# Patient Record
Sex: Female | Born: 1960 | Race: White | Hispanic: No | Marital: Single | Smoking: Former smoker
Health system: Southern US, Community
[De-identification: ages and names within clinical notes are randomized; demographics above are authoritative.]

## PROBLEM LIST (undated history)

## (undated) DIAGNOSIS — F419 Anxiety disorder, unspecified: Secondary | ICD-10-CM

## (undated) DIAGNOSIS — M1990 Unspecified osteoarthritis, unspecified site: Secondary | ICD-10-CM

## (undated) DIAGNOSIS — F32A Depression, unspecified: Secondary | ICD-10-CM

## (undated) DIAGNOSIS — F329 Major depressive disorder, single episode, unspecified: Secondary | ICD-10-CM

## (undated) DIAGNOSIS — I10 Essential (primary) hypertension: Secondary | ICD-10-CM

## (undated) DIAGNOSIS — I639 Cerebral infarction, unspecified: Secondary | ICD-10-CM

## (undated) DIAGNOSIS — I671 Cerebral aneurysm, nonruptured: Secondary | ICD-10-CM

## (undated) DIAGNOSIS — F1020 Alcohol dependence, uncomplicated: Secondary | ICD-10-CM

## (undated) DIAGNOSIS — E785 Hyperlipidemia, unspecified: Secondary | ICD-10-CM

## (undated) HISTORY — DX: Cerebral aneurysm, nonruptured: I67.1

## (undated) HISTORY — DX: Cerebral infarction, unspecified: I63.9

## (undated) HISTORY — DX: Alcohol dependence, uncomplicated: F10.20

## (undated) HISTORY — PX: GYNECOLOGIC CRYOSURGERY: SHX857

## (undated) HISTORY — DX: Unspecified osteoarthritis, unspecified site: M19.90

## (undated) HISTORY — PX: ELBOW FRACTURE SURGERY: SHX616

## (undated) HISTORY — DX: Hyperlipidemia, unspecified: E78.5

## (undated) HISTORY — DX: Essential (primary) hypertension: I10

---

## 1999-02-28 ENCOUNTER — Other Ambulatory Visit: Admission: RE | Admit: 1999-02-28 | Discharge: 1999-02-28 | Payer: Self-pay | Admitting: Obstetrics and Gynecology

## 2000-05-30 ENCOUNTER — Other Ambulatory Visit: Admission: RE | Admit: 2000-05-30 | Discharge: 2000-05-30 | Payer: Self-pay | Admitting: Obstetrics and Gynecology

## 2016-05-20 ENCOUNTER — Emergency Department (HOSPITAL_COMMUNITY): Payer: No Typology Code available for payment source

## 2016-05-20 ENCOUNTER — Encounter (HOSPITAL_COMMUNITY): Payer: Self-pay | Admitting: Emergency Medicine

## 2016-05-20 ENCOUNTER — Emergency Department (HOSPITAL_COMMUNITY)
Admission: EM | Admit: 2016-05-20 | Discharge: 2016-05-20 | Disposition: A | Discharge status: Home / Self Care | Payer: No Typology Code available for payment source | Attending: Emergency Medicine | Admitting: Emergency Medicine

## 2016-05-20 DIAGNOSIS — S8001XA Contusion of right knee, initial encounter: Secondary | ICD-10-CM | POA: Insufficient documentation

## 2016-05-20 DIAGNOSIS — S40211A Abrasion of right shoulder, initial encounter: Secondary | ICD-10-CM | POA: Insufficient documentation

## 2016-05-20 DIAGNOSIS — Y999 Unspecified external cause status: Secondary | ICD-10-CM | POA: Insufficient documentation

## 2016-05-20 DIAGNOSIS — S8991XA Unspecified injury of right lower leg, initial encounter: Secondary | ICD-10-CM | POA: Diagnosis present

## 2016-05-20 DIAGNOSIS — Y9339 Activity, other involving climbing, rappelling and jumping off: Secondary | ICD-10-CM | POA: Insufficient documentation

## 2016-05-20 DIAGNOSIS — S8012XA Contusion of left lower leg, initial encounter: Secondary | ICD-10-CM

## 2016-05-20 DIAGNOSIS — F17200 Nicotine dependence, unspecified, uncomplicated: Secondary | ICD-10-CM | POA: Diagnosis not present

## 2016-05-20 DIAGNOSIS — S7002XA Contusion of left hip, initial encounter: Secondary | ICD-10-CM | POA: Insufficient documentation

## 2016-05-20 DIAGNOSIS — M79604 Pain in right leg: Secondary | ICD-10-CM

## 2016-05-20 DIAGNOSIS — Y92830 Public park as the place of occurrence of the external cause: Secondary | ICD-10-CM | POA: Insufficient documentation

## 2016-05-20 HISTORY — DX: Depression, unspecified: F32.A

## 2016-05-20 HISTORY — DX: Major depressive disorder, single episode, unspecified: F32.9

## 2016-05-20 MED ORDER — IBUPROFEN 200 MG PO TABS
600.0000 mg | ORAL_TABLET | Freq: Once | ORAL | Status: AC
  Administered 2016-05-20: 600 mg via ORAL
  Filled 2016-05-20: qty 1
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
## 2016-05-20 NOTE — Discharge Instructions (Signed)
Follow-up with orthopedics and return to the ER if he develop trouble pain, numbness or worsening symptoms. Elevate as much as possible, ice Tylenol and Motrin for pain.  If you were given medicines take as directed.  If you are on coumadin or contraceptives realize their levels and effectiveness is altered by many different medicines.  If you have any reaction (rash, tongues swelling, other) to the medicines stop taking and see a physician.    If your blood pressure was elevated in the ER make sure you follow up for management with a primary doctor or return for chest pain, shortness of breath or stroke symptoms.  Please follow up as directed and return to the ER or see a physician for new or worsening symptoms.  Thank you. Vitals:   05/20/16 1821 05/20/16 1916 05/20/16 1935  BP: 98/68 107/75 103/62  Pulse: 72 66 75  Resp: 18 14 16   Temp: 98.7 F (37.1 C)    TempSrc: Oral    SpO2: 100% 99% 98%

## 2016-05-20 NOTE — ED Notes (Signed)
Ace bandage applied to R knee.

## 2016-06-25 ENCOUNTER — Ambulatory Visit (HOSPITAL_BASED_OUTPATIENT_CLINIC_OR_DEPARTMENT_OTHER): Payer: Self-pay

## 2016-09-23 ENCOUNTER — Emergency Department (HOSPITAL_COMMUNITY): Payer: Medicaid Other

## 2016-09-23 ENCOUNTER — Inpatient Hospital Stay (HOSPITAL_COMMUNITY)
Admission: EM | Admit: 2016-09-23 | Discharge: 2016-09-26 | DRG: 065 | Disposition: A | Discharge status: Rehabilitation Facility | Payer: Medicaid Other | Attending: Neurology | Admitting: Neurology

## 2016-09-23 ENCOUNTER — Observation Stay (HOSPITAL_COMMUNITY): Payer: Medicaid Other

## 2016-09-23 ENCOUNTER — Encounter (HOSPITAL_COMMUNITY): Payer: Self-pay

## 2016-09-23 DIAGNOSIS — I161 Hypertensive emergency: Secondary | ICD-10-CM | POA: Diagnosis present

## 2016-09-23 DIAGNOSIS — R400 Somnolence: Secondary | ICD-10-CM

## 2016-09-23 DIAGNOSIS — R471 Dysarthria and anarthria: Secondary | ICD-10-CM | POA: Diagnosis present

## 2016-09-23 DIAGNOSIS — F339 Major depressive disorder, recurrent, unspecified: Secondary | ICD-10-CM | POA: Diagnosis present

## 2016-09-23 DIAGNOSIS — S71101D Unspecified open wound, right thigh, subsequent encounter: Secondary | ICD-10-CM

## 2016-09-23 DIAGNOSIS — I619 Nontraumatic intracerebral hemorrhage, unspecified: Secondary | ICD-10-CM | POA: Diagnosis present

## 2016-09-23 DIAGNOSIS — E7800 Pure hypercholesterolemia, unspecified: Secondary | ICD-10-CM | POA: Diagnosis present

## 2016-09-23 DIAGNOSIS — I615 Nontraumatic intracerebral hemorrhage, intraventricular: Principal | ICD-10-CM

## 2016-09-23 DIAGNOSIS — R4701 Aphasia: Secondary | ICD-10-CM | POA: Diagnosis present

## 2016-09-23 DIAGNOSIS — F17210 Nicotine dependence, cigarettes, uncomplicated: Secondary | ICD-10-CM | POA: Diagnosis present

## 2016-09-23 DIAGNOSIS — F1410 Cocaine abuse, uncomplicated: Secondary | ICD-10-CM | POA: Diagnosis present

## 2016-09-23 DIAGNOSIS — I10 Essential (primary) hypertension: Secondary | ICD-10-CM

## 2016-09-23 DIAGNOSIS — Z720 Tobacco use: Secondary | ICD-10-CM | POA: Diagnosis present

## 2016-09-23 DIAGNOSIS — I720 Aneurysm of carotid artery: Secondary | ICD-10-CM | POA: Diagnosis present

## 2016-09-23 DIAGNOSIS — I610 Nontraumatic intracerebral hemorrhage in hemisphere, subcortical: Secondary | ICD-10-CM

## 2016-09-23 DIAGNOSIS — S71109A Unspecified open wound, unspecified thigh, initial encounter: Secondary | ICD-10-CM | POA: Diagnosis present

## 2016-09-23 DIAGNOSIS — G8191 Hemiplegia, unspecified affecting right dominant side: Secondary | ICD-10-CM | POA: Diagnosis present

## 2016-09-23 DIAGNOSIS — F1010 Alcohol abuse, uncomplicated: Secondary | ICD-10-CM | POA: Diagnosis present

## 2016-09-23 DIAGNOSIS — E538 Deficiency of other specified B group vitamins: Secondary | ICD-10-CM | POA: Diagnosis present

## 2016-09-23 DIAGNOSIS — R29810 Facial weakness: Secondary | ICD-10-CM | POA: Diagnosis present

## 2016-09-23 HISTORY — DX: Anxiety disorder, unspecified: F41.9

## 2016-09-23 LAB — COMPREHENSIVE METABOLIC PANEL
ALBUMIN: 3.8 g/dL (ref 3.5–5.0)
ALK PHOS: 83 U/L (ref 38–126)
ALT: 23 U/L (ref 14–54)
AST: 40 U/L (ref 15–41)
Anion gap: 10 (ref 5–15)
BUN: 8 mg/dL (ref 6–20)
CHLORIDE: 100 mmol/L — AB (ref 101–111)
CO2: 26 mmol/L (ref 22–32)
CREATININE: 0.85 mg/dL (ref 0.44–1.00)
Calcium: 9.8 mg/dL (ref 8.9–10.3)
GFR calc non Af Amer: >60 mL/min (ref >60)
GLUCOSE: 89 mg/dL (ref 65–99)
Potassium: 3.5 mmol/L (ref 3.5–5.1)
SODIUM: 136 mmol/L (ref 135–145)
Total Bilirubin: 0.9 mg/dL (ref 0.3–1.2)
Total Protein: 8.1 g/dL (ref 6.5–8.1)

## 2016-09-23 LAB — PROTIME-INR
INR: 0.95
INR: 0.94
PROTHROMBIN TIME: 12.7 s (ref 11.4–15.2)
Prothrombin Time: 12.6 seconds (ref 11.4–15.2)

## 2016-09-23 LAB — URINALYSIS, ROUTINE W REFLEX MICROSCOPIC
BILIRUBIN URINE: NEGATIVE
GLUCOSE, UA: NEGATIVE mg/dL
Ketones, ur: 20 mg/dL — AB
LEUKOCYTES UA: NEGATIVE
NITRITE: NEGATIVE
PH: 6 (ref 5.0–8.0)
Protein, ur: NEGATIVE mg/dL
SPECIFIC GRAVITY, URINE: 1.013 (ref 1.005–1.030)

## 2016-09-23 LAB — I-STAT CHEM 8, ED
BUN: 9 mg/dL (ref 6–20)
CALCIUM ION: 1.19 mmol/L (ref 1.15–1.40)
CHLORIDE: 103 mmol/L (ref 101–111)
Creatinine, Ser: 0.8 mg/dL (ref 0.44–1.00)
Glucose, Bld: 96 mg/dL (ref 65–99)
HCT: 46 % (ref 36.0–46.0)
Hemoglobin: 15.6 g/dL — ABNORMAL HIGH (ref 12.0–15.0)
POTASSIUM: 3.6 mmol/L (ref 3.5–5.1)
SODIUM: 139 mmol/L (ref 135–145)
TCO2: 25 mmol/L (ref 0–100)

## 2016-09-23 LAB — CBC
HCT: 43.5 % (ref 36.0–46.0)
Hemoglobin: 14.8 g/dL (ref 12.0–15.0)
MCH: 32.9 pg (ref 26.0–34.0)
MCHC: 34 g/dL (ref 30.0–36.0)
MCV: 96.7 fL (ref 78.0–100.0)
PLATELETS: 294 10*3/uL (ref 150–400)
RBC: 4.5 MIL/uL (ref 3.87–5.11)
RDW: 14.2 % (ref 11.5–15.5)
WBC: 8.5 10*3/uL (ref 4.0–10.5)

## 2016-09-23 LAB — I-STAT TROPONIN, ED: Troponin i, poc: 0.01 ng/mL (ref 0.00–0.08)

## 2016-09-23 LAB — DIFFERENTIAL
BASOS ABS: 0 10*3/uL (ref 0.0–0.1)
BASOS PCT: 0 %
Eosinophils Absolute: 0.1 10*3/uL (ref 0.0–0.7)
Eosinophils Relative: 1 %
LYMPHS PCT: 25 %
Lymphs Abs: 2.1 10*3/uL (ref 0.7–4.0)
MONO ABS: 0.7 10*3/uL (ref 0.1–1.0)
Monocytes Relative: 8 %
NEUTROS ABS: 5.6 10*3/uL (ref 1.7–7.7)
NEUTROS PCT: 66 %

## 2016-09-23 LAB — RAPID URINE DRUG SCREEN, HOSP PERFORMED
AMPHETAMINES: NOT DETECTED
BARBITURATES: NOT DETECTED
BENZODIAZEPINES: NOT DETECTED
Cocaine: POSITIVE — AB
OPIATES: NOT DETECTED
Tetrahydrocannabinol: NOT DETECTED

## 2016-09-23 LAB — ETHANOL

## 2016-09-23 LAB — APTT
APTT: 32 s (ref 24–36)
APTT: 33 s (ref 24–36)

## 2016-09-23 MED ORDER — ACETAMINOPHEN 160 MG/5ML PO SOLN: 650.0000 mg | ORAL | Status: DC | PRN

## 2016-09-23 MED ORDER — SENNOSIDES-DOCUSATE SODIUM 8.6-50 MG PO TABS
1.0000 | ORAL_TABLET | Freq: Two times a day (BID) | ORAL | Status: DC
  Administered 2016-09-24 – 2016-09-26 (×5): 1 via ORAL
  Filled 2016-09-23 (×5): qty 1

## 2016-09-23 MED ORDER — ACETAMINOPHEN 650 MG RE SUPP: 650.0000 mg | RECTAL | Status: DC | PRN

## 2016-09-23 MED ORDER — PANTOPRAZOLE SODIUM 40 MG IV SOLR
40.0000 mg | Freq: Every day | INTRAVENOUS | Status: DC
  Administered 2016-09-23: 40 mg via INTRAVENOUS
  Filled 2016-09-23: qty 40

## 2016-09-23 MED ORDER — ACETAMINOPHEN 325 MG PO TABS
650.0000 mg | ORAL_TABLET | ORAL | Status: DC | PRN
  Administered 2016-09-25: 650 mg via ORAL
  Filled 2016-09-23: qty 2

## 2016-09-23 MED ORDER — CLEVIDIPINE BUTYRATE 0.5 MG/ML IV EMUL
0.0000 mg/h | INTRAVENOUS | Status: DC
  Administered 2016-09-23 – 2016-09-24 (×3): 2 mg/h via INTRAVENOUS
  Filled 2016-09-23 (×2): qty 50

## 2016-09-23 MED ORDER — STROKE: EARLY STAGES OF RECOVERY BOOK
Freq: Once | Status: AC
  Administered 2016-09-24
  Filled 2016-09-23 (×2): qty 1

## 2016-09-23 NOTE — Consult Note (Signed)
NEURO HOSPITALIST CONSULT NOTE   Requestig physician: Dr. Fredderick PhenixBelfi  Reason for Consult: Left thalamic ICH with intraventricular extension  History obtained from:  Patient and Chart     HPI:                                                                                                                                          Kelsey Smith is an 55 y.o. female who presents with acute left thalamic ICH.   ED notes reviewed, which agree with the history I obtained from the patient: "Her roommate states that she was last seen normal last night around 11:30 PM. One of the other roommates heard her fall during the night and helped her back to bed. This morning her roommate found her on the floor. She wasn't acting normally and was having difficulty with her speech. He brought her in for evaluation. She is having some difficulty with her speech but does seem oriented. She denies any injuries from the fall. She denies any neck or back pain. She is not on anticoagulants."  Other symptoms include headache and aphasia. LSN was 23:30 last night. She states her headache and weakness started before she fell. She was drinking heavily prior to the onset of headache and weakness; estimated total per day is the equivalent of 8 beers. She also smokes.   Past Medical History:  Diagnosis Date  . Depression     History reviewed. No pertinent surgical history.  No family history on file.  Social History:  reports that she has been smoking.  She has never used smokeless tobacco. She reports that she drinks alcohol. She reports that she does not use drugs.  No Known Allergies  MEDICATIONS:                                                                                                                     busPIRone (BUSPAR) 10 MG tablet Take 10 mg by mouth every morning. Historical Provider, MD Needs Review  ibuprofen (ADVIL,MOTRIN) 200 MG tablet Take 800 mg by mouth every 6 (six) hours as  needed for headache or mild pain. Historical Provider, MD Needs Review  PARoxetine (PAXIL) 20 MG tablet Take 20 mg by mouth every morning.  Historical Provider, MD Needs Review  traZODone (DESYREL) 50 MG tablet Take 50 mg by mouth at bedtime as needed for sleep. Historical Provider, MD Needs Review    ROS:                                                                                                                                       History obtained from patient. No fever, chills, cough, chest pain or back pain.   Blood pressure (!) 182/104, pulse 84, temperature 97.7 F (36.5 C), temperature source Oral, resp. rate 18, height 5\' 1"  (1.549 m), weight 47.6 kg (105 lb), SpO2 100 %.  General Examination:                                                                                                      HEENT-  Normocephalic.   Lungs- Respirations unlabored. No gross wheezing.  Extremities- No edema. 2 cm wide oval eschar with surrounding erythema on medial aspect of right knee  Neurological Examination Mental Status: Drowsy. Speech with receptive and expressive dysphasia. Able to repeat normally. Has significant difficulty with naming, comprehension and fluency.  Cranial Nerves: II: Visual fields grossly intact on left and right to single stimuli; extinction on the right with double simultaneous stimulation.   III,IV, VI: Mild right sided ptosis, extra-ocular motions full horizontally and vertically with saccadic quality noted V,VII: Mild right facial droop. Patient equivocal regarding facial temp sensation. VIII: hearing intact to conversation IX,X: no hypophonia XI: lag on right XII: Does not cooperate Motor: Right : Upper extremity   4-/5 and slow to respond  Left:     Upper extremity   5/5  Lower extremity   4+/5     Lower extremity   5/5 Sensory: Decreased sensation on the right.  Deep Tendon Reflexes: 2-3+ and symmetric throughout Plantars: Equivocal bilaterally  Cerebellar:  Dystaxic with right FNF Gait:  Deferred   Lab Results: Basic Metabolic Panel:  Recent Labs Lab 09/23/16 1405 09/23/16 1423  NA 136 139  K 3.5 3.6  CL 100* 103  CO2 26  --   GLUCOSE 89 96  BUN 8 9  CREATININE 0.85 0.80  CALCIUM 9.8  --     Liver Function Tests:  Recent Labs Lab 09/23/16 1405  AST 40  ALT 23  ALKPHOS 83  BILITOT 0.9  PROT 8.1  ALBUMIN 3.8   No results for input(s): LIPASE, AMYLASE in the last 168 hours. No results for input(s): AMMONIA in the last 168 hours.  CBC:  Recent Labs Lab 09/23/16 1405 09/23/16 1423  WBC 8.5  --   NEUTROABS 5.6  --   HGB 14.8 15.6*  HCT 43.5 46.0  MCV 96.7  --   PLT 294  --     Cardiac Enzymes: No results for input(s): CKTOTAL, CKMB, CKMBINDEX, TROPONINI in the last 168 hours.  Lipid Panel: No results for input(s): CHOL, TRIG, HDL, CHOLHDL, VLDL, LDLCALC in the last 168 hours.  CBG: No results for input(s): GLUCAP in the last 168 hours.  Microbiology: No results found for this or any previous visit.  Coagulation Studies:  Recent Labs  09/23/16 1405  LABPROT 12.7  INR 0.95    Imaging: Ct Head Wo Contrast  Result Date: 09/23/2016 CLINICAL DATA:  Larey SeatFell with head trauma, slurred speech and confusion. EXAM: CT HEAD WITHOUT CONTRAST TECHNIQUE: Contiguous axial images were obtained from the base of the skull through the vertex without intravenous contrast. COMPARISON:  None. FINDINGS: Brain: Acute intraparenchymal hemorrhage with the epicenter in the left thalamus measuring 3.5 x 2.9 x 2.9 cm (volume = 15 cm^3). Intraventricular penetration into the left lateral ventricle with subtotal filling of that ventricle and some blood extending into the third and fourth ventricles. Mass effect with left-to-right shift of 3 mm. No evidence of obstructive hydrocephalus. No extra-axial collection. No other abnormal brain parenchymal finding. Vascular:  No abnormal finding at the base of the brain. Skull: Negative  Sinuses/Orbits: Chronic inflammatory changes of the left maxillary sinus. Other: None IMPRESSION: Acute intraparenchymal hemorrhage in the left thalamus with a calculated volume of 15 cc. Intraventricular penetration. Mass effect with 3 mm of left-to-right shift. Critical Value/emergent results were called by telephone at the time of interpretation on 09/23/2016 at 3:00 pm to Dr. Shawna OrleansMELANIE BELFI , who verbally acknowledged these results. Electronically Signed   By: Paulina FusiMark  Shogry M.D.   On: 09/23/2016 15:01   Impression:  Left thalamic ICH with intraventricular extension. 1. Most likely hypertensive. Also possible would be a hemorrhagic underlying lesion.  2. Severe HTN currently. No prior diagnosis of such.  3. Depression.  4. Heavy chronic EtOH use estimated at eight 12 oz beer equivalents per day. 5. ICH score = 1.  Recommendations: 1. Admit to ICU under Neurology service.  2. No antiplatelet medications or anticoagulants. DVT prophylaxis with SCDs. 3. Frequent neuro checks. If she deteriorates, may need Neurosurgery evaluation and/or hot salts. 4. CIWA protocol. Will need scheduled benzo for 2 days, then lower dose x 1 day then PRN.  5. Repeat CT in 24 hours.  6. BP management. SBP goal < 160.  7. MRI brain with and without contrast to evaluate for possible underlying lesion.  8. MRA of head.   Electronically signed: Dr. Caryl PinaEric Cora Brierley 09/23/2016, 4:10 PM

## 2016-09-23 NOTE — ED Notes (Signed)
Contacted Neuro MD about admission orders. Will be placed at this time.

## 2016-09-23 NOTE — ED Provider Notes (Signed)
[facility]-EMERGENCY DEPT Provider Note   CSN: [id number] Arrival date & time: 09/23/16  1330     History   Chief Complaint Chief Complaint  Patient presents with  . Altered Mental Status    HPI Kelsey Smith is a 55 y.o. female.  Patient is a 55 year old female with a history of depression who presents with altered mental status. Her roommate states that she was last seen normal last night around 11:30 PM. One of the other roommates heard her fall during the night and helped her back to bed. This morning her roommate found her on the floor. She wasn't acting normally and was having difficulty with her speech. He brought her in for evaluation. She is having some difficulty with her speech but does seem oriented. She denies any injuries from the fall. She denies any neck or back pain. She is not on anticoagulants.      Past Medical History:  Diagnosis Date  . Depression     There are no active problems to display for this patient.   History reviewed. No pertinent surgical history.  OB History    No data available       Home Medications    Prior to Admission medications   Medication Sig Start Date End Date Taking? Authorizing Provider  busPIRone (BUSPAR) 10 MG tablet Take 10 mg by mouth every morning.   Yes Historical Provider, MD  ibuprofen (ADVIL,MOTRIN) 200 MG tablet Take 800 mg by mouth every 6 (six) hours as needed for headache or mild pain.   Yes Historical Provider, MD  PARoxetine (PAXIL) 20 MG tablet Take 20 mg by mouth every morning.    Yes Historical Provider, MD  traZODone (DESYREL) 50 MG tablet Take 50 mg by mouth at bedtime as needed for sleep.   Yes Historical Provider, MD    Family History No family history on file.  Social History Social History  Substance Use Topics  . Smoking status: Current Every Day Smoker  . Smokeless tobacco: Never Used  . Alcohol use Yes     Allergies   Patient has no known allergies.   Review of Systems Review of  Systems  Constitutional: Negative for chills, diaphoresis, fatigue and fever.  HENT: Negative for congestion, rhinorrhea and sneezing.   Eyes: Negative.   Respiratory: Negative for cough, chest tightness and shortness of breath.   Cardiovascular: Negative for chest pain and leg swelling.  Gastrointestinal: Negative for abdominal pain, blood in stool, diarrhea, nausea and vomiting.  Genitourinary: Negative for difficulty urinating, flank pain, frequency and hematuria.  Musculoskeletal: Negative for arthralgias and back pain.  Skin: Negative for rash.  Neurological: Positive for speech difficulty, weakness and headaches. Negative for dizziness and numbness.     Physical Exam Updated Vital Signs BP (!) 182/104 (BP Location: Right Arm)   Pulse 84   Temp 97.7 F (36.5 C) (Oral)   Resp 18   Ht 5\' 1"  (1.549 m)   Wt 105 lb (47.6 kg)   SpO2 100%   BMI 19.84 kg/m   Physical Exam  Constitutional: She is oriented to person, place, and time. She appears well-developed and well-nourished.  HENT:  Head: Normocephalic and atraumatic.  Eyes: Pupils are equal, round, and reactive to light.  Neck: Normal range of motion. Neck supple.  Cardiovascular: Normal rate, regular rhythm and normal heart sounds.   Pulmonary/Chest: Effort normal and breath sounds normal. No respiratory distress. She has no wheezes. She has no rales. She exhibits no tenderness.  Abdominal: Soft. Bowel sounds are normal. There is no tenderness. There is no rebound and no guarding.  Musculoskeletal: Normal range of motion. She exhibits no edema.  Lymphadenopathy:    She has no cervical adenopathy.  Neurological: She is alert and oriented to person, place, and time.  Patient is oriented to person place and month. She appears to have an expressive aphasia. She has difficulty with certain phrases and words. She doesn't have any definite slurred speech. No facial drooping. She does have some weakness and drift of her right arm  and right leg as compared to the left. She has difficulty with finger to nose on the right. It's normal on the left.  Skin: Skin is warm and dry. No rash noted.  Psychiatric: She has a normal mood and affect.     ED Treatments / Results  Labs (all labs ordered are listed, but only abnormal results are displayed) Labs Reviewed  COMPREHENSIVE METABOLIC PANEL - Abnormal; Notable for the following:       Result Value   Chloride 100 (*)    All other components within normal limits  I-STAT CHEM 8, ED - Abnormal; Notable for the following:    Hemoglobin 15.6 (*)    All other components within normal limits  ETHANOL  PROTIME-INR  APTT  CBC  DIFFERENTIAL  RAPID URINE DRUG SCREEN, HOSP PERFORMED  URINALYSIS, ROUTINE W REFLEX MICROSCOPIC  I-STAT TROPOININ, ED    EKG  EKG Interpretation  Date/Time:  Sunday September 23 2016 13:40:19 EST Ventricular Rate:  72 PR Interval:    QRS Duration: 98 QT Interval:  459 QTC Calculation: 503 R Axis:   63 Text Interpretation:  Sinus rhythm LAE, consider biatrial enlargement LVH with secondary repolarization abnormality Borderline prolonged QT interval No old tracing to compare Confirmed by Dessie Delcarlo  MD, Posey Petrik ([id number]) on 09/23/2016 3:14:00 PM       Radiology Ct Head Wo Contrast  Result Date: 09/23/2016 CLINICAL DATA:  Larey Seat with head trauma, slurred speech and confusion. EXAM: CT HEAD WITHOUT CONTRAST TECHNIQUE: Contiguous axial images were obtained from the base of the skull through the vertex without intravenous contrast. COMPARISON:  None. FINDINGS: Brain: Acute intraparenchymal hemorrhage with the epicenter in the left thalamus measuring 3.5 x 2.9 x 2.9 cm (volume = 15 cm^3). Intraventricular penetration into the left lateral ventricle with subtotal filling of that ventricle and some blood extending into the third and fourth ventricles. Mass effect with left-to-right shift of 3 mm. No evidence of obstructive hydrocephalus. No extra-axial  collection. No other abnormal brain parenchymal finding. Vascular:  No abnormal finding at the base of the brain. Skull: Negative Sinuses/Orbits: Chronic inflammatory changes of the left maxillary sinus. Other: None IMPRESSION: Acute intraparenchymal hemorrhage in the left thalamus with a calculated volume of 15 cc. Intraventricular penetration. Mass effect with 3 mm of left-to-right shift. Critical Value/emergent results were called by telephone at the time of interpretation on 09/23/2016 at 3:00 pm to Dr. Shawna Orleans Vedder Brittian , who verbally acknowledged these results. Electronically Signed   By: Paulina Fusi M.D.   On: 09/23/2016 15:01    Procedures Procedures (including critical care time)  Medications Ordered in ED Medications - No data to display   Initial Impression / Assessment and Plan / ED Course  I have reviewed the triage vital signs and the nursing notes.  Pertinent labs & imaging results that were available during my care of the patient were reviewed by me and considered in my medical decision making (see  chart for details).  Clinical Course as of Sep 23 1542  Wynelle LinkSun Sep 23, 2016  1514 PT with parenchymal hemorrhage in the left thalamus. I spoke with the neurosurgeon, Dr. Conchita ParisNundkumar, who has reviewed the scans and feels that the patient should be managed by neurology.  [MB]    Clinical Course User Index [MB] Rolan BuccoMelanie Brad Mcgaughy, MD    I spoke with Dr. Otelia LimesLindzen with neurology who will see the pt.  CRITICAL CARE Performed by: Magaly Pollina Total critical care time: 60 minutes Critical care time was exclusive of separately billable procedures and treating other patients. Critical care was necessary to treat or prevent imminent or life-threatening deterioration. Critical care was time spent personally by me on the following activities: development of treatment plan with patient and/or surrogate as well as nursing, discussions with consultants, evaluation of patient's response to treatment,  examination of patient, obtaining history from patient or surrogate, ordering and performing treatments and interventions, ordering and review of laboratory studies, ordering and review of radiographic studies, pulse oximetry and re-evaluation of patient's condition.   Final Clinical Impressions(s) / ED Diagnoses   Final diagnoses:  Intraparenchymal hemorrhage of brain [facility](HCC)    New Prescriptions New Prescriptions   No medications on file     Rolan BuccoMelanie Cleo Santucci, MD 09/23/16 1544

## 2016-09-23 NOTE — ED Notes (Signed)
Spoke with Admitting MD. MRI to be done tomorrow

## 2016-09-23 NOTE — ED Notes (Signed)
Neurology at bedside.  Main pharmacy reports they have sent cleviprex. RN waiting on arrival.

## 2016-09-23 NOTE — ED Triage Notes (Signed)
Pt brought in by EMS with c/o asphasia and headache. Pt fell in bathroom sometime during the night. Per EMS pt was found by roommates this morning. LSN was 21:30 last night. Pt Pt is a&ox3. Pt denies any weakness or sensory deficits.

## 2016-09-23 NOTE — ED Notes (Signed)
ICU RN states they wil Complete Stroke swallow screen tomorrow

## 2016-09-24 ENCOUNTER — Observation Stay (HOSPITAL_COMMUNITY): Payer: Medicaid Other

## 2016-09-24 DIAGNOSIS — S71101D Unspecified open wound, right thigh, subsequent encounter: Secondary | ICD-10-CM | POA: Diagnosis not present

## 2016-09-24 DIAGNOSIS — R51 Headache: Secondary | ICD-10-CM | POA: Diagnosis present

## 2016-09-24 DIAGNOSIS — I720 Aneurysm of carotid artery: Secondary | ICD-10-CM | POA: Diagnosis present

## 2016-09-24 DIAGNOSIS — F17210 Nicotine dependence, cigarettes, uncomplicated: Secondary | ICD-10-CM | POA: Diagnosis present

## 2016-09-24 DIAGNOSIS — E7800 Pure hypercholesterolemia, unspecified: Secondary | ICD-10-CM | POA: Diagnosis present

## 2016-09-24 DIAGNOSIS — E538 Deficiency of other specified B group vitamins: Secondary | ICD-10-CM

## 2016-09-24 DIAGNOSIS — F1410 Cocaine abuse, uncomplicated: Secondary | ICD-10-CM | POA: Diagnosis present

## 2016-09-24 DIAGNOSIS — F17200 Nicotine dependence, unspecified, uncomplicated: Secondary | ICD-10-CM

## 2016-09-24 DIAGNOSIS — I615 Nontraumatic intracerebral hemorrhage, intraventricular: Principal | ICD-10-CM

## 2016-09-24 DIAGNOSIS — R29810 Facial weakness: Secondary | ICD-10-CM | POA: Diagnosis present

## 2016-09-24 DIAGNOSIS — R4701 Aphasia: Secondary | ICD-10-CM | POA: Diagnosis present

## 2016-09-24 DIAGNOSIS — F339 Major depressive disorder, recurrent, unspecified: Secondary | ICD-10-CM | POA: Diagnosis present

## 2016-09-24 DIAGNOSIS — F1010 Alcohol abuse, uncomplicated: Secondary | ICD-10-CM | POA: Diagnosis present

## 2016-09-24 DIAGNOSIS — G8191 Hemiplegia, unspecified affecting right dominant side: Secondary | ICD-10-CM | POA: Diagnosis present

## 2016-09-24 DIAGNOSIS — I161 Hypertensive emergency: Secondary | ICD-10-CM | POA: Diagnosis present

## 2016-09-24 DIAGNOSIS — R471 Dysarthria and anarthria: Secondary | ICD-10-CM | POA: Diagnosis present

## 2016-09-24 LAB — BASIC METABOLIC PANEL
Anion gap: 12 (ref 5–15)
BUN: 15 mg/dL (ref 6–20)
CHLORIDE: 104 mmol/L (ref 101–111)
CO2: 22 mmol/L (ref 22–32)
CREATININE: 0.99 mg/dL (ref 0.44–1.00)
Calcium: 9.7 mg/dL (ref 8.9–10.3)
GFR calc Af Amer: >60 mL/min (ref >60)
GFR calc non Af Amer: >60 mL/min (ref >60)
GLUCOSE: 90 mg/dL (ref 65–99)
Potassium: 3.5 mmol/L (ref 3.5–5.1)
Sodium: 138 mmol/L (ref 135–145)

## 2016-09-24 LAB — MRSA PCR SCREENING: MRSA by PCR: NEGATIVE

## 2016-09-24 LAB — LIPID PANEL
Cholesterol: 216 mg/dL — ABNORMAL HIGH (ref 0–200)
HDL: 79 mg/dL (ref >40)
LDL CALC: 109 mg/dL — AB (ref 0–99)
TRIGLYCERIDES: 139 mg/dL (ref <150)
Total CHOL/HDL Ratio: 2.7 RATIO
VLDL: 28 mg/dL (ref 0–40)

## 2016-09-24 LAB — FOLATE: Folate: 25 ng/mL (ref >5.9)

## 2016-09-24 LAB — TSH: TSH: 0.665 u[IU]/mL (ref 0.350–4.500)

## 2016-09-24 LAB — CBC
HEMATOCRIT: 42.8 % (ref 36.0–46.0)
HEMOGLOBIN: 14.5 g/dL (ref 12.0–15.0)
MCH: 33.1 pg (ref 26.0–34.0)
MCHC: 33.9 g/dL (ref 30.0–36.0)
MCV: 97.7 fL (ref 78.0–100.0)
Platelets: 294 10*3/uL (ref 150–400)
RBC: 4.38 MIL/uL (ref 3.87–5.11)
RDW: 14.4 % (ref 11.5–15.5)
WBC: 7.6 10*3/uL (ref 4.0–10.5)

## 2016-09-24 LAB — RPR: RPR: NONREACTIVE

## 2016-09-24 LAB — VITAMIN B12: Vitamin B-12: 140 pg/mL — ABNORMAL LOW (ref 180–914)

## 2016-09-24 LAB — HIV ANTIBODY (ROUTINE TESTING W REFLEX): HIV Screen 4th Generation wRfx: NONREACTIVE

## 2016-09-24 MED ORDER — LABETALOL HCL 5 MG/ML IV SOLN: 10.0000 mg | INTRAVENOUS | Status: DC | PRN

## 2016-09-24 MED ORDER — VITAMIN B-1 100 MG PO TABS
100.0000 mg | ORAL_TABLET | Freq: Every day | ORAL | Status: DC
  Administered 2016-09-24 – 2016-09-26 (×3): 100 mg via ORAL
  Filled 2016-09-24 (×3): qty 1

## 2016-09-24 MED ORDER — LISINOPRIL 20 MG PO TABS
20.0000 mg | ORAL_TABLET | Freq: Every day | ORAL | Status: DC
  Administered 2016-09-24 – 2016-09-26 (×3): 20 mg via ORAL
  Filled 2016-09-24 (×3): qty 1

## 2016-09-24 MED ORDER — PAROXETINE HCL 20 MG PO TABS
20.0000 mg | ORAL_TABLET | Freq: Every morning | ORAL | Status: DC
  Administered 2016-09-24 – 2016-09-26 (×3): 20 mg via ORAL
  Filled 2016-09-24 (×3): qty 1

## 2016-09-24 MED ORDER — BUSPIRONE HCL 10 MG PO TABS
10.0000 mg | ORAL_TABLET | Freq: Every morning | ORAL | Status: DC
  Administered 2016-09-24 – 2016-09-26 (×3): 10 mg via ORAL
  Filled 2016-09-24 (×3): qty 1

## 2016-09-24 MED ORDER — VITAMIN B-12 1000 MCG PO TABS
1000.0000 ug | ORAL_TABLET | Freq: Every day | ORAL | Status: DC
  Administered 2016-09-25 – 2016-09-26 (×2): 1000 ug via ORAL
  Filled 2016-09-24 (×2): qty 1

## 2016-09-24 MED ORDER — THIAMINE HCL 100 MG/ML IJ SOLN: 100.0000 mg | Freq: Every day | INTRAMUSCULAR | Status: DC

## 2016-09-24 MED ORDER — FOLIC ACID 1 MG PO TABS
1.0000 mg | ORAL_TABLET | Freq: Every day | ORAL | Status: DC
  Administered 2016-09-24 – 2016-09-26 (×3): 1 mg via ORAL
  Filled 2016-09-24 (×3): qty 1

## 2016-09-24 MED ORDER — CYANOCOBALAMIN 1000 MCG/ML IJ SOLN
1000.0000 ug | Freq: Once | INTRAMUSCULAR | Status: AC
  Administered 2016-09-24: 1000 ug via INTRAMUSCULAR
  Filled 2016-09-24: qty 1

## 2016-09-24 MED ORDER — LORAZEPAM 2 MG/ML IJ SOLN: 1.0000 mg | Freq: Four times a day (QID) | INTRAMUSCULAR | Status: DC | PRN

## 2016-09-24 MED ORDER — ADULT MULTIVITAMIN W/MINERALS CH
1.0000 | ORAL_TABLET | Freq: Every day | ORAL | Status: DC
  Administered 2016-09-25 – 2016-09-26 (×2): 1 via ORAL
  Filled 2016-09-24 (×2): qty 1

## 2016-09-24 MED ORDER — SODIUM CHLORIDE 0.9 % IV SOLN
INTRAVENOUS | Status: DC
  Administered 2016-09-24 – 2016-09-25 (×2): via INTRAVENOUS

## 2016-09-24 MED ORDER — LORAZEPAM 1 MG PO TABS: 1.0000 mg | ORAL_TABLET | Freq: Four times a day (QID) | ORAL | Status: DC | PRN

## 2016-09-24 MED ORDER — PANTOPRAZOLE SODIUM 40 MG PO TBEC
40.0000 mg | DELAYED_RELEASE_TABLET | Freq: Every day | ORAL | Status: DC
  Administered 2016-09-24 – 2016-09-26 (×3): 40 mg via ORAL
  Filled 2016-09-24 (×3): qty 1

## 2016-09-24 MED ORDER — GADOBENATE DIMEGLUMINE 529 MG/ML IV SOLN
10.0000 mL | Freq: Once | INTRAVENOUS | Status: AC
  Administered 2016-09-24: 10 mL via INTRAVENOUS

## 2016-09-24 NOTE — Progress Notes (Signed)
OT Cancellation Note  Patient Details Name: Kelsey Smith MRN: [id number] DOB: [birth year]   Cancelled Treatment:    Reason Eval/Treat Not Completed: Patient at procedure or test/ unavailable;Patient not medically ready. Pt currently off the unit and with active bedrest orders. Please d/c bedrest orders/update activity orders if pt medically appropriate for therapy. Will follow up for OT eval as time allows.  Gaye AlkenBailey A Yarelin Reichardt M.S., OTR/L Pager: [phone]  09/24/2016, 8:43 AM
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
## 2016-09-24 NOTE — Progress Notes (Signed)
STROKE TEAM PROGRESS NOTE   HISTORY OF PRESENT ILLNESS (per record) Kelsey Smith is an 56 y.o. female who presents with acute left thalamic ICH.   ED notes reviewed, which agree with the history I obtained from the patient: "Her roommate states that she was last seen normal last night around 11:30 PM. One of the other roommates heard her fall during the night and helped her back to bed. This morning her roommate found her on the floor. She wasn't acting normally and was having difficulty with her speech. He brought her in for evaluation. She is having some difficulty with her speech but does seem oriented. She denies any injuries from the fall. She denies any neck or back pain. She is not on anticoagulants."  Other symptoms include headache and aphasia. LSN was 23:30 last night 09/22/2016. She states her headache and weakness started before she fell. She was drinking heavily prior to the onset of headache and weakness; estimated total per day is the equivalent of 8 beers. She also smokes.   Patient was not administered IV t-PA secondary to ICH. She was admitted to the neuro ICU for further evaluation and treatment.   SUBJECTIVE (INTERVAL HISTORY) No family at bedside. Pt sleepy but easily arousable and following commands. Still has some expressive aphasia and right hemiparesis.    OBJECTIVE Temp:  [97.7 F (36.5 C)-98.7 F (37.1 C)] 98.7 F (37.1 C) (01/01 0400) Pulse Rate:  [70-90] 74 (01/01 0845) Cardiac Rhythm: Normal sinus rhythm (01/01 0800) Resp:  [11-20] 14 (01/01 0845) BP: (119-182)/(75-120) 154/95 (01/01 0845) SpO2:  [95 %-100 %] 97 % (01/01 0845) Weight:  [47.1 kg (103 lb 13.4 oz)-47.6 kg (105 lb)] 47.1 kg (103 lb 13.4 oz) (01/01 0000)  CBC:  Recent Labs Lab 09/23/16 1405 09/23/16 1423 09/24/16 0716  WBC 8.5  --  7.6  NEUTROABS 5.6  --   --   HGB 14.8 15.6* 14.5  HCT 43.5 46.0 42.8  MCV 96.7  --  97.7  PLT 294  --  294    Basic Metabolic Panel:  Recent  Labs Lab 09/23/16 1405 09/23/16 1423 09/24/16 0716  NA 136 139 138  K 3.5 3.6 3.5  CL 100* 103 104  CO2 26  --  22  GLUCOSE 89 96 90  BUN 8 9 15   CREATININE 0.85 0.80 0.99  CALCIUM 9.8  --  9.7    Lipid Panel:    Component Value Date/Time   CHOL 216 (H) 09/24/2016 0716   TRIG 139 09/24/2016 0716   HDL 79 09/24/2016 0716   CHOLHDL 2.7 09/24/2016 0716   VLDL 28 09/24/2016 0716   LDLCALC 109 (H) 09/24/2016 0716   HgbA1c: No results found for: HGBA1C Urine Drug Screen:    Component Value Date/Time   LABOPIA NONE DETECTED 09/23/2016 1513   COCAINSCRNUR POSITIVE (A) 09/23/2016 1513   LABBENZ NONE DETECTED 09/23/2016 1513   AMPHETMU NONE DETECTED 09/23/2016 1513   THCU NONE DETECTED 09/23/2016 1513   LABBARB NONE DETECTED 09/23/2016 1513      IMAGING I have personally reviewed the radiological images below and agree with the radiology interpretations.  Ct Head Wo Contrast 09/23/2016 Acute intraparenchymal hemorrhage in the left thalamus with a calculated volume of 15 cc. Intraventricular penetration. Mass effect with 3 mm of left-to-right shift.   Mri and Mra Head Wo Contrast 09/24/2016 IMPRESSION: 1. MRA is negative in the region of the left thalamic hemorrhage, but positive for a 5 mm Left ICA Aneurysm (  superior hypophyseal type, directed medially). Recommend Neuro endovascular consultation. 2. Stable left thalamic hemorrhage with estimated blood volume of 15 mm. Surrounding edema and mild regional mass effect. 3. Intraventricular extension of hemorrhage without ventriculomegaly. 4. Underlying mildly to moderately age advanced nonspecific white matter signal changes, favor small vessel disease related. 5. No intracranial stenosis or circle of Willis branch occlusion.  TTE pending   PHYSICAL EXAM  Temp:  [97.5 F (36.4 C)-98.7 F (37.1 C)] 97.5 F (36.4 C) (01/01 0800) Pulse Rate:  [70-90] 73 (01/01 1100) Resp:  [11-20] 14 (01/01 1100) BP: (119-182)/(75-120) 139/101  (01/01 1100) SpO2:  [95 %-100 %] 98 % (01/01 1100) Weight:  [103 lb 13.4 oz (47.1 kg)-105 lb (47.6 kg)] 103 lb 13.4 oz (47.1 kg) (01/01 0000)  General - Well nourished, well developed, mildly sleepy.  Ophthalmologic - Fundi not visualized due to noncooperation.  Cardiovascular - Regular rate and rhythm.  Mental Status -  Level of arousal and orientation to place, and person were intact, but not orientated to time. Language including repetition, comprehension was assessed and found intact, however, naming 1/2 and mild expressive aphasia.  Cranial Nerves II - XII - II - Visual field intact OU. III, IV, VI - Extraocular movements intact. V - Facial sensation intact bilaterally. VII - right mild nasolabial fold flattening. VIII - Hearing & vestibular intact bilaterally. X - Palate elevates symmetrically, mild dysarthria. XI - Chin turning & shoulder shrug intact bilaterally. XII - Tongue protrusion intact.  Motor Strength - The patient's strength was normal LUE and LLE, but 4/5 RUE and RLE and pronator drift was present.  Bulk was normal and fasciculations were absent.   Motor Tone - Muscle tone was assessed at the neck and appendages and was normal.  Reflexes - The patient's reflexes were 1+ in all extremities and she had no pathological reflexes.  Sensory - Light touch, temperature/pinprick were assessed and were symmetrical.    Coordination - The patient had normal movements in the left hand with no ataxia or dysmetria.  Tremor was absent.  Gait and Station - deferred.   ASSESSMENT/PLAN [name] is a 56 y.o. female with history of depression presenting with speech difficulty, HA and weakness. She did not receive IV t-PA due to ICH.   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 No AVM at left thalamus  2D Echo  pending   LDL 109  HgbA1c pending  VitB12 140  SCDs for VTE prophylaxis  Diet NPO time specified  No antithrombotic prior to admission.   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  outpt follow up   B12 deficiency  B12 140  B12 supplement with IM and PO  Alcohol abuse  Heavy alcohol use at home  CIWA protocol  B1, FA and multivitamine  Hypertensive Emergency  BP as high as 169/118 on arrival  off Cleviprex  On lisinopril  BP goal < 160  Long-term BP goal normotensive  Tobacco abuse  Current smoker  Smoking cessation counseling provided  Pt is willing to quit  Other Stroke Risk Factors    Other Active Problems  Depression on paxil,  buspar and desyrel  Hospital day # 0  This patient is critically ill due to left ICH with IVH, hypertension, and alcohol abuse and at significant risk of neurological worsening, death form recurrent ICH, hematoma extension, hypertensive emergency, DT. This patient's care requires constant monitoring of vital signs, hemodynamics, respiratory and cardiac monitoring, review of multiple databases, neurological assessment, discussion with family, other specialists and medical decision making of high complexity. I spent 35 minutes of neurocritical care time in the care of this patient.  Marvel Plan, MD PhD Stroke Neurology 09/24/2016 7:00 PM   To contact Stroke Continuity provider, please refer to [email or web address]. After hours, contact General Neurology

## 2016-09-24 NOTE — Progress Notes (Signed)
Rehab Admissions Coordinator Note:  Patient was screened by Clois DupesBoyette, Andrea Colglazier Godwin for appropriateness for an Inpatient Acute Rehab Consult.  At this time, we are recommending Inpatient Rehab consult.  Clois DupesBoyette, Bijon Mineer Godwin 09/24/2016, 8:13 PM  I can be reached at [phone].
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
## 2016-09-25 ENCOUNTER — Other Ambulatory Visit (HOSPITAL_COMMUNITY): Payer: Self-pay

## 2016-09-25 DIAGNOSIS — S06349A Traumatic hemorrhage of right cerebrum with loss of consciousness of unspecified duration, initial encounter: Secondary | ICD-10-CM

## 2016-09-25 DIAGNOSIS — I619 Nontraumatic intracerebral hemorrhage, unspecified: Secondary | ICD-10-CM

## 2016-09-25 DIAGNOSIS — E7800 Pure hypercholesterolemia, unspecified: Secondary | ICD-10-CM

## 2016-09-25 DIAGNOSIS — F1010 Alcohol abuse, uncomplicated: Secondary | ICD-10-CM

## 2016-09-25 DIAGNOSIS — F339 Major depressive disorder, recurrent, unspecified: Secondary | ICD-10-CM | POA: Diagnosis present

## 2016-09-25 DIAGNOSIS — F1410 Cocaine abuse, uncomplicated: Secondary | ICD-10-CM | POA: Diagnosis present

## 2016-09-25 DIAGNOSIS — Z720 Tobacco use: Secondary | ICD-10-CM | POA: Diagnosis present

## 2016-09-25 LAB — CBC
HCT: 39.3 % (ref 36.0–46.0)
HEMOGLOBIN: 13.2 g/dL (ref 12.0–15.0)
MCH: 32.9 pg (ref 26.0–34.0)
MCHC: 33.6 g/dL (ref 30.0–36.0)
MCV: 98 fL (ref 78.0–100.0)
PLATELETS: 268 10*3/uL (ref 150–400)
RBC: 4.01 MIL/uL (ref 3.87–5.11)
RDW: 14.3 % (ref 11.5–15.5)
WBC: 8 10*3/uL (ref 4.0–10.5)

## 2016-09-25 LAB — BASIC METABOLIC PANEL
Anion gap: 8 (ref 5–15)
BUN: 17 mg/dL (ref 6–20)
CHLORIDE: 106 mmol/L (ref 101–111)
CO2: 23 mmol/L (ref 22–32)
Calcium: 9.2 mg/dL (ref 8.9–10.3)
Creatinine, Ser: 0.89 mg/dL (ref 0.44–1.00)
Glucose, Bld: 103 mg/dL — ABNORMAL HIGH (ref 65–99)
POTASSIUM: 3.8 mmol/L (ref 3.5–5.1)
SODIUM: 137 mmol/L (ref 135–145)

## 2016-09-25 LAB — HEMOGLOBIN A1C
HEMOGLOBIN A1C: 5.2 % (ref 4.8–5.6)
MEAN PLASMA GLUCOSE: 103 mg/dL

## 2016-09-25 MED ORDER — HYDRALAZINE HCL 20 MG/ML IJ SOLN: 10.0000 mg | INTRAMUSCULAR | Status: DC | PRN

## 2016-09-25 MED ORDER — ATORVASTATIN CALCIUM 10 MG PO TABS
10.0000 mg | ORAL_TABLET | Freq: Every day | ORAL | Status: DC
  Administered 2016-09-25 – 2016-09-26 (×2): 10 mg via ORAL
  Filled 2016-09-25 (×2): qty 1

## 2016-09-25 MED ORDER — TRAZODONE HCL 50 MG PO TABS
50.0000 mg | ORAL_TABLET | Freq: Every evening | ORAL | Status: DC | PRN
  Administered 2016-09-25: 50 mg via ORAL
  Filled 2016-09-25: qty 1

## 2016-09-25 MED ORDER — LABETALOL HCL 5 MG/ML IV SOLN: 10.0000 mg | INTRAVENOUS | Status: DC | PRN
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
## 2016-09-25 NOTE — Progress Notes (Signed)
Physical Therapy Treatment Patient Details Name: Kelsey L Sult MRN: [id number] DOB: 06/27/1961 Today's Date: 09/25/2016    History of Present Illness Kelsey Smith is an 56 y.o. female who presents with acute left thalamic ICH.     PT Comments    Patient progressing slowly towards PT goals. Tolerated gait training with Mod A of 2 for balance, weight shift to the left and to advance RLE. Pt demonstrates some expressive aphasia. Able to initiate using RUE for functional tasks but fatigues. Motivated to return to PLOF. Great rehab candidate. Will follow acutely.   Follow Up Recommendations  CIR;Supervision/Assistance - 24 hour     Equipment Recommendations  Other (comment) (TBA)    Recommendations for Other Services       Precautions / Restrictions Precautions Precautions: Fall Restrictions Weight Bearing Restrictions: No    Mobility  Bed Mobility Overal bed mobility: Needs Assistance Bed Mobility: Supine to Sit     Supine to sit: Mod assist;HOB elevated     General bed mobility comments: When given verbal cues to bring LEs to EOB, pt pushing against bed and pushing self up in bed; required initiation of LEs off bed and assist.  Transfers Overall transfer level: Needs assistance Equipment used: 1 person hand held assist Transfers: Sit to/from Stand Sit to Stand: Min assist         General transfer comment: Manual cues for hand placement and assist to boost to standing. Right knee instability noted. Stood from [employer] x1, from chair x1.   Ambulation/Gait Ambulation/Gait assistance: Mod assist;+2 physical assistance;+2 safety/equipment Ambulation Distance (Feet): 12 Feet Assistive device: 2 person hand held assist Gait Pattern/deviations: Step-to pattern;Decreased step length - right;Decreased stance time - left;Decreased stride length;Decreased dorsiflexion - right;Decreased weight shift to left;Trunk flexed;Narrow base of support   Gait velocity interpretation:  Below normal speed for age/gender General Gait Details: Assist to initiate and advance RLE; assist for weight shifting towards left side to progress RLE. With increased distance, pt fatigued and had increased knee instability.    Stairs            Wheelchair Mobility    Modified Rankin (Stroke Patients Only) Modified Rankin (Stroke Patients Only) Pre-Morbid Rankin Score: No significant disability Modified Rankin: Moderately severe disability     Balance Overall balance assessment: Needs assistance Sitting-balance support: Feet supported;Single extremity supported Sitting balance-Leahy Scale: Fair Sitting balance - Comments: Needed at least 1 extremity supported. Able to use RUE to comb hair sitting in chair but fatigued quickly.   Standing balance support: During functional activity;Single extremity supported Standing balance-Leahy Scale: Poor Standing balance comment: Needed external support for standing balance with right knee instability.                     Cognition Arousal/Alertness: Awake/alert Behavior During Therapy: Flat affect Overall Cognitive Status: Impaired/Different from baseline Area of Impairment: Following commands;Problem solving       Following Commands: Follows multi-step commands inconsistently     Problem Solving: Difficulty sequencing;Decreased initiation;Slow processing General Comments: Difficult to assess due to aphasia. When asked why she was in the hospital, pt reports, "i got all messed up." Difficulty following commands to get to EOB despite tactile cues and repetition.    Exercises      General Comments General comments (skin integrity, edema, etc.): Roommate and friend asked to step out of room for session per pt request.       Pertinent Vitals/Pain Pain Assessment: No/denies pain  Home Living                      Prior Function            PT Goals (current goals can now be found in the care plan  section) Progress towards PT goals: Progressing toward goals    Frequency    Min 4X/week      PT Plan Current plan remains appropriate    Co-evaluation             End of Session Equipment Utilized During Treatment: Gait belt Activity Tolerance: Patient limited by fatigue Patient left: in chair;with call bell/phone within reach;with chair alarm set     Time: 1030-1048 PT Time Calculation (min) (ACUTE ONLY): 18 min  Charges:  $Gait Training: 8-22 mins                    G Codes:      Kelsey Smith 09/25/2016, 10:55 AM  Mylo Red, PT, DPT [phone]

## 2016-09-25 NOTE — Progress Notes (Signed)
Inpatient Rehabilitation  IP consult has been completed.  I will follow up with pt. tomorrow to  discuss recommendations from consult.  Please call if questions.  Weldon PickingSusan Rawn Quiroa PT Inpatient Rehab Admissions Coordinator Cell [phone] Office [phone]

## 2016-09-25 NOTE — Progress Notes (Addendum)
STROKE TEAM PROGRESS NOTE   SUBJECTIVE (INTERVAL HISTORY) No family at bedside. Pt just finished breakfast. She has no complains. No HA. Speech improving. pending CIR. Off cardene and on po BP meds. BP stable.     OBJECTIVE Temp:  [97.5 F (36.4 C)-98.9 F (37.2 C)] 98.7 F (37.1 C) (01/02 0800) Pulse Rate:  [63-84] 64 (01/02 0800) Cardiac Rhythm: Normal sinus rhythm (01/02 0800) Resp:  [11-19] 13 (01/02 0800) BP: (99-166)/(69-104) 132/88 (01/02 0800) SpO2:  [97 %-100 %] 97 % (01/02 0800)  CBC:  Recent Labs Lab 09/23/16 1405  09/24/16 0716 09/25/16 0455  WBC 8.5  --  7.6 8.0  NEUTROABS 5.6  --   --   --   HGB 14.8  < > 14.5 13.2  HCT 43.5  < > 42.8 39.3  MCV 96.7  --  97.7 98.0  PLT 294  --  294 268  < > = values in this interval not displayed.  Basic Metabolic Panel:   Recent Labs Lab 09/24/16 0716 09/25/16 0455  NA 138 137  K 3.5 3.8  CL 104 106  CO2 22 23  GLUCOSE 90 103*  BUN 15 17  CREATININE 0.99 0.89  CALCIUM 9.7 9.2    Lipid Panel:     Component Value Date/Time   CHOL 216 (H) 09/24/2016 0716   TRIG 139 09/24/2016 0716   HDL 79 09/24/2016 0716   CHOLHDL 2.7 09/24/2016 0716   VLDL 28 09/24/2016 0716   LDLCALC 109 (H) 09/24/2016 0716   HgbA1c:  Lab Results  Component Value Date   HGBA1C 5.2 09/24/2016   Urine Drug Screen:     Component Value Date/Time   LABOPIA NONE DETECTED 09/23/2016 1513   COCAINSCRNUR POSITIVE (A) 09/23/2016 1513   LABBENZ NONE DETECTED 09/23/2016 1513   AMPHETMU NONE DETECTED 09/23/2016 1513   THCU NONE DETECTED 09/23/2016 1513   LABBARB NONE DETECTED 09/23/2016 1513      IMAGING I have personally reviewed the radiological images below and agree with the radiology interpretations.  Ct Head Wo Contrast 09/23/2016 Acute intraparenchymal hemorrhage in the left thalamus with a calculated volume of 15 cc. Intraventricular penetration. Mass effect with 3 mm of left-to-right shift.   Mri and Mra Head Wo  Contrast 09/24/2016 IMPRESSION: 1. 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 Recommend Neuro endovascular consultation. 2. Stable left thalamic hemorrhage with estimated blood volume of 15 mm. Surrounding edema and mild regional mass effect. 3. Intraventricular extension of hemorrhage without ventriculomegaly. 4. Underlying mildly to moderately age advanced nonspecific white matter signal changes, favor small vessel disease related. 5. No intracranial stenosis or circle of Willis branch occlusion.  TTE pending   PHYSICAL EXAM  Temp:  [97.5 F (36.4 C)-98.9 F (37.2 C)] 98.7 F (37.1 C) (01/02 0800) Pulse Rate:  [63-84] 64 (01/02 0800) Resp:  [11-19] 13 (01/02 0800) BP: (99-166)/(69-104) 132/88 (01/02 0800) SpO2:  [97 %-100 %] 97 % (01/02 0800)  General - Well nourished, well developed, no acute distress  Ophthalmologic - Fundi not visualized due to noncooperation.  Cardiovascular - Regular rate and rhythm.  Mental Status -  Level of arousal and orientation to place, and person were intact, but not orientated to time. Language including naming, repetition, comprehension was assessed and found intact, however, subtle expressive aphasia for long sentences.  Cranial Nerves II - XII - II - Visual field intact OU.  III, IV, VI - Extraocular movements intact. V - Facial sensation intact bilaterally. VII - right mild nasolabial fold flattening. VIII - Hearing & vestibular intact bilaterally. X - Palate elevates symmetrically, mild dysarthria. XI - Chin turning & shoulder shrug intact bilaterally. XII - Tongue protrusion intact.  Motor Strength - The patient's strength was normal LUE and LLE, but 4/5 RUE and 4+/5 RLE and pronator drift was present.  Bulk was normal and fasciculations were absent.   Motor Tone - Muscle tone was assessed at the neck and appendages and was  normal.  Reflexes - The patient's reflexes were 1+ in all extremities and she had no pathological reflexes.  Sensory - Light touch, temperature/pinprick were assessed and were symmetrical.    Coordination - The patient had normal movements in the left hand with no ataxia or dysmetria.  Tremor was absent.  Gait and Station - deferred.   ASSESSMENT/PLAN [name] is a 56 y.o. female with history of depression presenting with speech difficulty, HA and weakness. She did not receive IV t-PA due to ICH.   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 No AVM at left thalamus  2D Echo  pending   LDL 109  HgbA1c 5.2  VitB12 140  UDS positive for cocaine  SCDs for VTE prophylaxis Diet Heart Room service appropriate? Yes; Fluid consistency: Thin  No antithrombotic prior to admission.   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  outpt follow up   Cocaine abuse  UDS positive for cocaine  Cessation counseling provided  Pt is willing to quit  Avoid beta blocker  Change labetalol prn to hydralazine prn  B12 deficiency  B12 140  B12 supplement with IM and PO  Alcohol abuse  Heavy alcohol use at home  CIWA protocol  B1, FA and multivitamine  Hypertensive Emergency  BP as high as 169/118 on arrival  off Cleviprex  On lisinopril  BP stable now, goal < 160 Long-term BP goal normotensive  Tobacco abuse  Current smoker  Smoking cessation counseling provided  Pt is willing to quit  Hyperlipidemia  Not on statin at home  Will add lipitor 10mg    Continue statin on discharge  Other Stroke Risk  Factors    Other Active Problems  Depression on paxil, buspar and paxil  Hospital day # 1  This patient is critically ill due to left ICH with IVH, hypertension, and alcohol abuse and at significant risk of neurological worsening, death form recurrent ICH, hematoma extension, hypertensive emergency, DT. This patient's care requires constant monitoring of vital signs, hemodynamics, respiratory and cardiac monitoring, review of multiple databases, neurological assessment, discussion with family, other specialists and medical decision making of high complexity. I spent 35 minutes of neurocritical care time in the care of this patient.  Marvel Plan, MD PhD Stroke Neurology 09/25/2016 9:35 AM   To contact Stroke Continuity provider, please refer to [email or web address]. After hours, contact General Neurology

## 2016-09-25 NOTE — Progress Notes (Signed)
Patient arrived to unit by wheelchair.  Alert, verbally pleasant.  Able to transfer one person assist to bedside chair, then to bed at request.  Patient denies pain or discomfort.  Skin warm and dry to touch.  No tremors noted. Patient is calm with noted expressive aphasia at times.  Telemetry applied and verified.

## 2016-09-26 ENCOUNTER — Inpatient Hospital Stay (HOSPITAL_COMMUNITY)
Admission: RE | Admit: 2016-09-26 | Discharge: 2016-10-17 | DRG: 057 | Disposition: A | Discharge status: Home Health | Payer: Medicaid Other | Source: Intra-hospital | Attending: Physical Medicine & Rehabilitation | Admitting: Physical Medicine & Rehabilitation

## 2016-09-26 ENCOUNTER — Encounter (HOSPITAL_COMMUNITY): Payer: Self-pay | Admitting: Physical Medicine and Rehabilitation

## 2016-09-26 ENCOUNTER — Inpatient Hospital Stay (HOSPITAL_COMMUNITY): Payer: Medicaid Other

## 2016-09-26 DIAGNOSIS — I69251 Hemiplegia and hemiparesis following other nontraumatic intracranial hemorrhage affecting right dominant side: Principal | ICD-10-CM

## 2016-09-26 DIAGNOSIS — K5901 Slow transit constipation: Secondary | ICD-10-CM

## 2016-09-26 DIAGNOSIS — R400 Somnolence: Secondary | ICD-10-CM | POA: Diagnosis present

## 2016-09-26 DIAGNOSIS — I615 Nontraumatic intracerebral hemorrhage, intraventricular: Secondary | ICD-10-CM

## 2016-09-26 DIAGNOSIS — E538 Deficiency of other specified B group vitamins: Secondary | ICD-10-CM | POA: Diagnosis present

## 2016-09-26 DIAGNOSIS — F329 Major depressive disorder, single episode, unspecified: Secondary | ICD-10-CM | POA: Diagnosis present

## 2016-09-26 DIAGNOSIS — R531 Weakness: Secondary | ICD-10-CM | POA: Diagnosis present

## 2016-09-26 DIAGNOSIS — I6789 Other cerebrovascular disease: Secondary | ICD-10-CM

## 2016-09-26 DIAGNOSIS — S81801D Unspecified open wound, right lower leg, subsequent encounter: Secondary | ICD-10-CM

## 2016-09-26 DIAGNOSIS — R262 Difficulty in walking, not elsewhere classified: Secondary | ICD-10-CM | POA: Diagnosis present

## 2016-09-26 DIAGNOSIS — R5383 Other fatigue: Secondary | ICD-10-CM

## 2016-09-26 DIAGNOSIS — F17210 Nicotine dependence, cigarettes, uncomplicated: Secondary | ICD-10-CM | POA: Diagnosis present

## 2016-09-26 DIAGNOSIS — W19XXXD Unspecified fall, subsequent encounter: Secondary | ICD-10-CM | POA: Diagnosis present

## 2016-09-26 DIAGNOSIS — I161 Hypertensive emergency: Secondary | ICD-10-CM

## 2016-09-26 DIAGNOSIS — F419 Anxiety disorder, unspecified: Secondary | ICD-10-CM | POA: Diagnosis present

## 2016-09-26 DIAGNOSIS — I619 Nontraumatic intracerebral hemorrhage, unspecified: Secondary | ICD-10-CM | POA: Diagnosis present

## 2016-09-26 DIAGNOSIS — I720 Aneurysm of carotid artery: Secondary | ICD-10-CM

## 2016-09-26 DIAGNOSIS — I10 Essential (primary) hypertension: Secondary | ICD-10-CM | POA: Diagnosis present

## 2016-09-26 DIAGNOSIS — R4701 Aphasia: Secondary | ICD-10-CM | POA: Diagnosis present

## 2016-09-26 DIAGNOSIS — S71101D Unspecified open wound, right thigh, subsequent encounter: Secondary | ICD-10-CM

## 2016-09-26 DIAGNOSIS — F4323 Adjustment disorder with mixed anxiety and depressed mood: Secondary | ICD-10-CM | POA: Diagnosis present

## 2016-09-26 DIAGNOSIS — G8191 Hemiplegia, unspecified affecting right dominant side: Secondary | ICD-10-CM

## 2016-09-26 DIAGNOSIS — R4189 Other symptoms and signs involving cognitive functions and awareness: Secondary | ICD-10-CM | POA: Diagnosis present

## 2016-09-26 DIAGNOSIS — S71109A Unspecified open wound, unspecified thigh, initial encounter: Secondary | ICD-10-CM | POA: Diagnosis present

## 2016-09-26 DIAGNOSIS — Z79899 Other long term (current) drug therapy: Secondary | ICD-10-CM

## 2016-09-26 DIAGNOSIS — R110 Nausea: Secondary | ICD-10-CM

## 2016-09-26 LAB — BASIC METABOLIC PANEL
ANION GAP: 8 (ref 5–15)
BUN: 10 mg/dL (ref 6–20)
CALCIUM: 9.3 mg/dL (ref 8.9–10.3)
CO2: 25 mmol/L (ref 22–32)
CREATININE: 0.72 mg/dL (ref 0.44–1.00)
Chloride: 107 mmol/L (ref 101–111)
Glucose, Bld: 113 mg/dL — ABNORMAL HIGH (ref 65–99)
Potassium: 3.7 mmol/L (ref 3.5–5.1)
SODIUM: 140 mmol/L (ref 135–145)

## 2016-09-26 LAB — CBC
HCT: 37.8 % (ref 36.0–46.0)
Hemoglobin: 12.6 g/dL (ref 12.0–15.0)
MCH: 32.7 pg (ref 26.0–34.0)
MCHC: 33.3 g/dL (ref 30.0–36.0)
MCV: 98.2 fL (ref 78.0–100.0)
PLATELETS: 292 10*3/uL (ref 150–400)
RBC: 3.85 MIL/uL — ABNORMAL LOW (ref 3.87–5.11)
RDW: 14.2 % (ref 11.5–15.5)
WBC: 9.1 10*3/uL (ref 4.0–10.5)

## 2016-09-26 LAB — ECHOCARDIOGRAM COMPLETE
Height: 61 in
Weight: 1661.39 oz

## 2016-09-26 MED ORDER — ATORVASTATIN CALCIUM 10 MG PO TABS
10.0000 mg | ORAL_TABLET | Freq: Every day | ORAL | Status: DC
  Administered 2016-09-27 – 2016-09-28 (×2): 10 mg via ORAL
  Filled 2016-09-26 (×2): qty 1

## 2016-09-26 MED ORDER — PROCHLORPERAZINE EDISYLATE 5 MG/ML IJ SOLN: 5.0000 mg | Freq: Four times a day (QID) | INTRAMUSCULAR | Status: DC | PRN

## 2016-09-26 MED ORDER — ENOXAPARIN SODIUM 40 MG/0.4ML ~~LOC~~ SOLN
40.0000 mg | SUBCUTANEOUS | Status: DC
  Administered 2016-09-26 – 2016-10-16 (×21): 40 mg via SUBCUTANEOUS
  Filled 2016-09-26 (×21): qty 0.4

## 2016-09-26 MED ORDER — VITAMIN B-12 1000 MCG PO TABS
1000.0000 ug | ORAL_TABLET | Freq: Every day | ORAL | Status: DC
  Administered 2016-09-27 – 2016-10-17 (×21): 1000 ug via ORAL
  Filled 2016-09-26 (×21): qty 1

## 2016-09-26 MED ORDER — POLYETHYLENE GLYCOL 3350 17 G PO PACK
17.0000 g | PACK | Freq: Every day | ORAL | Status: DC | PRN
  Administered 2016-09-28: 17 g via ORAL
  Filled 2016-09-26 (×2): qty 1

## 2016-09-26 MED ORDER — PAROXETINE HCL 10 MG PO TABS
20.0000 mg | ORAL_TABLET | Freq: Every morning | ORAL | Status: DC
  Administered 2016-09-27 – 2016-10-17 (×21): 20 mg via ORAL
  Filled 2016-09-26 (×21): qty 2

## 2016-09-26 MED ORDER — PANTOPRAZOLE SODIUM 40 MG PO TBEC
40.0000 mg | DELAYED_RELEASE_TABLET | Freq: Every day | ORAL | Status: DC
  Administered 2016-09-27 – 2016-10-17 (×21): 40 mg via ORAL
  Filled 2016-09-26 (×21): qty 1

## 2016-09-26 MED ORDER — TRAZODONE HCL 50 MG PO TABS: 50.0000 mg | ORAL_TABLET | Freq: Every evening | ORAL | Status: DC | PRN

## 2016-09-26 MED ORDER — TRAZODONE HCL 50 MG PO TABS
25.0000 mg | ORAL_TABLET | Freq: Every evening | ORAL | Status: DC | PRN
  Administered 2016-09-26 – 2016-10-15 (×20): 50 mg via ORAL
  Filled 2016-09-26 (×21): qty 1

## 2016-09-26 MED ORDER — PROCHLORPERAZINE 25 MG RE SUPP: 12.5000 mg | Freq: Four times a day (QID) | RECTAL | Status: DC | PRN

## 2016-09-26 MED ORDER — FOLIC ACID 1 MG PO TABS
1.0000 mg | ORAL_TABLET | Freq: Every day | ORAL | Status: DC
  Administered 2016-09-27 – 2016-10-17 (×20): 1 mg via ORAL
  Filled 2016-09-26 (×19): qty 1

## 2016-09-26 MED ORDER — PROCHLORPERAZINE MALEATE 5 MG PO TABS
5.0000 mg | ORAL_TABLET | Freq: Four times a day (QID) | ORAL | Status: DC | PRN
  Administered 2016-09-26: 5 mg via ORAL
  Administered 2016-09-27 – 2016-09-29 (×2): 10 mg via ORAL
  Administered 2016-09-29: 5 mg via ORAL
  Administered 2016-09-30: 10 mg via ORAL
  Administered 2016-10-01: 5 mg via ORAL
  Filled 2016-09-26 (×4): qty 2
  Filled 2016-09-26 (×2): qty 1

## 2016-09-26 MED ORDER — GUAIFENESIN-DM 100-10 MG/5ML PO SYRP: 5.0000 mL | ORAL_SOLUTION | Freq: Four times a day (QID) | ORAL | Status: DC | PRN

## 2016-09-26 MED ORDER — ACETAMINOPHEN 325 MG PO TABS
325.0000 mg | ORAL_TABLET | ORAL | Status: DC | PRN
  Administered 2016-09-26 – 2016-10-08 (×19): 650 mg via ORAL
  Filled 2016-09-26 (×21): qty 2

## 2016-09-26 MED ORDER — BUSPIRONE HCL 10 MG PO TABS
10.0000 mg | ORAL_TABLET | Freq: Every morning | ORAL | Status: DC
  Administered 2016-09-27 – 2016-10-17 (×21): 10 mg via ORAL
  Filled 2016-09-26 (×22): qty 1

## 2016-09-26 MED ORDER — ENSURE ENLIVE PO LIQD
237.0000 mL | Freq: Two times a day (BID) | ORAL | Status: DC
  Administered 2016-09-27 – 2016-09-28 (×2): 237 mL via ORAL

## 2016-09-26 MED ORDER — SENNOSIDES-DOCUSATE SODIUM 8.6-50 MG PO TABS
1.0000 | ORAL_TABLET | Freq: Two times a day (BID) | ORAL | Status: DC
  Administered 2016-09-26 – 2016-10-04 (×16): 1 via ORAL
  Filled 2016-09-26 (×16): qty 1

## 2016-09-26 MED ORDER — ADULT MULTIVITAMIN W/MINERALS CH
1.0000 | ORAL_TABLET | Freq: Every day | ORAL | Status: DC
  Administered 2016-09-27 – 2016-10-17 (×20): 1 via ORAL
  Filled 2016-09-26 (×20): qty 1

## 2016-09-26 MED ORDER — FLEET ENEMA 7-19 GM/118ML RE ENEM: 1.0000 | ENEMA | Freq: Once | RECTAL | Status: DC | PRN

## 2016-09-26 MED ORDER — LISINOPRIL 20 MG PO TABS
20.0000 mg | ORAL_TABLET | Freq: Every day | ORAL | Status: DC
  Administered 2016-09-27 – 2016-10-17 (×19): 20 mg via ORAL
  Filled 2016-09-26 (×21): qty 1

## 2016-09-26 MED ORDER — BISACODYL 10 MG RE SUPP
10.0000 mg | Freq: Every day | RECTAL | Status: DC | PRN
  Administered 2016-09-30: 10 mg via RECTAL
  Filled 2016-09-26: qty 1

## 2016-09-26 MED ORDER — ALUM & MAG HYDROXIDE-SIMETH 200-200-20 MG/5ML PO SUSP: 30.0000 mL | ORAL | Status: DC | PRN

## 2016-09-26 MED ORDER — DIPHENHYDRAMINE HCL 12.5 MG/5ML PO ELIX: 12.5000 mg | ORAL_SOLUTION | Freq: Four times a day (QID) | ORAL | Status: DC | PRN

## 2016-09-26 MED ORDER — NITROGLYCERIN 2 % TD OINT
0.5000 [in_us] | TOPICAL_OINTMENT | TRANSDERMAL | Status: DC | PRN
  Administered 2016-09-27: 0.5 [in_us] via TOPICAL
  Filled 2016-09-26: qty 30

## 2016-09-26 MED ORDER — VITAMIN B-1 100 MG PO TABS
100.0000 mg | ORAL_TABLET | Freq: Every day | ORAL | Status: DC
  Administered 2016-09-27 – 2016-10-17 (×21): 100 mg via ORAL
  Filled 2016-09-26 (×21): qty 1
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
## 2016-09-26 NOTE — Progress Notes (Signed)
OT Cancellation Note  Patient Details Name: Kelsey Smith MRN: [id number] DOB: 1960-11-22   Cancelled Treatment:    Reason Eval/Treat Not Completed: Medical issues which prohibited therapy (Pt with nausea. Plan is to d/c to CIR later today.)  Evern BioMayberry, Samayra Hebel Lynn 09/26/2016, 2:09 PM
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
## 2016-09-26 NOTE — Progress Notes (Signed)
Inpatient Rehabilitation  I met with the patient and spoke with 2 of her roommates, Mark and Scott overt the phone to discuss the recommendation for IP Rehab.  I provided information about CIR and answered their questions.  Pt and her roommates are in agreement with CIR and have committed to assisting pt. following an IP Rehab stay.  I have spoken with Dr. Xu and he clears pt. for admission later today pending results of her 2 D echo.  I will make arrangements for pt. to admit later today.  I have updated pt's RN Laquitta and Kelli Willard, RNCM of the plan.  Please call if questions.   

## 2016-09-26 NOTE — Evaluation (Signed)
Speech Language Pathology Evaluation Patient Details Name: [name] L Chirco MRN: [id number] DOB: Nov 05, 1960 Today's Date: 09/26/2016 Time: [id number] SLP Time Calculation (min) (ACUTE ONLY): 10 min  Problem List:  Patient Active Problem List   Diagnosis Date Noted  . ICH (intracerebral hemorrhage) (HCC)   . Recurrent major depressive disorder (HCC)   . ETOH abuse   . Tobacco abuse   . Cocaine abuse   . Pure hypercholesterolemia   . Intraparenchymal hemorrhage of brain (HCC) 09/23/2016   Past Medical History:  Past Medical History:  Diagnosis Date  . Depression    Past Surgical History: History reviewed. No pertinent surgical history. HPI:  [name] L Leinweber is an 56 y.o. female who presents with acute left thalamic ICH.    Assessment / Plan / Recommendation Clinical Impression  Cognitive-linguistic evaluation initiated.  Patient's ability to sustain attention and complete portions of the [facility] were significantly reduced due to reports of pain, headache 8/10.  Patient not fully oriented to date and day of the week but overall oriented to location and recent events.  True cognitive deficits versus impairments due to distraction from pain unable to be differentiated at this visit.  Suspect that basic skills are intact.  However, given that patient was completely independent driving, working as a Social workerhair stylist, and managing her own finances and medication prior to admission ongoing assessment of high level abilities is warranted.  Recommend further assessment upon IP Rehab admission.      SLP Assessment  All further Speech Lanaguage Pathology  needs can be addressed in the next venue of care    Follow Up Recommendations  Inpatient Rehab    Frequency and Duration To be determined at next level of care         SLP Evaluation Cognition  Overall Cognitive Status: Impaired/Different from baseline Arousal/Alertness: Lethargic Orientation Level: Oriented to  person;Oriented to place;Disoriented to time;Oriented to situation Attention: Sustained Sustained Attention: Impaired Sustained Attention Impairment: Verbal basic;Functional basic Memory: Impaired Memory Impairment: Storage deficit Problem Solving: Impaired Problem Solving Impairment: Verbal complex;Verbal basic Executive Function:  (TBA) Safety/Judgment: Appears intact       Comprehension  Auditory Comprehension Overall Auditory Comprehension:  (difficult to assess, but suspect basic is [facility])    Expression Expression Primary Mode of Expression: Verbal Verbal Expression Overall Verbal Expression: Appears within functional limits for tasks assessed   Oral / Motor  Oral Motor/Sensory Function Overall Oral Motor/Sensory Function:  (difficult to assess appeared functional ) Motor Speech Overall Motor Speech: Appears within functional limits for tasks assessed   GO                   Charlane FerrettiMelissa Bryar Rennie, M.A., CCC-SLP [phone]  Shawny Borkowski 09/26/2016, 11:37 AM

## 2016-09-26 NOTE — Progress Notes (Signed)
  Echocardiogram 2D Echocardiogram has been performed.  Leta JunglingCooper, Jarely Juncaj M 09/26/2016, 12:10 PM

## 2016-09-26 NOTE — Progress Notes (Signed)
Pt to transfer to 4W, report given to nurse.
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
## 2016-09-26 NOTE — Progress Notes (Signed)
Patient received at approximately 1828 arousable  very sleepy.Patient oriented to unit and call bell system. Patient verbalized understanding of rehab process. Left inner "antecubital" area of arm noted red, hard, slight bleeding noted. Skin assessed with Dorita Sciara. Tarpley, RN and dressing applied. Continue with plan of care.  Kelsey Smith, [name]

## 2016-09-26 NOTE — Care Management Note (Signed)
Case Management Note  Patient Details  Name: Kelsey Smith MRN: [id number] Date of Birth: [birth year]  Subjective/Objective:   Pt admitted with intraparenchymal hemorrhage. She is from home with friends. Pt without insurance and no PCP.                Action/Plan: PT/OT recommending CIR. CM following for d/c disposition.   Expected Discharge Date:                  Expected Discharge Plan:  IP Rehab Facility  In-House Referral:     Discharge planning Services     Post Acute Care Choice:    Choice offered to:     DME Arranged:    DME Agency:     HH Arranged:    HH Agency:     Status of Service:  In process, will continue to follow  If discussed at Long Length of Stay Meetings, dates discussed:    Additional Comments:  Kermit BaloKelli F Shafin Pollio, RN 09/26/2016, 10:49 AM
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
## 2016-09-26 NOTE — Progress Notes (Signed)
PT Cancellation Note  Patient Details Name: Kelsey Smith MRN: [id number] DOB: 02-Aug-1961   Cancelled Treatment:    Reason Eval/Treat Not Completed: Fatigue/lethargy limiting ability to participate; patient reports feeling nauseous, lunch tray in room untouched.  Also states plans to d/c to CIR this afternoon (RN confirmed).  Will cancel for today.   Elray McgregorCynthia Shanea Karney 09/26/2016, 1:52 PM  Sheran Lawlessyndi Alwilda Gilland, [place]PT [phone] 09/26/2016

## 2016-09-27 ENCOUNTER — Inpatient Hospital Stay (HOSPITAL_COMMUNITY): Payer: Self-pay | Admitting: Physical Therapy

## 2016-09-27 ENCOUNTER — Inpatient Hospital Stay (HOSPITAL_COMMUNITY): Payer: Self-pay | Admitting: Occupational Therapy

## 2016-09-27 ENCOUNTER — Inpatient Hospital Stay (HOSPITAL_COMMUNITY): Payer: Medicaid Other | Admitting: Speech Pathology

## 2016-09-27 DIAGNOSIS — I10 Essential (primary) hypertension: Secondary | ICD-10-CM

## 2016-09-27 DIAGNOSIS — E876 Hypokalemia: Secondary | ICD-10-CM

## 2016-09-27 DIAGNOSIS — I619 Nontraumatic intracerebral hemorrhage, unspecified: Secondary | ICD-10-CM

## 2016-09-27 LAB — CBC WITH DIFFERENTIAL/PLATELET
Basophils Absolute: 0 10*3/uL (ref 0.0–0.1)
Basophils Relative: 0 %
Eosinophils Absolute: 0 10*3/uL (ref 0.0–0.7)
Eosinophils Relative: 0 %
HEMATOCRIT: 38.4 % (ref 36.0–46.0)
HEMOGLOBIN: 13.1 g/dL (ref 12.0–15.0)
LYMPHS ABS: 1.6 10*3/uL (ref 0.7–4.0)
Lymphocytes Relative: 14 %
MCH: 32.8 pg (ref 26.0–34.0)
MCHC: 34.1 g/dL (ref 30.0–36.0)
MCV: 96.2 fL (ref 78.0–100.0)
MONO ABS: 1 10*3/uL (ref 0.1–1.0)
MONOS PCT: 9 %
Neutro Abs: 8.3 10*3/uL — ABNORMAL HIGH (ref 1.7–7.7)
Neutrophils Relative %: 77 %
Platelets: 316 10*3/uL (ref 150–400)
RBC: 3.99 MIL/uL (ref 3.87–5.11)
RDW: 13.5 % (ref 11.5–15.5)
WBC: 10.8 10*3/uL — ABNORMAL HIGH (ref 4.0–10.5)

## 2016-09-27 LAB — COMPREHENSIVE METABOLIC PANEL
ALK PHOS: 69 U/L (ref 38–126)
ALT: 13 U/L — ABNORMAL LOW (ref 14–54)
ANION GAP: 10 (ref 5–15)
AST: 19 U/L (ref 15–41)
Albumin: 3.6 g/dL (ref 3.5–5.0)
BILIRUBIN TOTAL: 0.6 mg/dL (ref 0.3–1.2)
BUN: 9 mg/dL (ref 6–20)
CALCIUM: 9.9 mg/dL (ref 8.9–10.3)
CO2: 24 mmol/L (ref 22–32)
Chloride: 101 mmol/L (ref 101–111)
Creatinine, Ser: 0.5 mg/dL (ref 0.44–1.00)
GFR calc non Af Amer: >60 mL/min (ref >60)
Glucose, Bld: 120 mg/dL — ABNORMAL HIGH (ref 65–99)
Potassium: 3.3 mmol/L — ABNORMAL LOW (ref 3.5–5.1)
SODIUM: 135 mmol/L (ref 135–145)
TOTAL PROTEIN: 7.6 g/dL (ref 6.5–8.1)

## 2016-09-27 MED ORDER — POTASSIUM CHLORIDE CRYS ER 20 MEQ PO TBCR
20.0000 meq | EXTENDED_RELEASE_TABLET | Freq: Every day | ORAL | Status: DC
  Administered 2016-09-27 – 2016-10-17 (×21): 20 meq via ORAL
  Filled 2016-09-27 (×21): qty 1

## 2016-09-27 NOTE — Evaluation (Addendum)
Occupational Therapy Assessment and Plan  Patient Details  Name: Kelsey Smith MRN: [id number] Date of Birth: Jan 29, 1961  OT Diagnosis: acute pain, ataxia, cognitive deficits and hemiplegia affecting dominant side Rehab Potential: Rehab Potential (ACUTE ONLY): Good ELOS: ~2 1/2 - 3weeks   Today's Date: 09/27/2016 OT Individual Time: 9371-6967 OT Individual Time Calculation (min): 40 min Missed 20 min due to increased fatigue and illness     Problem List: Patient Active Problem List   Diagnosis Date Noted  . IVH (intraventricular hemorrhage) ([place]) 09/26/2016  . B12 deficiency 09/26/2016  . Hypertensive emergency 09/26/2016  . Essential hypertension 09/26/2016  . Carotid aneurysm, left ([place]) 09/26/2016  . Open thigh wound 09/26/2016  . Somnolence   . Recurrent major depressive disorder ([place])   . ETOH abuse   . Tobacco abuse   . Cocaine abuse   . Pure hypercholesterolemia   . Intraparenchymal hemorrhage of brain ([place]) - L thalamic 09/23/2016    Past Medical History:  Past Medical History:  Diagnosis Date  . Anxiety   . Depression    Past Surgical History: No past surgical history on file.  Assessment & Plan Clinical Impression: Patient is a 56 y.o. year old female admitted on 09/23/16 with complaints of  HA, right sided weakness and difficulty speaking.  Patient had been drinking heavily the night before, sustained a fall and was helped back in bed but found on the floor next morning not acting normally. UDS positive for cocaine. 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 She was started on CIWA protocol and bleed felt to be due to HTN in setting of heavy alcohol use. MRI/MRA brain done revealing left thalamic hemorrhage with regional mass effect/edema, mild to moderate age advanced white matter disease, MRA negative for left thalamic hemorrhage and 5 mm L-ICA aneurysm.  2 D echo done today--results pending.  WOC  consulted for input on old full thickness right thigh injury and recommended Aquacel to absorb drainage.  Patient with resultant HA, aphasia, poor attention, difficulty walking due to right sided weaknessPatient transferred to CIR on 09/26/2016 .    Patient currently requires max with basic self-care skills and functional mobility secondary to muscle weakness and significant fatigue, decreased cardiorespiratoy endurance, impaired timing and sequencing, abnormal tone, unbalanced muscle activation, decreased coordination and decreased motor planning, ? visual acuity, decreased motor planning, decreased attention, decreased awareness, decreased problem solving, decreased safety awareness, decreased memory and delayed processing and decreased sitting balance, decreased standing balance, hemiplegia, decreased balance strategies and difficulty maintaining precautions.  Prior to hospitalization, patient could complete ADl with independent .  Patient will benefit from skilled intervention to decrease level of assist with basic self-care skills and increase independence with basic self-care skills prior to discharge home with care partner.  Anticipate patient will require 24 hour supervision and follow up outpatient.  OT - End of Session Activity Tolerance: Tolerates 10 - 20 min activity with multiple rests Endurance Deficit: Yes Endurance Deficit Description: significant fatigue with all functional tasks OT Assessment Rehab Potential (ACUTE ONLY): Good Barriers to Discharge: Decreased caregiver support OT Patient demonstrates impairments in the following area(s): Balance;Safety;Behavior;Sensory;Cognition;Skin Integrity;Vision;Edema;Endurance;Motor;Nutrition;Pain;Perception OT Basic ADL's Functional Problem(s): Grooming;Bathing;Dressing;Eating;Toileting OT Advanced ADL's Functional Problem(s): Simple Meal Preparation OT Transfers Functional Problem(s): Toilet;Tub/Shower OT Additional Impairment(s): Fuctional  Use of Upper Extremity OT Plan OT Intensity: Minimum of 1-2 x/day, 45 to 90 minutes OT Frequency: 5 out of 7 days OT Duration/Estimated Length of Stay: ~2  1/2 weeks OT Treatment/Interventions: Actor;Functional electrical stimulation;Pain management;Self Care/advanced ADL retraining;Therapeutic Activities;UE/LE Coordination activities;Visual/perceptual remediation/compensation;Therapeutic Exercise;Skin care/wound managment;Patient/family education;Functional mobility training;Disease mangement/prevention;Cognitive remediation/compensation;Community reintegration;DME/adaptive equipment instruction;Neuromuscular re-education;Psychosocial support;Splinting/orthotics;UE/LE Strength taining/ROM OT Self Feeding Anticipated Outcome(s): mod I  OT Basic Self-Care Anticipated Outcome(s): supervision OT Toileting Anticipated Outcome(s): supervision OT Bathroom Transfers Anticipated Outcome(s): supervision OT Recommendation Recommendations for Other Services: Neuropsych consult Patient destination: Home Follow Up Recommendations: Outpatient OT Equipment Recommended: To be determined   Skilled Therapeutic Intervention 1:1 Ot eval initiated with OT goals, purpose and role discussed.  Self care retraining at shower level. Pt very lethargic and difficulty holding her head up throughout session. Pt came to EOB with max A and while wrapping IV pt felt back onto bed twice due to fatigue. However in shower pt able to hold self up without back support. Ambulated to the bathroom with HHA with max A for trunk support and facilitation to advance right LE. Pt with difficulty achieving full right LE extension- ? Tone or tightness in joint. Pt performed tooth brushing but then requested to lay down due to extreme fatigue and "not feeling well" and feeling nauseated. Rn made aware.   OT Evaluation Precautions/Restrictions  Precautions Precautions: Fall Restrictions Weight Bearing  Restrictions: No General Chart Reviewed: Yes Family/Caregiver Present: No  Pain Pain Assessment Pain Assessment: Faces Faces Pain Scale: Hurts even more Pain Location: Head Pain Onset: On-going Pain Intervention(s): RN made aware Home Living/Prior Functioning Home Living Available Help at Discharge: Friend(s), Available 24 hours/day Type of Home: House Home Access: Stairs to enter [employer] of Steps: 10 Home Layout: One level Bathroom Shower/Tub: Other (comment) Bathroom Toilet: Standard Bathroom Accessibility: Yes ADL ADL ADL Comments: see functional navigator Vision/Perception  Vision- History Baseline Vision/History:  (wears contacts) Vision- Assessment Additional Comments: needs to continued to be assessed due to lethargy  Cognition Overall Cognitive Status: Impaired/Different from baseline Arousal/Alertness: Lethargic Orientation Level: Person;Situation;Place Person: Oriented Place: Oriented Situation: Oriented Year: 2018 Month: January Day of Week: Correct Memory: Impaired Memory Impairment: Storage deficit;Decreased recall of new information Immediate Memory Recall: Sock;Blue;Bed Memory Recall:  (unable to recall any of the words- even with cues) Sustained Attention: Impaired Sustained Attention Impairment: Verbal basic;Functional basic Awareness: Impaired Awareness Impairment: Intellectual impairment;Emergent impairment Problem Solving: Impaired Problem Solving Impairment: Verbal basic;Functional basic Executive Function:  (all impaired due to lethagy and lower levels impaired) Behaviors: Other (comment) (very lethargic) Safety/Judgment: Impaired Sensation Sensation Light Touch: Appears Intact Hot/Cold: Appears Intact Proprioception: Impaired Detail Proprioception Impaired Details: Impaired RUE;Impaired RLE Coordination Gross Motor Movements are Fluid and Coordinated: No Fine Motor Movements are Fluid and Coordinated: No Coordination  and Movement Description: slow [place] and requires concentration - limited due to cognition/ lethargy Motor  Motor Motor: Hemiplegia;Abnormal tone Motor - Skilled Clinical Observations: abnormal tone in right LE Mobility  Bed Mobility Bed Mobility: Supine to Sit;Sit to Supine Supine to Sit: 3: Mod assist Supine to Sit Details: Manual facilitation for placement;Manual facilitation for weight shifting;Verbal cues for precautions/safety Sit to Supine: 3: Mod assist Sit to Supine - Details: Verbal cues for precautions/safety;Manual facilitation for weight shifting;Verbal cues for safe use of DME/AE Transfers Transfers: Sit to Stand;Stand to Sit Sit to Stand: 3: Mod assist Stand to Sit: 4: Min assist  Trunk/Postural Assessment  Cervical Assessment Cervical Assessment: Within Functional Limits Thoracic Assessment Thoracic Assessment: Within Functional Limits Lumbar Assessment Lumbar Assessment: Within Functional Limits Postural Control Postural Control:  (limited due to fatigue but appears [facility])  Balance Balance Balance Assessed: Yes  Dynamic Sitting Balance Dynamic Sitting - Balance Support: During functional activity Dynamic Sitting - Level of Assistance: 4: Min assist Static Standing Balance Static Standing - Balance Support: During functional activity;Bilateral upper extremity supported Static Standing - Level of Assistance: 4: Min assist;3: Mod assist Dynamic Standing Balance Dynamic Standing - Balance Support: During functional activity;No upper extremity supported Dynamic Standing - Level of Assistance: 2: Max assist Extremity/Trunk Assessment RUE Assessment RUE Assessment: Exceptions to [facility] RUE AROM (degrees) RUE Overall AROM Comments: WFL RUE Strength RUE Overall Strength Comments: 3+/5  LUE Assessment LUE Assessment: Within Functional Limits   See Function Navigator for Current Functional Status.   Refer to Care Plan for Long Term Goals  Recommendations for other  services: Neuropsych   Discharge Criteria: Patient will be discharged from OT if patient refuses treatment 3 consecutive times without medical reason, if treatment goals not met, if there is a change in medical status, if patient makes no progress towards goals or if patient is discharged from hospital.  The above assessment, treatment plan, treatment alternatives and goals were discussed and mutually agreed upon: by patient  Nicoletta Ba 09/27/2016, 10:00 AM
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
## 2016-09-27 NOTE — Progress Notes (Signed)
[facility]IC[facility]ILIT[facility]     PROGRESS NOTE    Subjective/Complaints: Had a fair night. Denies pain except around right knee.  ROS: pt denies nausea, vomiting, diarrhea, cough, shortness of breath or chest pain   Objective: Vital Signs: Blood pressure (!) 157/90, pulse 65, temperature 98.9 F (37.2 C), temperature source Oral, resp. rate 16, weight 53.8 kg (118 lb 9.6 oz), SpO2 100 %. No results found.  Recent Labs  09/26/16 0631 09/27/16 0534  WBC 9.1 10.8*  HGB 12.6 13.1  HCT 37.8 38.4  PLT 292 316    Recent Labs  09/26/16 0631 09/27/16 0534  NA 140 135  K 3.7 3.3*  CL 107 101  GLUCOSE 113* 120*  BUN 10 9  CREATININE 0.72 0.50  CALCIUM 9.3 9.9   CBG (last 3)  No results for input(s): GLUCAP in the last 72 hours.  Wt Readings from Last 3 Encounters:  09/26/16 53.8 kg (118 lb 9.6 oz)  09/24/16 47.1 kg (103 lb 13.4 oz)    Physical Exam:  Constitutional: She is oriented to person, place, and time. She appears well-developed. She appears alert this morning HENT:  Head: Normocephalic and atraumatic.  Eyes: PERRL.  Neck: Normal range of motion. Neck supple.  Cardiovascular: RRR   Respiratory: CTA bilaterally without wheezes  GI: Soft. Bowel sounds are normal. She exhibits no distension. There is no tenderness.  Musculoskeletal: She exhibits no edema or tenderness.  Neurological: She is oriented to person, place, and time and easily aroused.  .  Alert and follows basic commands. Limited insight and awareness.  RUE with mild ataxia.  Poor attention.  Lacks insight into deficits.  Left central 7 Motor: R UE 4/5 proximal to distal, LUE 5/5 R LE 4/5 proximal to distal. LLE 4+ to 5/5. Sensation intact to light touch DTRs symmetric  Skin: Skin is warm and dry.  Right shin /media left knee wounds dry/intact Psychiatric: Her affect is blunt. She is slowed.   Assessment/Plan: 1. Functional and mobility deficits secondary to left thalamic  ICH which require 3+ hours per day of interdisciplinary therapy in a comprehensive inpatient rehab setting. Physiatrist is providing close team supervision and 24 hour management of active medical problems listed below. Physiatrist and rehab team continue to assess barriers to discharge/monitor patient progress toward functional and medical goals.  Function:  Bathing Bathing position   Position: Bed  Bathing parts   Body parts bathed by helper: Front perineal area, Buttocks, Right upper leg, Left upper leg  Bathing assist Assist Level: Touching or steadying assistance(Pt > 75%)      Upper Body Dressing/Undressing Upper body dressing   What is the patient wearing?: Hospital gown                Upper body assist Assist Level: Touching or steadying assistance(Pt > 75%)      Lower Body Dressing/Undressing Lower body dressing                                  Lower body assist        Toileting Toileting     Toileting steps completed by helper: Performs perineal hygiene    Toileting assist Assist level: Touching or steadying assistance (Pt.75%)   Transfers Chair/bed transfer             LobbyistLocomotion Ambulation           Wheelchair  Cognition Comprehension Comprehension assist level: Understands basic 90% of the time/cues < 10% of the time  Expression Expression assist level: Expresses complex 90% of the time/cues < 10% of the time  Social Interaction Social Interaction assist level: Interacts appropriately 90% of the time - Needs monitoring or encouragement for participation or interaction.  Problem Solving Problem solving assist level: Solves complex 90% of the time/cues < 10% of the time  Memory Memory assist level: Recognizes or recalls 90% of the time/requires cueing < 10% of the time   Medical Problem List and Plan: 1.  Weakness, cognitive deficits secondary to traumatic acute left thalamic intraparenchymal hemorrhage due to  HTN.  -begin CIR therapies 2.  DVT Prophylaxis/Anticoagulation: Pharmaceutical: Lovenox 3. Pain Management: tylenol prn.  4. Mood: LCSW to follow for evaluation and support.   -neuropsych eval while here.  -on buspar and paxil currently 5. Neuropsych: This patient is not fully capable of making decisions on her own behalf. 6. Right leg wound/Skin/Wound Care: Aquacel to absorb drainage. Wounds healing appropriately  7. Fluids/Electrolytes/Nutrition: encourage PO  -I personally reviewed the patient's labs today.   -supplement potassium 8. HTN: On lisinopril, will monitor qid--added NTG paste prn for elevated BS.  -fair control at present 9. Polysubstance abuse: has completed CIWA protocol. Counsel. 10 Somnolence: Likely due to ICH. Monitor for now.   -appears fairly alert this morning  -sleep fair to good LOS (Days) 1 A FACE TO FACE EVALUATION WAS PERFORMED  Ranelle Oyster, MD 09/27/2016 8:58 AM

## 2016-09-27 NOTE — Progress Notes (Signed)
Patient information reviewed and entered into eRehab system by Toa Mia, RN, CRRN, PPS Coordinator.  Information including medical coding and functional independence measure will be reviewed and updated through discharge.    

## 2016-09-27 NOTE — Evaluation (Signed)
Speech Language Pathology Assessment and Plan  Patient Details  Name: Kelsey Smith MRN: [id number] Date of Birth: [birth year]  SLP Diagnosis: Cognitive Impairments  Rehab Potential: Good ELOS: 2.5-3 weeks     Today's Date: 09/27/2016 SLP Individual Time: 1030-1103 SLP Individual Time Calculation (min): 33 min and Today's Date: 09/27/2016 SLP Missed Time: 27 Minutes Missed Time Reason: Patient fatigue     Problem List:  Patient Active Problem List   Diagnosis Date Noted  . IVH (intraventricular hemorrhage) ([place]) 09/26/2016  . B12 deficiency 09/26/2016  . Hypertensive emergency 09/26/2016  . Essential hypertension 09/26/2016  . Carotid aneurysm, left ([place]) 09/26/2016  . Open thigh wound 09/26/2016  . Somnolence   . Recurrent major depressive disorder ([place])   . ETOH abuse   . Tobacco abuse   . Cocaine abuse   . Pure hypercholesterolemia   . 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 Anxiety   . Depression    Past Surgical History: No past surgical history on file.  Assessment / Plan / Recommendation Clinical Impression Patient is a 56 y.o.femalewho was admitted on 09/23/16 with complaints of  HA, right sided weakness and difficulty speaking.  Patient had been drinking heavily the night before, sustained a fall and was helped back in bed but found on the floor next morning not acting normally. UDS positive for cocaine. 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 She was started on CIWA protocol and bleed felt to be due to HTN in setting of heavy alcohol use. MRI/MRA brain done revealing left thalamic hemorrhage with regional mass effect/edema, mild to moderate age advanced white matter disease, MRA negative for left thalamic hemorrhage and 5 mm L-ICA aneurysm.  2 D echo done today--results pending.  WOC consulted for input  on old full thickness right thigh injury and recommended Aquacel to absorb drainage.  Patient with resultant HA, poor attention, difficulty walking due to right sided weakness. CIR recommended and patient admitted 09/26/16.   Patient administered the MoCA (version 7.3) and scored 18/30 points with a score of 26 or above considered normal. Patient demonstrated deficits in short-term recall, awareness, attention and problem solving. However, difficult to differentiate true cognitive impairments vs. extreme lethargy. Therefore, continued skilled diagnostic treatment is needed. Patient would benefit from skilled SLP intervention to maximize her cognitive function and overall functional independence prior to discharge.    Skilled Therapeutic Interventions          Administered a cognitive-linguistic evaluation. Please see above for details. Educated patient on current cognitive impairments and goals of skilled SLP intervention. She verbalized understanding. Evaluation was limited due to fatigue and lethargy.   SLP Assessment  Patient will need skilled [facility] Pathology Services during CIR admission    Recommendations  Oral Care Recommendations: Oral care BID Recommendations for Other Services: Neuropsych consult Patient destination: Home Follow up Recommendations: Home Health SLP;24 hour supervision/assistance Equipment Recommended: None recommended by SLP    SLP Frequency 3 to 5 out of 7 days   SLP Duration  SLP Intensity  SLP Treatment/Interventions 2.5-3 weeks   Minumum of 1-2 x/day, 30 to 90 minutes  Cognitive remediation/compensation;Cueing hierarchy;Functional tasks;Patient/family education;Internal/external aids;Environmental controls;Therapeutic Activities    Pain Pain Assessment Faces Pain Scale: Hurts little more Pain Location: Abdomen Pain Intervention(s): Repositioned;Distraction  Prior Functioning Type of Home: House  Lives With: Friend(s) Available Help  at  Discharge: Friend(s);Available 24 hours/day Vocation: Full time employment  Function:   Cognition Comprehension Comprehension assist level: Understands basic 90% of the time/cues < 10% of the time  Expression   Expression assist level: Expresses basic 90% of the time/requires cueing < 10% of the time.  Social Interaction Social Interaction assist level: Interacts appropriately 50 - 74% of the time - May be physically or verbally inappropriate.  Problem Solving Problem solving assist level: Solves basic 75 - 89% of the time/requires cueing 10 - 24% of the time  Memory Memory assist level: Recognizes or recalls 50 - 74% of the time/requires cueing 25 - 49% of the time   Short Term Goals: Week 1: SLP Short Term Goal 1 (Week 1): Patient will demonstrate sustained attention to task for 5 minutes with Max A verbal cues for redirection.  SLP Short Term Goal 2 (Week 1): Patient will identify 2 cognitive and 2 physical deficits with Mod A question cues.  SLP Short Term Goal 3 (Week 1): Patient will demonstrate functional problem solving with Min A verbal cues.  SLP Short Term Goal 4 (Week 1): Patient will utilize schedule to anticipate upcoming therapy appointments, etc with Min A question cues.   Refer to Care Plan for Long Term Goals  Recommendations for other services: Neuropsych  Discharge Criteria: Patient will be discharged from SLP if patient refuses treatment 3 consecutive times without medical reason, if treatment goals not met, if there is a change in medical status, if patient makes no progress towards goals or if patient is discharged from hospital.  The above assessment, treatment plan, treatment alternatives and goals were discussed and mutually agreed upon: by patient  Kelsey Smith 09/27/2016, 4:00 PM

## 2016-09-28 ENCOUNTER — Inpatient Hospital Stay (HOSPITAL_COMMUNITY): Payer: Self-pay | Admitting: Physical Therapy

## 2016-09-28 ENCOUNTER — Inpatient Hospital Stay (HOSPITAL_COMMUNITY): Payer: Medicaid Other | Admitting: Speech Pathology

## 2016-09-28 ENCOUNTER — Inpatient Hospital Stay (HOSPITAL_COMMUNITY): Payer: Self-pay | Admitting: Occupational Therapy

## 2016-09-28 ENCOUNTER — Inpatient Hospital Stay (HOSPITAL_COMMUNITY): Payer: Self-pay

## 2016-09-28 DIAGNOSIS — E441 Mild protein-calorie malnutrition: Secondary | ICD-10-CM

## 2016-09-28 MED ORDER — ENSURE ENLIVE PO LIQD
237.0000 mL | Freq: Three times a day (TID) | ORAL | Status: DC
  Administered 2016-09-28 – 2016-10-07 (×18): 237 mL via ORAL

## 2016-09-28 NOTE — Progress Notes (Signed)
Physical Therapy Session Note  Patient Details  Name: Kelsey Smith MRN: [id number] Date of Birth: 1961-04-17  Today's Date: 09/27/2016      Short Term Goals: Week 1:  PT Short Term Goal 1 (Week 1): Pt will perform functional transfers with min A PT Short Term Goal 2 (Week 1): Pt will perform gait with max A x 20' in controlled enviornment  Skilled Therapeutic Interventions/Progress Updates:    PT attempted to see patient to treatment. Patient refused stating that she was too tired to any activity. PT suggested bed level exercises but patient declined due to fatigue from therapy earlier in day.   Pt will re-attempt at later time/date.   Therapy Documentation Precautions:  Precautions Precautions: Fall Restrictions Weight Bearing Restrictions: No General: Missed Time: 60 min ( refused)    Vital Signs: Therapy Vitals Temp: 98.6 F (37 C) Temp Source: Oral Pulse Rate: 75 Resp: 18 BP: 129/71 Patient Position (if appropriate): Lying Oxygen Therapy SpO2: 99 % O2 Device: Not Delivered  See Function Navigator for Current Functional Status.   Therapy/Group: Individual Therapy  Golden Popustin E Idalys Konecny 09/28/2016, 5:59 AM

## 2016-09-28 NOTE — Progress Notes (Signed)
Pt c/o Dizziness when laying flat. Gave Tylenol for HA pain. PT now comfortable and sleeping.

## 2016-09-28 NOTE — Progress Notes (Signed)
Initial Nutrition Assessment  DOCUMENTATION CODES:   Not applicable  INTERVENTION:  Provide Ensure Enlive po TID, each supplement provides 350 kcal and 20 grams of protein.  Encourage adequate PO intake.   NUTRITION DIAGNOSIS:   Increased nutrient needs related to  (therapy) as evidenced by estimated needs.  GOAL:   Patient will meet greater than or equal to 90% of their needs  MONITOR:   PO intake, Supplement acceptance, Labs, Weight trends, Skin, I & O's  REASON FOR ASSESSMENT:   Consult Assessment of nutrition requirement/status  ASSESSMENT:   56 y.o. female who was admitted on 09/23/16 with complaints of  HA, right sided weakness and difficulty speaking.  Patient had been drinking heavily the night before, sustained a fall and was helped back in bed but found on the floor next morning not acting normally. UDS positive for cocaine. 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 She was started on CIWA protocol and bleed felt to be due to HTN in setting of heavy alcohol use. MRI/MRA brain done revealing left thalamic hemorrhage with regional mass effect/edema, mild to moderate age advanced white matter disease, MRA negative for left thalamic hemorrhage and 5 mm L-ICA aneurysm.  Pt was asleep during time of visit and did not wake up to RD arousal. No family at bedside. RD unable to obtain nutrition history. Meal completion has been varied from 10-60% with only 25% at breakfast this AM. Pt currently has Ensure ordered. RD to modify orders to increase Ensure to TID to aid in caloric and protein needs. Unable to complete Nutrition-Focused physical exam at this time. RD to perform physical exam at next visit.   Labs and medications reviewed.   Diet Order:  Diet Heart Room service appropriate? Yes; Fluid consistency: Thin  Skin:   (Non-pressure wound on R knee)  Last BM:  1/1  Height:   Ht Readings from Last 1  Encounters:  09/23/16 5\' 1"  (1.549 m)    Weight:   Wt Readings from Last 1 Encounters:  09/27/16 108 lb 14.4 oz (49.4 kg)    Ideal Body Weight:  47.7 kg  BMI:  Body mass index is 20.58 kg/m.  Estimated Nutritional Needs:   Kcal:  1500-1800  Protein:  60-70 grams  Fluid:  >/= 1.5 L/day  EDUCATION NEEDS:   No education needs identified at this time  Roslyn SmilingStephanie Leonell Lobdell, MS, RD, LDN Pager # [phone] After hours/ weekend pager # [phone]

## 2016-09-28 NOTE — Progress Notes (Signed)
Physical Therapy Session Note  Patient Details  Name: [name] L Orton MRN: [id number] Date of Birth: 1961/06/22  Today's Date: 09/28/2016 PT Individual Time:1115-1145 (make up time)    PT Individual Time Calculation: 30 min    Short Term Goals: Week 1:  PT Short Term Goal 1 (Week 1): Pt will perform functional transfers with min A PT Short Term Goal 2 (Week 1): Pt will perform gait with max A x 20' in controlled enviornment  Skilled Therapeutic Interventions/Progress Updates:     Pt received supine in bed and agreeable to make up  PT  sessionat bed level.   Supine therex : SAQ, x 10 BLE  Heel slides x 10 BLE  Ankle pumps with manual resistance. x15 BLE  Clam shell with manual resistance. x12 BLE  Hip adduction with manual resistance. x12 BLE   BLE PNF D1 and D2 x 10 each on the RLE  Throughout treatment PT provided moderate cues for proper speed and ROM to improve strengthening aspects of movements.     Therapy Documentation Precautions:  Precautions Precautions: Fall Restrictions Weight Bearing Restrictions: No   Vital Signs: Therapy Vitals Temp: 98.6 F (37 C) Temp Source: Oral Pulse Rate: 75 Resp: 18 BP: 129/71 Patient Position (if appropriate): Lying Oxygen Therapy SpO2: 99 % O2 Device: Not Delivered   See Function Navigator for Current Functional Status.   Therapy/Group: Individual Therapy  Golden Popustin E Gus Littler 09/28/2016, 7:55 AM

## 2016-09-28 NOTE — Progress Notes (Signed)
Occupational Therapy Session Note  Patient Details  Name: Kelsey Smith MRN: [id number] Date of Birth: 15-Apr-1961  Today's Date: 09/28/2016 OT Individual Time: [id number] OT Individual Time Calculation (min): 45 min  and Today's Date: 09/28/2016 OT Missed Time: 15 Minutes Missed Time Reason: Patient fatigue     Short Term Goals: Week 1:  OT Short Term Goal 1 (Week 1): Pt will perform 3/3 toileting with mod A for balance  OT Short Term Goal 2 (Week 1): Pt will don Lb clothing with min A sit to stand OT Short Term Goal 3 (Week 1): Pt will attend to functional task for 10 min with minimal cuing  OT Short Term Goal 4 (Week 1): Pt will demonstrate emergent awareness in functional context with min questioning cues  Skilled Therapeutic Interventions/Progress Updates:    Pt asleep upon arrival.  Pt required min verbal cues to arouse but unable to keep her eyes open.  Pt stated she didn't feel well but unable to be specific.  Pt sat EOB but continued to lay back into bed stating that she was very tired.  Pt declined eating breakfast with the exception of one bite of sausage and one bite of egg.  Pt declined bathing this morning and does not have any clothing at this time.  Pt returned to bed and remained in bed with bed alarm activated and all needs within reach.  Focus on activity tolerance, sitting balance, task initiation, attention to task, and safety awareness.   Therapy Documentation Precautions:  Precautions Precautions: Fall Restrictions Weight Bearing Restrictions: No General: General OT Amount of Missed Time: 15 Minutes Pain:  no s/s pain  See Function Navigator for Current Functional Status.   Therapy/Group: Individual Therapy  Rich BraveLanier, Kriya Westra Chappell 09/28/2016, 8:33 AM

## 2016-09-28 NOTE — Progress Notes (Signed)
[facility] PHYSICAL MEDICINE & REHABILITATION     PROGRESS NOTE    Subjective/Complaints: Lying in bed. Took a few bites of breakfast. Not eating a lot in general.   ROS: pt denies nausea, vomiting, diarrhea, cough, shortness of breath or chest pain   Objective: Vital Signs: Blood pressure 129/71, pulse 75, temperature 98.6 F (37 C), temperature source Oral, resp. rate 18, weight 49.4 kg (108 lb 14.4 oz), SpO2 99 %. No results found.  Recent Labs  09/26/16 0631 09/27/16 0534  WBC 9.1 10.8*  HGB 12.6 13.1  HCT 37.8 38.4  PLT 292 316    Recent Labs  09/26/16 0631 09/27/16 0534  NA 140 135  K 3.7 3.3*  CL 107 101  GLUCOSE 113* 120*  BUN 10 9  CREATININE 0.72 0.50  CALCIUM 9.3 9.9   CBG (last 3)  No results for input(s): GLUCAP in the last 72 hours.  Wt Readings from Last 3 Encounters:  09/27/16 49.4 kg (108 lb 14.4 oz)  09/24/16 47.1 kg (103 lb 13.4 oz)    Physical Exam:  Constitutional: She is oriented to person, place, and time. She appears well-developed.  HENT:  Head: Normocephalic and atraumatic.  Eyes: PERRL.  Neck: Normal range of motion. Neck supple.  Cardiovascular: RRR   Respiratory: CTA bilaterally without wheezes  GI: Soft. Bowel sounds are normal. She exhibits no distension. There is no tenderness.  Musculoskeletal: She exhibits no edema or tenderness except around right knee.  Neurological: She is oriented to person, place, and time and easily aroused this morning.  Marland Kitchen  RUE with persistent ataxia.  Poor attention.  Lacks insight into deficits.  Left central 7 Motor: R UE 4/5 proximal to distal, LUE 5/5 R LE 4/5 proximal to distal. LLE 4+ to 5/5. Sensation intact to light touch DTRs symmetric  Skin: Skin is warm and dry.  Right shin /media left knee wounds dry/intact, healing Psychiatric: Her affect is blunt.    Assessment/Plan: 1. Functional and mobility deficits secondary to left thalamic ICH which require 3+ hours per day of  interdisciplinary therapy in a comprehensive inpatient rehab setting. Physiatrist is providing close team supervision and 24 hour management of active medical problems listed below. Physiatrist and rehab team continue to assess barriers to discharge/monitor patient progress toward functional and medical goals.  Function:  Bathing Bathing position   Position: Shower  Bathing parts Body parts bathed by patient: Right arm, Chest, Abdomen, Front perineal area, Buttocks, Right upper leg, Left upper leg, Left lower leg, Back Body parts bathed by helper: Right lower leg, Left arm  Bathing assist Assist Level: Touching or steadying assistance(Pt > 75%)      Upper Body Dressing/Undressing Upper body dressing   What is the patient wearing?: Hospital gown                Upper body assist Assist Level: Touching or steadying assistance(Pt > 75%)      Lower Body Dressing/Undressing Lower body dressing   What is the patient wearing?: Non-skid slipper socks, Hospital Gown           Non-skid slipper socks- Performed by helper: Don/doff right sock, Don/doff left sock                  Lower body assist Assist for lower body dressing: Touching or steadying assistance (Pt > 75%)      Toileting Toileting   Toileting steps completed by patient: Performs perineal hygiene Toileting steps completed by helper:  Performs perineal hygiene    Toileting assist Assist level: Touching or steadying assistance (Pt.75%)   Transfers Chair/bed transfer     Chair/bed transfer assist level: Moderate assist (Pt 50 - 74%/lift or lower)       Locomotion Ambulation     Max distance: 15 Assist level: 2 helpers   Wheelchair   Type: Manual Max wheelchair distance: 20 Assist Level: Moderate assistance (Pt 50 - 74%)  Cognition Comprehension Comprehension assist level: Understands basic 90% of the time/cues < 10% of the time  Expression Expression assist level: Expresses complex 90% of the  time/cues < 10% of the time  Social Interaction Social Interaction assist level: Interacts appropriately 90% of the time - Needs monitoring or encouragement for participation or interaction.  Problem Solving Problem solving assist level: Solves complex 90% of the time/cues < 10% of the time  Memory Memory assist level: Recognizes or recalls 90% of the time/requires cueing < 10% of the time   Medical Problem List and Plan: 1.  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  -continue CIR therapies.  2.  DVT Prophylaxis/Anticoagulation: Pharmaceutical: Lovenox 3. Pain Management: tylenol prn.  4. Mood: LCSW to follow for evaluation and support.   -neuropsych eval while here.  -on buspar and paxil currently  -more fatigued than anything else still 5. Neuropsych: This patient is not fully capable of making decisions on her own behalf. 6. Right leg wound/Skin/Wound Care: Aquacel to absorb drainage. Wounds healing appropriately  7. Fluids/Electrolytes/Nutrition: encourage PO  -will ask RD for recs.   -supplementing potassium  -consider megace trial 8. HTN: On lisinopril.   -NTG paste prn for elevated BS.  -fair control with periodic elevations 9. Polysubstance abuse: has completed CIWA protocol. Counsel. 10 Somnolence: Likely due to ICH. Monitor for now.   -appears more alert. Still fatigues quite easily though  -sleep fair to good   LOS (Days) 2 A FACE TO FACE EVALUATION WAS PERFORMED  Ranelle OysterSWARTZ,ZACHARY T, MD 09/28/2016 10:18 AM

## 2016-09-28 NOTE — Progress Notes (Addendum)
Occupational Therapy Session Note  Patient Details  Name: Kelsey Smith MRN: [id number] Date of Birth: 1961-05-14  Today's Date: 09/28/2016 OT Individual Time: 1425-1448 OT Individual Time Calculation (min): 23 min     Short Term Goals:Week 1:  OT Short Term Goal 1 (Week 1): Pt will perform 3/3 toileting with mod A for balance  OT Short Term Goal 2 (Week 1): Pt will don Lb clothing with min A sit to stand OT Short Term Goal 3 (Week 1): Pt will attend to functional task for 10 min with minimal cuing  OT Short Term Goal 4 (Week 1): Pt will demonstrate emergent awareness in functional context with min questioning cues  Skilled Therapeutic Interventions/Progress Updates:    Pt seen for OT session focusing on upright tolerance/ alertness and R neuro re-ed. Pt asleep in supine, easily awoken and with encouragement agreeable to tx EOB. She transferred to EOB with supervision and VCs for technique.  Pt introduced to therapy schedule from today. She was unable to recall any therapy she received today. Educated regarding role of each discipline, however, pt demonstrated poor carryover of education when asked several minutes later of each role. Seated EOB, pt completed fine motor task of removing and replacing caps from self-care items using R UE at dominant level and L as stabilizer. Able to complete all size container lids with increased time and encouragement.  She then completed fine motor task utilizing small figurines. She required VCs and demonstration for problem solving for increased success. Pt voicing increased fatigue and declined any further activity. She required min-mod A to stand to move further up in bed with VCs for technique and safety awareness. She returned to supine and left with all needs in reach and bed alarm on.   Therapy Documentation Precautions:  Precautions Precautions: Fall Restrictions Weight Bearing Restrictions: No Pain:   No/ denies pain ADL: ADL ADL Comments:  see functional navigator  See Function Navigator for Current Functional Status.   Therapy/Group: Individual Therapy  Smith, Kelsey Ayuso C 09/28/2016, 7:09 AM

## 2016-09-28 NOTE — Progress Notes (Signed)
Physical Therapy Note  Patient Details  Name: Kelsey Smith MRN: [id number] Date of Birth: 06/06/1961 Today's Date: 09/28/2016    Time: 827-905 38 minutes  1:1 Pt with no c/o pain. Pt agreeable to participate in PT session. Pt performed stand pivot transfers with min/mod A, cues for safety and hand placement each attempt, pt impulsive with movements during transfers. Pt able to perform sit to stand multiple attempts with 1 UE support and able to stand with min A for hygiene after toileting.  Gait with RW with Rt handsplint x 40' with mod A for Rt LE advanacement, wt shift and cues for safety and RW control.  Pt then states she is too fatigued to stand any more.  Nu step x 3 minutes level 2 with 2 rest breaks due to fatigue. Attempted w/c with hemi technique with pt continuing to require min A for steering, able to improve with repetition.  Pt missed final 20 minutes of session due to fatigue.   Geral Tuch 09/28/2016, 9:18 AM

## 2016-09-28 NOTE — Progress Notes (Signed)
Speech Language Pathology Daily Session Note  Patient Details  Name: Kelsey Smith MRN: [id number] Date of Birth: 09/01/1961  Today's Date: 09/28/2016 SLP Individual Time: 0935-1005 SLP Individual Time Calculation (min): 30 min   Short Term Goals: Week 1: SLP Short Term Goal 1 (Week 1): Patient will demonstrate sustained attention to task for 5 minutes with Max A verbal cues for redirection.  SLP Short Term Goal 2 (Week 1): Patient will identify 2 cognitive and 2 physical deficits with Mod A question cues.  SLP Short Term Goal 3 (Week 1): Patient will demonstrate functional problem solving with Min A verbal cues.  SLP Short Term Goal 4 (Week 1): Patient will utilize schedule to anticipate upcoming therapy appointments, etc with Min A question cues.   Skilled Therapeutic Interventions: Skilled treatment session focused on cognitive goals. Upon arrival, patient was asleep in bed but easily awakened. Patient sat EOB and transferred to the wheelchair with extra time and Mod A verbal cues for initiation and performed self-care tasks at the sink with Min A verbal cues for problem solving. SLP also facilitated session by providing extra time and supervision question cues for problem solving during a basic money management task. Patient demonstrated sustained attention to all tasks for ~30 minutes with supervision verbal cues for redirection without any attempts to lay down on the table or in chair. Patient missed remaining 30 minutes of session due to severe fatigue and lethargy. Patient left supine in bed with alarm on. Continue with current plan of care.   Function:  Cognition Comprehension Comprehension assist level: Understands basic 90% of the time/cues < 10% of the time  Expression   Expression assist level: Expresses complex 90% of the time/cues < 10% of the time  Social Interaction Social Interaction assist level: Interacts appropriately 90% of the time - Needs monitoring or encouragement  for participation or interaction.  Problem Solving Problem solving assist level: Solves basic 90% of the time/requires cueing < 10% of the time  Memory Memory assist level: Recognizes or recalls 75 - 89% of the time/requires cueing 10 - 24% of the time    Pain No/Denies Pain   Therapy/Group: Individual Therapy  Bettylee Feig 09/28/2016, 3:32 PM

## 2016-09-28 NOTE — IPOC Note (Signed)
Overall Plan of Care [facility](IPOC) Patient Details Name: Kelsey Smith MRN: [id number] DOB: 07/24/1961  Admitting Diagnosis: traumatic IPH  Hospital Problems: Active Problems:   Intraparenchymal hemorrhage of brain (HCC) - L thalamic     Functional Problem List: Nursing Bladder, Bowel, Endurance, Medication Management, Motor, Nutrition, Pain, Safety, Skin Integrity  PT Balance, Endurance, Motor, Pain, Safety  OT Balance, Safety, Behavior, Sensory, Cognition, Skin Integrity, Vision, Edema, Endurance, Motor, Nutrition, Pain, Perception  SLP    TR         Basic ADL's: OT Grooming, Bathing, Dressing, Eating, Toileting     Advanced  ADL's: OT Simple Meal Preparation     Transfers: PT Bed Mobility, Bed to Chair, Car, Furniture, Floor  OT Toilet, Tub/Shower     Locomotion: PT Stairs, Psychologist, prison and probation servicesWheelchair Mobility, Ambulation     Additional Impairments: OT Fuctional Use of Upper Extremity  SLP Social Cognition   Social Interaction, Problem Solving, Memory, Attention, Awareness  TR      Anticipated Outcomes Item Anticipated Outcome  Self Feeding mod I   Swallowing      Basic self-care  supervision  Toileting  supervision   Bathroom Transfers supervision  Bowel/Bladder  managed mod assist   Transfers  supervision  Locomotion  supervision gait  Communication     Cognition  Supervision   Pain  less than 2  Safety/Judgment  mod I   Therapy Plan: PT Intensity: Minimum of 1-2 x/day ,45 to 90 minutes PT Frequency: 5 out of 7 days PT Duration Estimated Length of Stay: 17-21 days OT Intensity: Minimum of 1-2 x/day, 45 to 90 minutes OT Frequency: 5 out of 7 days OT Duration/Estimated Length of Stay: ~2 1/2 weeks SLP Intensity: Minumum of 1-2 x/day, 30 to 90 minutes SLP Frequency: 3 to 5 out of 7 days SLP Duration/Estimated Length of Stay: 2.5-3 weeks        Team Interventions: Nursing Interventions Patient/Family Education, Bladder Management, Bowel Management, Disease  Management/Prevention, Pain Management, Medication Management, Skin Care/Wound Management, Cognitive Remediation/Compensation, Discharge Planning, Psychosocial Support  PT interventions Ambulation/gait training, Cognitive remediation/compensation, Discharge planning, DME/adaptive equipment instruction, Functional mobility training, Pain management, Splinting/orthotics, Therapeutic Activities, UE/LE Strength taining/ROM, Wheelchair propulsion/positioning, UE/LE Coordination activities, Therapeutic Exercise, Stair training, Patient/family education, Neuromuscular re-education, Functional electrical stimulation, Disease management/prevention, FirefighterCommunity reintegration, Warden/rangerBalance/vestibular training  OT Interventions Warden/rangerBalance/vestibular training, Discharge planning, Functional electrical stimulation, Pain management, Self Care/advanced ADL retraining, Therapeutic Activities, UE/LE Coordination activities, Visual/perceptual remediation/compensation, Therapeutic Exercise, Skin care/wound managment, Patient/family education, Functional mobility training, Disease mangement/prevention, Cognitive remediation/compensation, FirefighterCommunity reintegration, Fish farm managerDME/adaptive equipment instruction, Neuromuscular re-education, Psychosocial support, Splinting/orthotics, UE/LE Strength taining/ROM  SLP Interventions Cognitive remediation/compensation, Financial traderCueing hierarchy, Functional tasks, Patient/family education, Internal/external aids, Environmental controls, Therapeutic Activities  TR Interventions    SW/CM Interventions Discharge Planning, Psychosocial Support, Patient/Family Education    Team Discharge Planning: Destination: PT-Home ,OT- Home , SLP-Home Projected Follow-up: PT-Home health PT, OT-  Outpatient OT, SLP-Home Health SLP, 24 hour supervision/assistance Projected Equipment Needs: PT-To be determined, OT- To be determined, SLP-None recommended by SLP Equipment Details: PT- , OT-  Patient/family involved in discharge planning:  PT- Patient,  OT-Patient, SLP-Patient  MD ELOS: 18-20 days Medical Rehab Prognosis:  Excellent Assessment: The patient has been admitted for CIR therapies with the diagnosis of thalamic hemorrhage. The team will be addressing functional mobility, strength, stamina, balance, safety, adaptive techniques and equipment, self-care, bowel and bladder mgt, patient and caregiver education, NMR, arousal/stimulation, visual-perceptual awareness, cognition, communication, family ed. Goals have been set at supervision for basic  self-care, mobility and cognition. Progress has been slowed by cognition,arousal.    Ranelle Oyster, MD, [facility]      See Team Conference Notes for weekly updates to the plan of care

## 2016-09-29 ENCOUNTER — Inpatient Hospital Stay (HOSPITAL_COMMUNITY): Payer: Medicaid Other | Admitting: Speech Pathology

## 2016-09-29 ENCOUNTER — Inpatient Hospital Stay (HOSPITAL_COMMUNITY): Payer: Medicaid Other

## 2016-09-29 ENCOUNTER — Inpatient Hospital Stay (HOSPITAL_COMMUNITY): Payer: Self-pay | Admitting: Occupational Therapy

## 2016-09-29 DIAGNOSIS — G8191 Hemiplegia, unspecified affecting right dominant side: Secondary | ICD-10-CM

## 2016-09-29 MED ORDER — ATORVASTATIN CALCIUM 10 MG PO TABS
10.0000 mg | ORAL_TABLET | Freq: Every day | ORAL | Status: DC
  Administered 2016-09-29 – 2016-10-16 (×18): 10 mg via ORAL
  Filled 2016-09-29 (×19): qty 1

## 2016-09-29 NOTE — Progress Notes (Signed)
SLP Cancellation Note  Patient Details Name: Kelsey Smith MRN: [id number] DOB: 16-Jun-1961   Cancelled treatment:        Attempted to see pt for skilled ST.  Pt with complaints of nausea upon arrival and appeared very fatigued.  Attempted repositioning pt to maximize alertness but pt could not tolerate bed elevation change of more than 10 degrees before complaining of increased nausea and requesting to lay head of bed flat.  As nausea subsided, pt also was noted to become more lethargic and could only maintain alertness for ~10-15 seconds before requiring stimulation to awaken.  As a result, pt missed 30 minutes of scheduled ST.  Discussed with RN.                                                                                                  Carie Kapuscinski, Melanee SpryNicole L 09/29/2016, 12:15 PM

## 2016-09-29 NOTE — Progress Notes (Signed)
[facility] PHYSICAL MEDICINE & REHABILITATION     PROGRESS NOTE    Subjective/Complaints: Feels nauseated with poor appetite, pt claims no BM x >1 wk, no abd pain, no vomiting  ROS: pt denies, diarrhea, cough, shortness of breath or chest pain   Objective: Vital Signs: Blood pressure (!) 159/83, pulse 60, temperature 98.4 F (36.9 C), temperature source Oral, resp. rate 16, weight 46 kg (101 lb 6.6 oz), SpO2 100 %. No results found.  Recent Labs  09/27/16 0534  WBC 10.8*  HGB 13.1  HCT 38.4  PLT 316    Recent Labs  09/27/16 0534  NA 135  K 3.3*  CL 101  GLUCOSE 120*  BUN 9  CREATININE 0.50  CALCIUM 9.9   CBG (last 3)  No results for input(s): GLUCAP in the last 72 hours.  Wt Readings from Last 3 Encounters:  09/29/16 46 kg (101 lb 6.6 oz)  09/24/16 47.1 kg (103 lb 13.4 oz)    Physical Exam:  Constitutional: She is oriented to person, place, and time. She appears well-developed.  HENT:  Head: Normocephalic and atraumatic.  Eyes: PERRL.  Neck: Normal range of motion. Neck supple.  Cardiovascular: RRR   Respiratory: CTA bilaterally without wheezes  GI: Soft. Bowel sounds are normal. She exhibits no distension. There is no tenderness.  Musculoskeletal: She exhibits no edema or tenderness Neurological: She is oriented to person, place, and time and easily aroused this morning.  Marland Kitchen.  RUE with persistent ataxia.  Poor attention.  Lacks insight into deficits.  Left central 7 Motor: R UE 4/5 proximal to distal, LUE 5/5 R LE 4/5 proximal to distal. LLE 4+ to 5/5. Ataxia RUE Sensation intact to light touch DTRs symmetric  Skin: Skin is warm and dry.  Right shin /media left knee wounds dry/intact, healing Psychiatric: Her affect is blunt.    Assessment/Plan: 1. Functional and mobility deficits secondary to left thalamic ICH which require 3+ hours per day of interdisciplinary therapy in a comprehensive inpatient rehab setting. Physiatrist is providing close  team supervision and 24 hour management of active medical problems listed below. Physiatrist and rehab team continue to assess barriers to discharge/monitor patient progress toward functional and medical goals.  Function:  Bathing Bathing position   Position: Shower  Bathing parts Body parts bathed by patient: Right arm, Chest, Abdomen, Front perineal area, Buttocks, Right upper leg, Left upper leg, Left lower leg, Back Body parts bathed by helper: Right lower leg, Left arm  Bathing assist Assist Level: Touching or steadying assistance(Pt > 75%)      Upper Body Dressing/Undressing Upper body dressing   What is the patient wearing?: Hospital gown                Upper body assist Assist Level: Touching or steadying assistance(Pt > 75%)      Lower Body Dressing/Undressing Lower body dressing   What is the patient wearing?: Non-skid slipper socks, Hospital Gown           Non-skid slipper socks- Performed by helper: Don/doff right sock, Don/doff left sock                  Lower body assist Assist for lower body dressing: Touching or steadying assistance (Pt > 75%)      Toileting Toileting   Toileting steps completed by patient:  (No clothing) Toileting steps completed by helper: Performs perineal hygiene Toileting Assistive Devices: Grab bar or rail  Toileting assist Assist level: Touching or steadying  assistance (Pt.75%)   Transfers Chair/bed transfer     Chair/bed transfer assist level: Moderate assist (Pt 50 - 74%/lift or lower)       Locomotion Ambulation     Max distance: 15 Assist level: 2 helpers   Wheelchair   Type: Manual Max wheelchair distance: 20 Assist Level: Moderate assistance (Pt 50 - 74%)  Cognition Comprehension Comprehension assist level: Understands basic 90% of the time/cues < 10% of the time  Expression Expression assist level: Expresses complex 90% of the time/cues < 10% of the time  Social Interaction Social Interaction  assist level: Interacts appropriately 90% of the time - Needs monitoring or encouragement for participation or interaction.  Problem Solving Problem solving assist level: Solves complex 90% of the time/cues < 10% of the time  Memory Memory assist level: Recognizes or recalls 90% of the time/requires cueing < 10% of the time   Medical Problem List and Plan: 1.  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  -continue CIR PT, OT, SLP 2.  DVT Prophylaxis/Anticoagulation: Pharmaceutical: Lovenox 3. Pain Management: tylenol prn.  4. Mood: LCSW to follow for evaluation and support.   -neuropsych eval while here.  -on buspar and paxil currently  -more fatigued than anything else still 5. Neuropsych: This patient is not fully capable of making decisions on her own behalf. 6. Right leg wound/Skin/Wound Care: Aquacel to absorb drainage. Wounds healing appropriately  7. Fluids/Electrolytes/Nutrition: encourage PO  -appreciate dietary consult  -supplementing potassium  -consider megace trial 8. HTN: On lisinopril.   -NTG paste prn for elevated BS.  -fair control with periodic elevations 9. Polysubstance abuse: has completed CIWA protocol. Counsel. 10 Somnolence: Likely due to ICH. Monitor for now.   -appears more alert. Still fatigues quite easily though  -sleep fair to good 11.  Nausea- pt states she is constipated but flowsheets indicate inc stool , will check KUB  LOS (Days) 3 A FACE TO FACE EVALUATION WAS PERFORMED  Erick Colace, MD 09/29/2016 9:02 AM

## 2016-09-29 NOTE — Therapy (Signed)
Occupational Therapy Note:  Patient missed a 75 minute OT session today after a few attempts at participating and  several c/os of nausea, dizziness, and "I just feel awful."  Will offer OT next scheduled session/day

## 2016-09-29 NOTE — Progress Notes (Signed)
Occupational Therapy Session Note  Patient Details  Name: Kelsey Smith MRN: [id number] Date of Birth: [birth year]  Today's Date: 09/29/2016 OT Individual Time: [id number] OT Individual Time Calculation (min): 60 min     Short Term Goals: Week 1:  OT Short Term Goal 1 (Week 1): Pt will perform 3/3 toileting with mod A for balance  OT Short Term Goal 2 (Week 1): Pt will don Lb clothing with min A sit to stand OT Short Term Goal 3 (Week 1): Pt will attend to functional task for 10 min with minimal cuing  OT Short Term Goal 4 (Week 1): Pt will demonstrate emergent awareness in functional context with min questioning cues  Skilled Therapeutic Interventions/Progress Updates:   patient c/odizziness and nausea.   RN had previously gave anti nausea meds.   RN thinks patient will benefit from addtl med to help with dizziness and said she would check with the doctor.  Though patient c/o nausea, dizziness and "feel sick and not good at all," patient concurred to wash minimally, change brief.   She stated she wanted to dress but c/o that she was too sick to do so and elected to wear a hospital gown for ease.  Due to her c/o of extreme nausea and dizziness and not feeling well, she asked to ly down and rest.  She required overall max assist for bathing upper body and brief change.  Nurse tech came in to further assist the patient as needed.   Call bell within place    Therapy Documentation Precautions:  Precautions Precautions: Fall Restrictions Weight Bearing Restrictions: No  Pain:denied  See Function Navigator for Current Functional Status.   Therapy/Group: Individual Therapy  Bud Faceickett, Leonora Gores [facility] 09/29/2016, 8:53 AM

## 2016-09-29 NOTE — Progress Notes (Signed)
Occupational Therapy Session Note  Patient Details  Name: [name] L Slawinski MRN: [id number] Date of Birth: Feb 16, 1961  Today's Date: 09/29/2016 OT Individual Time: 1400-1431 OT Individual Time Calculation (min): 31 min   Short Term Goals: Week 1:  OT Short Term Goal 1 (Week 1): Pt will perform 3/3 toileting with mod A for balance  OT Short Term Goal 2 (Week 1): Pt will don Lb clothing with min A sit to stand OT Short Term Goal 3 (Week 1): Pt will attend to functional task for 10 min with minimal cuing  OT Short Term Goal 4 (Week 1): Pt will demonstrate emergent awareness in functional context with min questioning cues  Skilled Therapeutic Interventions/Progress Updates:   Therapeutic activity with focus on improved activity tolerance, sitting balance, transfers, RUE NMR.   Pt initially refusing therapy but participated with remotivational counseling provided and exhortation from family/friends in room.   Pt completed roll to right and sat at EOB with mod assist to lift trunk from head of bed with HOB elevated.   Pt donned left sock and required assist for right.  Pt re-educated on squat pivot transfer and completed transfer to her left side with mod assist and manual facilitation to weight-shift.   Pt was positioned at sink and groomed (hair care) with mod assist.   Pt then participated in seated D1/D2 PNF pattern at BUE and isolated RUE AROM mirroring OT movements unassisted for shoulder flexion/extension, elbow flex/ext and forearm sup/pronation with good effort and accuracy.   Pt reported fatigue after UE training and returned to bed with mod assist to complete squat-pivot transfer back to bed; independent with bed mobility.  Therapy Documentation Precautions:  Precautions Precautions: Fall Restrictions Weight Bearing Restrictions: No  Pain: Pain Assessment Pain Assessment: 0-10 Pain Score: 6  Pain Location: Head Pain Descriptors / Indicators: Aching Pain Onset: Gradual Pain  Intervention(s): Medication (See eMAR)  ADL: ADL ADL Comments: see functional navigator   See Function Navigator for Current Functional Status.   Therapy/Group: Individual Therapy  Pleasant Britz 09/29/2016, 2:33 PM

## 2016-09-30 ENCOUNTER — Inpatient Hospital Stay (HOSPITAL_COMMUNITY): Payer: Medicaid Other

## 2016-09-30 ENCOUNTER — Inpatient Hospital Stay (HOSPITAL_COMMUNITY): Payer: Medicaid Other | Admitting: Physical Therapy

## 2016-09-30 ENCOUNTER — Inpatient Hospital Stay (HOSPITAL_COMMUNITY): Payer: Self-pay | Admitting: Physical Therapy

## 2016-09-30 NOTE — Progress Notes (Signed)
Occupational Therapy Session Note  Patient Details  Name: Kelsey Smith MRN: [id number] Date of Birth: 24-Nov-1960  Today's Date: 09/30/2016 OT Individual Time: 0800-0900 OT Individual Time Calculation (min): 60 min   Short Term Goals: Week 1:  OT Short Term Goal 1 (Week 1): Pt will perform 3/3 toileting with mod A for balance  OT Short Term Goal 2 (Week 1): Pt will don Lb clothing with min A sit to stand OT Short Term Goal 3 (Week 1): Pt will attend to functional task for 10 min with minimal cuing  OT Short Term Goal 4 (Week 1): Pt will demonstrate emergent awareness in functional context with min questioning cues  Skilled Therapeutic Interventions/Progress Updates:   ADL-retraining with focus on improved transfers, functional mobility using RW, toileting, bathing (at walk-in shower using bench) and NMR of RUE.   Pt received in bed with RN attending to meds.   Pt elected to eat a portion of her breakfast in bed prior to bathing and required setup to organize meal and mod vc to incorporate right hand in self-feeding task.   Pt was able to consume 30% of her oatmeal using her right hand initially as dominant hand although switching to her left due to poor frustration tolerance.   Pt then completed squat pivot transfer to w/c with mod assist and was escorted to bathroom doorway to ambulate using RW to toilet first and then to tub bench, completing transfers again with mod assist and mod vc for sequencing and technique.  OT provided mod assist to bring right LE forward during functional mobility as pt did not consistently attend to her right during mobility and was unable to swing right LE fully (dragging it and lifting to step only when cued).   Pt required min assist to undress from gown but bathed with only min assist as she enjoyed showering with hot water, using her right hand 25% of the time while bathing.   Pt dressed in only pants and shirt with overall mod assist to pull up her pants while  standing supported at sink and min assist to don long sleeve shirt.   Pt recovered to bed at end of session d/t fatigue with mod assist to complete transfer back from w/c to bed.  Therapy Documentation Precautions:  Precautions Precautions: Fall Restrictions Weight Bearing Restrictions: No   Vital Signs: Therapy Vitals BP: 138/68   Pain: No/Denies pain    ADL: ADL ADL Comments: see functional navigator   See Function Navigator for Current Functional Status.   Therapy/Group: Individual Therapy   Second session: Time:  1300-1330 Time Calculation (min):  30  min  Pain Assessment: No/denies pain   Skilled Therapeutic Interventions: UE strengthening, toileting, transfers, pt/family ed (ex-boyfriend [place]).   Pt received seated in her w/c with boyfriend present.   Pt and her boyfriend were re-educated on pt's need to participate in all therapy sessions and OT reinforced expectation to resist excessive bed rest as counterproductive to treatment goals and d/c plan.   OT provided initial instruction on BUE seated HEP and pt completed 1 set of chest press, scapular chest pull and scapular pull down before requesting assist to toilet to void urine.   Pt performed transfer with mod assist for safety and she completed hygiene unassisted; clothing management with mod assist.   Pt returned to her bed at end of session with all needs within reach and boyfriend remaining for visit.   See FIM for current functional status  Therapy/Group:  Individual Therapy  Kylo Gavin 09/30/2016, 10:01 AM

## 2016-09-30 NOTE — Progress Notes (Signed)
Social Work  Social Work Assessment and Plan  Patient Details  Name: Kelsey Smith MRN: [id number] Date of Birth: [birth year]  Today's Date: 09/28/2016  Problem List:  Patient Active Problem List   Diagnosis Date Noted  . IVH (intraventricular hemorrhage) (HCC) 09/26/2016  . B12 deficiency 09/26/2016  . Hypertensive emergency 09/26/2016  . Essential hypertension 09/26/2016  . Carotid aneurysm, left (HCC) 09/26/2016  . Open thigh wound 09/26/2016  . Somnolence   . Recurrent major depressive disorder (HCC)   . ETOH abuse   . Tobacco abuse   . Cocaine abuse   . Pure hypercholesterolemia   . 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 Anxiety   . Depression    Past Surgical History: No past surgical history on file. Social History:  reports that she has been smoking.  She has never used smokeless tobacco. She reports that she drinks alcohol. She reports that she does not use drugs.  Family / Support Systems Marital Status: Single Patient Roles: Other (Comment) (3 roomates) Other Supports: friend, Raiford Simmonds @ (C) [phone]; friend, Rosalio Loud @ (C) [phone]; friend, Richard --- all of these men are her roomates.  They have all lived together for ~ 8 yrs. Anticipated Caregiver: Raiford Simmonds is primary contact .  He and the other 2 roommates plan to stagger their schedules to assist pt. as needed.   Ability/Limitations of Caregiver: Vernia Buff works but says his employer has given him freedom to do what is needed to assist pt. upon return home Caregiver Availability: 24/7 Family Dynamics: Pt reports that she does have a sister living in [place], however, little contact.  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 Return to Work Plans: Pt hopeful she can return Fish farm manager Issues: None Guardian/Conservator: None - per MD, pt is not fully capable of making decisions on her own behalf.  Legally, will need to defer to family  - will discuss with pt and friends.   Abuse/Neglect Physical Abuse: Denies Verbal Abuse: Denies Sexual Abuse: Denies Exploitation of patient/patient's resources: Denies Self-Neglect: Denies  Emotional Status Pt's affect, behavior adn adjustment status: Pt appears very fatigued but agreeable to completing interview.  Rather flat affect and only offers brief answers.  She denies any significant emotional distress, however, have referred for neuropsychology consult to further screen coping and cognitive deficits. Recent Psychosocial Issues: leg injury ~ 3 months ago which prevented her from working. Pyschiatric History: Pt reports anxiety and depression that her primary MD has prescribed meds.  No formal counseling. Substance Abuse History: Pt easily confirms a substance abuse problem with ETOH and drugs - open to counseling while here and following d/c.    Patient / Family Perceptions, Expectations & Goals Pt/Family understanding of illness & functional limitations: Pt with very basic understanding of her hemorrhage due to aneurysm.  She cannot identify resulting deficits except, "I'm tired".  Friends with general understanding, however, education needed. Premorbid pt/family roles/activities: Pt had just begun working a limited schedule.  4 roomates living together for several years. Anticipated changes in roles/activities/participation: Pt expected to need 24/7 supervision and friends or family would need to assume caregiver duties. Pt/family expectations/goals: "I don't know...not feel bad."  [employer]: None Premorbid Home  Care/DME Agencies: None Resource referrals recommended: Neuropsychology,  Support group (specify)  Discharge Planning Living Arrangements: Non-relatives/Friends Support Systems: Friends/neighbors Type of Residence: Private residence Insurance Resources: Customer service managerelf-pay Financial Resources: Employment, Other (Comment) (roomates assisting with bills) Financial Screen Referred: Previously completed Living Expenses: Psychologist, sport and exerciseent Money Management: Patient Does the patient have any problems obtaining your medications?: Yes (Describe) (no insurance) Home Management: responsibilities shared by pt and roomates Patient/Family Preliminary Plans: Pt plans to d/c with roomates, however, d/c support available not confirmed.  Pt reports that all roomates are working.  Need to confirm d/c plan. Barriers to Discharge: Self care, Finances, Other (Comment) (roomate committment to care?) Social Work Anticipated Follow Up Needs: HH/OP, Support Group Expected length of stay: 17-21 days  Clinical Impression Unfortunate woman here following brain aneurysm rupture.  Appears very fatigued and difficult to engage in conversation.  Pt offers brief answers and denies any emotional distress about situation.  Still need to discuss d/c plan further with pt's roomates as goals are being set for supervision.  Pt with admitted h/o substance abuse and states she is willing to participate in counseling while here and at d/c.  Will follow for support and d/c planning needs.  Aquarius Tremper 09/28/2016, 2:49 PM

## 2016-09-30 NOTE — Care Management Note (Signed)
[facility] Individual Statement of Services  Patient Name:  Kelsey Smith  Date:  09/28/2016  Welcome to the [facility].  Our goal is to provide you with an individualized program based on your diagnosis and situation, designed to meet your specific needs.  With this comprehensive rehabilitation program, you will be expected to participate in at least 3 hours of rehabilitation therapies Monday-Friday, with modified therapy programming on the weekends.  Your rehabilitation program will include the following services:  Physical Therapy (PT), Occupational Therapy (OT), Speech Therapy (ST), 24 hour per day rehabilitation nursing, Therapeutic Recreaction (TR), Neuropsychology, Case Management (Social Worker), Rehabilitation Medicine, Nutrition Services and Pharmacy Services  Weekly team conferences will be held on Tuesdays to discuss your progress.  Your Social Worker will talk with you frequently to get your input and to update you on team discussions.  Team conferences with you and your family in attendance may also be held.  Expected length of stay: 17-21 days  Overall anticipated outcome: supervision  Depending on your progress and recovery, your program may change. Your Social Worker will coordinate services and will keep you informed of any changes. Your Social Worker's name and contact numbers are listed  below.  The following services may also be recommended but are not provided by the [facility]:   Driving Evaluations  Home Health Rehabiltiation Services  Outpatient Rehabilitation Services  Vocational Rehabilitation   Arrangements will be made to provide these services after discharge if needed.  Arrangements include referral to agencies that provide these services.  Your insurance has been verified to be:  None  Your primary doctor is:  None  Pertinent information will be shared with your doctor and your insurance  company.  Social Worker:  WileyLucy Sheba Whaling, [place] [phone] or ([phone]) [phone]   Information discussed with and copy given to patient by: Amada JupiterHOYLE, Jerek Meulemans, 09/28/2016, 2:52 PM

## 2016-09-30 NOTE — Progress Notes (Signed)
Physical Therapy Session Note  Patient Details  Name: Kelsey Smith MRN: [id number] Date of Birth: 1961/01/21  Today's Date: 09/30/2016 PT Individual Time: [id number] PT Individual Time Calculation (min): 40 min    Short Term Goals: Week 1:  PT Short Term Goal 1 (Week 1): Pt will perform functional transfers with min A PT Short Term Goal 2 (Week 1): Pt will perform gait with max A x 20' in controlled enviornment  Skilled Therapeutic Interventions/Progress Updates:    Treatment 1: Pt received in bed with eyes closed. Pt engaged in minimal conversation & required cuing to open eyes. Pt reported nausea & headache, reporting she would like to participate in therapy tomorrow. Notified RN of pt's complaints. Encouraged pt to sit on EOB & participate in treatment but pt continued to lay in bed with eyes closed refusing participation. Pt missed 60 minutes skilled PT treatment.   Treatment 2: Pt received in bed with visitors present for session & pt agreeable to tx. Pt noted HA & nausea & RN made aware. Session focused on gait training, RUE/LE neuro re-ed, endurance, & balance training. Gait training x 20 ft + 10 ft with RW, R UE orthosis & mod assist. Pt with poor RLE foot clearance & decreased weight shifting L with therapist providing manual facilitation to correct. Pt required max cuing for hand placement during transfers for increased safety. Pt performed BLE seated toe raises 10 reps x 2 sets with max cuing focusing on R ankle strengthening & coordination. Pt tolerated standing x 1 minute + 20 seconds + ~1 min 30 seconds without UE support, reaching with RUE to match cards on Velcro board. Pt fatigued quickly & required frequent seated rest breaks. Pt propelled w/c with BUE, min assist, and max cuing for technique for coordination of BUE & forced use of RUE. Pt agreeable to sitting in w/c with encouragement. At end of session pt left sitting in w/c with QRB donned, visitors present & all needs within  reach. Pt demonstrated proper use of call bell.   Therapy Documentation Precautions:  Precautions Precautions: Fall Restrictions Weight Bearing Restrictions: No   General: PT Amount of Missed Time (min): 60 Minutes PT Missed Treatment Reason: Patient fatigue;Other (Comment) (nausea & refusal)   See Function Navigator for Current Functional Status.   Therapy/Group: Individual Therapy  Sandi MariscalVictoria M Iam Lipson 09/30/2016, 3:45 PM

## 2016-09-30 NOTE — Progress Notes (Signed)
[facility]IC[facility]     PROGRESS NOTE    Subjective/Complaints: Pt states she has constant mild nausea, no vomiting, claims no BM for 2wks  ROS: pt denies, diarrhea, cough, shortness of breath or chest pain   Objective: Vital Signs: Blood pressure 138/68, pulse 71, temperature 97.7 F (36.5 C), temperature source Oral, resp. rate 16, weight 53.4 kg (117 lb 12.8 oz), SpO2 100 %. No results found. No results for input(s): WBC, HGB, HCT, PLT in the last 72 hours. No results for input(s): NA, K, CL, GLUCOSE, BUN, CREATININE, CALCIUM in the last 72 hours.  Invalid input(s): CO CBG (last 3)  No results for input(s): GLUCAP in the last 72 hours.  Wt Readings from Last 3 Encounters:  09/30/16 53.4 kg (117 lb 12.8 oz)  09/24/16 47.1 kg (103 lb 13.4 oz)    Physical Exam:  Constitutional: She is oriented to person, place, and time. She appears well-developed.  HENT:  Head: Normocephalic and atraumatic.  Eyes: PERRL.  Neck: Normal range of motion. Neck supple.  Cardiovascular: RRR   Respiratory: CTA bilaterally without wheezes  GI: Soft. Bowel sounds are normal. She exhibits no distension. There is no tenderness.  Musculoskeletal: She exhibits no edema or tenderness Neurological: She is oriented to person, place, and time and easily aroused this morning.  Marland Kitchen  RUE with persistent ataxia.  Poor attention.  Lacks insight into deficits.  Left central 7 Motor: R UE 4/5 proximal to distal, LUE 5/5 R LE 4/5 proximal to distal. LLE 4+ to 5/5. Ataxia RUE Sensation intact to light touch DTRs symmetric  Skin: Skin is warm and dry.  Right shin /media left knee wounds dry/intact, healing Psychiatric: Her affect is blunt.    Assessment/Plan: 1. Functional and mobility deficits secondary to left thalamic ICH which require 3+ hours per day of interdisciplinary therapy in a comprehensive inpatient rehab setting. Physiatrist is providing close team supervision and 24  hour management of active medical problems listed below. Physiatrist and rehab team continue to assess barriers to discharge/monitor patient progress toward functional and medical goals.  Function:  Bathing Bathing position   Position: Shower  Bathing parts Body parts bathed by patient: Right arm, Chest, Abdomen, Front perineal area, Buttocks, Right upper leg, Left upper leg, Left lower leg, Back Body parts bathed by helper: Right lower leg, Left arm  Bathing assist Assist Level: Touching or steadying assistance(Pt > 75%)      Upper Body Dressing/Undressing Upper body dressing   What is the patient wearing?: Hospital gown                Upper body assist Assist Level: Touching or steadying assistance(Pt > 75%)      Lower Body Dressing/Undressing Lower body dressing   What is the patient wearing?: Non-skid slipper socks, Hospital Gown           Non-skid slipper socks- Performed by helper: Don/doff right sock, Don/doff left sock                  Lower body assist Assist for lower body dressing: Touching or steadying assistance (Pt > 75%)      Toileting Toileting   Toileting steps completed by patient:  (No clothing) Toileting steps completed by helper: Performs perineal hygiene Toileting Assistive Devices: Grab bar or rail  Toileting assist Assist level: Touching or steadying assistance (Pt.75%)   Transfers Chair/bed transfer     Chair/bed transfer assist level: Moderate assist (Pt 50 -  74%/lift or lower)       Locomotion Ambulation     Max distance: 15 Assist level: 2 helpers   Wheelchair   Type: Manual Max wheelchair distance: 20 Assist Level: Moderate assistance (Pt 50 - 74%)  Cognition Comprehension Comprehension assist level: Understands basic 90% of the time/cues < 10% of the time  Expression Expression assist level: Expresses complex 90% of the time/cues < 10% of the time  Social Interaction Social Interaction assist level: Interacts  appropriately 90% of the time - Needs monitoring or encouragement for participation or interaction.  Problem Solving Problem solving assist level: Solves complex 90% of the time/cues < 10% of the time  Memory Memory assist level: Recognizes or recalls 90% of the time/requires cueing < 10% of the time   Medical Problem List and Plan: 1.  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  -continue CIR PT, OT, SLP 2.  DVT Prophylaxis/Anticoagulation: Pharmaceutical: Lovenox 3. Pain Management: tylenol prn.  4. Mood: LCSW to follow for evaluation and support.   -neuropsych eval while here.  -on buspar and paxil currently  -more fatigued than anything else still 5. Neuropsych: This patient is not fully capable of making decisions on her own behalf. 6. Right leg wound/Skin/Wound Care: Aquacel to absorb drainage. Wounds healing appropriately  7. Fluids/Electrolytes/Nutrition: encourage PO  -appreciate dietary consult  -supplementing potassium- recheck in am BMP Latest Ref Rng & Units 09/27/2016 09/26/2016 09/25/2016  Glucose 65 - 99 mg/dL 161(W120(H) 960(A113(H) 540(J103(H)  BUN 6 - 20 mg/dL 9 10 17   Creatinine 0.44 - 1.00 mg/dL 8.110.50 9.140.72 7.820.89  Sodium 135 - 145 mmol/L 135 140 137  Potassium 3.5 - 5.1 mmol/L 3.3(L) 3.7 3.8  Chloride 101 - 111 mmol/L 101 107 106  CO2 22 - 32 mmol/L 24 25 23   Calcium 8.9 - 10.3 mg/dL 9.9 9.3 9.2    -consider megace trial 8. HTN: On lisinopril.   -NTG paste prn for elevated BS.  -fair control with periodic elevations 9. Polysubstance abuse: has completed CIWA protocol. Counsel. 10 Somnolence: Likely due to ICH. Monitor for now.   -appears more alert. Still fatigues quite easily though  -sleep fair to good 11.  Nausea- pt inaccurate with Info states she hasn't had BM for >1wk but having daily inc BM per RN, quite insistent, check KUB, RN states in report 1/5 was last BM LOS (Days) 4 A FACE TO FACE EVALUATION WAS  PERFORMED  Erick ColaceKIRSTEINS,Travante Knee E, MD 09/30/2016 8:47 AM

## 2016-10-01 ENCOUNTER — Encounter (HOSPITAL_COMMUNITY): Payer: Self-pay

## 2016-10-01 ENCOUNTER — Inpatient Hospital Stay (HOSPITAL_COMMUNITY): Payer: Medicaid Other | Admitting: Physical Therapy

## 2016-10-01 ENCOUNTER — Inpatient Hospital Stay (HOSPITAL_COMMUNITY): Payer: Self-pay

## 2016-10-01 ENCOUNTER — Inpatient Hospital Stay (HOSPITAL_COMMUNITY): Payer: Medicaid Other | Admitting: Speech Pathology

## 2016-10-01 LAB — BASIC METABOLIC PANEL
ANION GAP: 10 (ref 5–15)
BUN: 16 mg/dL (ref 6–20)
CALCIUM: 9.8 mg/dL (ref 8.9–10.3)
CO2: 28 mmol/L (ref 22–32)
CREATININE: 0.87 mg/dL (ref 0.44–1.00)
Chloride: 98 mmol/L — ABNORMAL LOW (ref 101–111)
Glucose, Bld: 167 mg/dL — ABNORMAL HIGH (ref 65–99)
Potassium: 4.3 mmol/L (ref 3.5–5.1)
Sodium: 136 mmol/L (ref 135–145)

## 2016-10-01 LAB — CBC
HCT: 41.5 % (ref 36.0–46.0)
Hemoglobin: 13.5 g/dL (ref 12.0–15.0)
MCH: 32.5 pg (ref 26.0–34.0)
MCHC: 32.5 g/dL (ref 30.0–36.0)
MCV: 99.8 fL (ref 78.0–100.0)
PLATELETS: 454 10*3/uL — AB (ref 150–400)
RBC: 4.16 MIL/uL (ref 3.87–5.11)
RDW: 13.6 % (ref 11.5–15.5)
WBC: 9 10*3/uL (ref 4.0–10.5)

## 2016-10-01 LAB — PREALBUMIN: PREALBUMIN: 35.7 mg/dL (ref 18–38)

## 2016-10-01 MED ORDER — MEGESTROL ACETATE 400 MG/10ML PO SUSP
400.0000 mg | Freq: Two times a day (BID) | ORAL | Status: DC
  Administered 2016-10-01 – 2016-10-09 (×10): 400 mg via ORAL
  Filled 2016-10-01 (×16): qty 10

## 2016-10-01 NOTE — Progress Notes (Signed)
Physical Therapy Session Note  Patient Details  Name: Kelsey Smith MRN: [id number] Date of Birth: August 29, 1961  Today's Date: 10/01/2016 PT Individual Time: 1130-1200 PT Individual Time Calculation (min): 30 min    Short Term Goals: Week 1:  PT Short Term Goal 1 (Week 1): Pt will perform functional transfers with min A PT Short Term Goal 2 (Week 1): Pt will perform gait with max A x 20' in controlled enviornment  Skilled Therapeutic Interventions/Progress Updates:   Pt received supine in bed; c/o pain 5/10 in R shoulder and agreeable to treatment. Pt reports she is very fatigued from morning sessions. Supine<>sit at beginning/end of session with S. Stand pivot transfer x4 during session with min guard, no AD. Attempted w/c propulsion x10' however pt becomes frustrated and resistant to cueing on use of RUE for navigation. Pt dons B shoes however requests assist for tieing laces. Gait 3x20' with RW and min/modA. Theraband hung from RW on R side for visual cue for R step length due to R inattention; note improved step length however requires ongoing cues to maintain. Returned to room at end of session and transferred to bed minA as above. Remained supine in bed with all needs in reach and alarm intact at completion of session.   Therapy Documentation Precautions:  Precautions Precautions: Fall Restrictions Weight Bearing Restrictions: (P) No   See Function Navigator for Current Functional Status.   Therapy/Group: Individual Therapy  Vista Lawmanlizabeth J Tygielski 10/01/2016, 11:56 AM

## 2016-10-01 NOTE — Progress Notes (Signed)
Speech Language Pathology Daily Session Note  Patient Details  Name: Kelsey Smith MRN: [id number] Date of Birth: 1961-05-14  Today's Date: 10/01/2016 SLP Individual Time: 0730-0810 SLP Individual Time Calculation (min): 40 min   Short Term Goals: Week 1: SLP Short Term Goal 1 (Week 1): Patient will demonstrate sustained attention to task for 5 minutes with Max A verbal cues for redirection.  SLP Short Term Goal 2 (Week 1): Patient will identify 2 cognitive and 2 physical deficits with Mod A question cues.  SLP Short Term Goal 3 (Week 1): Patient will demonstrate functional problem solving with Min A verbal cues.  SLP Short Term Goal 4 (Week 1): Patient will utilize schedule to anticipate upcoming therapy appointments, etc with Min A question cues.   Skilled Therapeutic Interventions: Skilled treatment session focused on cognitive goals. SLP facilitated session by providing extra time and Min A verbal cues for arousal. Patient agreeable to get into wheelchair but required Max A verbal cues for problem solving donning pants and for transfer to wheelchair due to impulsivity. Patient performed basic self-care tasks at sink with Mod A verbal cues for initiation and problem solving. Patient then consumed breakfast meal with Min A verbal cues needed for tray set-up and Mod A verbal cues for sustained attention to task for ~10 minutes. SLP also facilitated session by reviewing her current medications. Patient required total A to recall her current medications and their functions. Patient transferred back to bed at end of session with all needs within reach. Continue with current plan of care.   Function:   Cognition Comprehension Comprehension assist level: Understands basic 90% of the time/cues < 10% of the time  Expression   Expression assist level: Expresses complex 90% of the time/cues < 10% of the time  Social Interaction Social Interaction assist level: Interacts appropriately 50 - 74% of  the time - May be physically or verbally inappropriate.  Problem Solving Problem solving assist level: Solves basic 50 - 74% of the time/requires cueing 25 - 49% of the time  Memory Memory assist level: Recognizes or recalls 50 - 74% of the time/requires cueing 25 - 49% of the time    Pain No/Denies Pain   Therapy/Group: Individual Therapy  Selby Slovacek 10/01/2016, 12:24 PM

## 2016-10-01 NOTE — Progress Notes (Signed)
Occupational Therapy Session Note  Patient Details  Name: Kelsey Smith MRN: [id number] Date of Birth: Aug 07, 1961  Today's Date: 10/01/2016 OT Individual Time: [id number] OT Individual Time Calculation (min): 54 min     Short Term Goals: Week 1:  OT Short Term Goal 1 (Week 1): Pt will perform 3/3 toileting with mod A for balance  OT Short Term Goal 2 (Week 1): Pt will don Lb clothing with min A sit to stand OT Short Term Goal 3 (Week 1): Pt will attend to functional task for 10 min with minimal cuing  OT Short Term Goal 4 (Week 1): Pt will demonstrate emergent awareness in functional context with min questioning cues  Skilled Therapeutic Interventions/Progress Updates:    Pt asleep upon arrival with friend present at bedside.  Pt easily aroused and declined bathing/dressing this morning.  Pt transitioned to gym and engaged in dynamic sitting tasks with focus on RUE use.  Dynamic standing tasks attempted with mod A to weight shift to RLE and maintain standing balance.  Pt used her RUE at diminished level to retrieve and replace cards on card board.  Pt was able to attend to task at supervision level but attempted to lay back onto therapy mat when not actively engaged.  Pt returned to room/bed and remained in bed with all needs within reach and bed alarm activated.  Therapy Documentation Precautions:  Precautions Precautions: Fall Restrictions Weight Bearing Restrictions: (P) No General: General OT Amount of Missed Time: 6 Minutes   Pain:    See Function Navigator for Current Functional Status.   Therapy/Group: Individual Therapy  Rich BraveLanier, Pixie Burgener Chappell 10/01/2016, 10:08 AM

## 2016-10-01 NOTE — Progress Notes (Addendum)
Physical Therapy Session Note  Patient Details  Name: Kelsey Smith On MRN: [id number] Date of Birth: 02-12-1961  Today's Date: 10/01/2016 PT Individual Time: [id number] PT Individual Time Calculation (min): 28 min    Short Term Goals: Week 1:  PT Short Term Goal 1 (Week 1): Pt will perform functional transfers with min A PT Short Term Goal 2 (Week 1): Pt will perform gait with max A x 20' in controlled enviornment  Skilled Therapeutic Interventions/Progress Updates:    Pt received in bed & agreeable to tx. Pt transferred supine<>sitting with supervision, cuing for RLE awareness, and use of hospital bed features. Pt required max cuing for safety with transfers as she attempted to transfer to w/c without assistance from therapist. Pt requires mod assist to complete squat pivot bed<>w/c with max cuing for hand placement & technique and manual facilitation for weight shifting. Transported pt to rehab gym and attempted to engage pt in peg board task. Pt able to complete 1 pattern with RUE for forced use of extremity & neuro re-ed. Pt repeatedly stated "I don't feel good." and "I can't do it." throughout session. Despite max encouragement and attempts at redirection pt continued to repeat these statements, and became labile multiple times. Notified RN of pt's c/o pain, nausea & overall not feeling well. Encouraged pt to propel w/c back to room with BUE for coordination & use of RUE; pt able to propel w/c ~5 ft x 2 then would stop & state "I can't do it!". Returned pt to room and assisted her back to bed. Educated pt on maximum recovery occurring within first six months of medical event and encouraged her to participate in all therapies. At end of session pt left supine in bed with all needs within reach & bed alarm set.   Pt missed 17 minutes of skilled PT treatment 2/2 unwillingness to participate and fatigue.  Notified LCSW of pt's potential need of neuropsych eval; LCSW reports this was completed  today.  Therapy Documentation Precautions:  Precautions Precautions: Fall Restrictions Weight Bearing Restrictions: No General: PT Amount of Missed Time (min): 17 Minutes PT Missed Treatment Reason: Patient unwilling to participate   See Function Navigator for Current Functional Status.   Therapy/Group: Individual Therapy  Sandi MariscalVictoria M Jacquelynn Friend 10/01/2016, 5:22 PM

## 2016-10-01 NOTE — Progress Notes (Signed)
[facility] PHYSICAL MEDICINE & REHABILITATION     PROGRESS NOTE    Subjective/Complaints: Still some nausea. Moved bowels yesterday. Up with therapy already this morning.   ROS: pt denies nausea, vomiting, diarrhea, cough, shortness of breath or chest pain   Objective: Vital Signs: Blood pressure 117/63, pulse 68, temperature 98 F (36.7 C), temperature source Oral, resp. rate 16, weight 52.6 kg (115 lb 15.4 oz), SpO2 100 %. Dg Abd Portable 1v  Result Date: 09/30/2016 CLINICAL DATA:  Nausea for 2 days EXAM: PORTABLE ABDOMEN - 1 VIEW COMPARISON:  None. FINDINGS: Scattered large and small bowel gas are seen. Fecal material is noted throughout the colon. No obstructive changes are seen. No free air is noted. No acute bony abnormality is seen. IMPRESSION: No acute abnormality noted. Electronically Signed   By: Alcide Clever M.D.   On: 09/30/2016 13:36   No results for input(s): WBC, HGB, HCT, PLT in the last 72 hours. No results for input(s): NA, K, CL, GLUCOSE, BUN, CREATININE, CALCIUM in the last 72 hours.  Invalid input(s): CO CBG (last 3)  No results for input(s): GLUCAP in the last 72 hours.  Wt Readings from Last 3 Encounters:  09/30/16 52.6 kg (115 lb 15.4 oz)  09/24/16 47.1 kg (103 lb 13.4 oz)    Physical Exam:  Constitutional: She is oriented to person, place, and time. She appears well-developed.  HENT:  Head: Normocephalic and atraumatic.  Eyes: PERRL.  Neck: Normal range of motion. Neck supple.  Cardiovascular: RRR   Respiratory: clear bilaterally. No wheezes  GI: Soft. Bowel sounds are normal. She exhibits no distension. There is no tenderness.  Musculoskeletal: She exhibits no edema or tenderness Neurological: She is oriented to person, place, and time and easily aroused this morning.  Marland Kitchen  RUE with persistent ataxia.  Limited engagement/ attention.  Lacks insight into deficits.  Left central 7 Motor: R UE 4/5 proximal to distal, LUE 5/5 R LE 4/5 proximal to  distal. LLE 4+ to 5/5. Ataxia RUE Sensation intact to light touch DTRs symmetric  Skin: Skin is warm and dry.  Right shin /media left knee wounds  healing Psychiatric: Her affect is blunt.    Assessment/Plan: 1. Functional and mobility deficits secondary to left thalamic ICH which require 3+ hours per day of interdisciplinary therapy in a comprehensive inpatient rehab setting. Physiatrist is providing close team supervision and 24 hour management of active medical problems listed below. Physiatrist and rehab team continue to assess barriers to discharge/monitor patient progress toward functional and medical goals.  Function:  Bathing Bathing position   Position: Shower  Bathing parts Body parts bathed by patient: Right arm, Chest, Abdomen, Front perineal area, Buttocks, Right upper leg, Left upper leg, Left lower leg, Back Body parts bathed by helper: Right lower leg, Left arm  Bathing assist Assist Level: Touching or steadying assistance(Pt > 75%)      Upper Body Dressing/Undressing Upper body dressing   What is the patient wearing?: Hospital gown                Upper body assist Assist Level: Touching or steadying assistance(Pt > 75%)      Lower Body Dressing/Undressing Lower body dressing   What is the patient wearing?: Non-skid slipper socks, Hospital Gown           Non-skid slipper socks- Performed by helper: Don/doff right sock, Don/doff left sock  Lower body assist Assist for lower body dressing: Touching or steadying assistance (Pt > 75%)      Toileting Toileting   Toileting steps completed by patient:  (No clothing) Toileting steps completed by helper: Performs perineal hygiene Toileting Assistive Devices: Grab bar or rail  Toileting assist Assist level: Touching or steadying assistance (Pt.75%)   Transfers Chair/bed transfer   Chair/bed transfer method: Squat pivot Chair/bed transfer assist level: Moderate assist (Pt 50 -  74%/lift or lower) Chair/bed transfer assistive device: Armrests     Locomotion Ambulation     Max distance: 20 ft Assist level: Moderate assist (Pt 50 - 74%)   Wheelchair   Type: Manual Max wheelchair distance: 100 ft Assist Level: Touching or steadying assistance (Pt > 75%)  Cognition Comprehension Comprehension assist level: Understands basic 90% of the time/cues < 10% of the time  Expression Expression assist level: Expresses complex 90% of the time/cues < 10% of the time  Social Interaction Social Interaction assist level: Interacts appropriately 90% of the time - Needs monitoring or encouragement for participation or interaction.  Problem Solving Problem solving assist level: Solves complex 90% of the time/cues < 10% of the time  Memory Memory assist level: Recognizes or recalls 90% of the time/requires cueing < 10% of the time   Medical Problem List and Plan: 1.  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  -continue CIR PT, OT, SLP therapies 2.  DVT Prophylaxis/Anticoagulation: Pharmaceutical: Lovenox 3. Pain Management: tylenol prn.  4. Mood: LCSW to follow for evaluation and support.   -neuropsych eval while here.  -on buspar and paxil currently 5. Neuropsych: This patient is not fully capable of making decisions on her own behalf. 6. Right leg wound/Skin/Wound Care: Aquacel to absorb drainage. Wounds healing appropriately  7. Fluids/Electrolytes/Nutrition: encourage PO  -appreciate dietary consult  -supplementing potassium-  -recheck this week BMP Latest Ref Rng & Units 09/27/2016 09/26/2016 09/25/2016  Glucose 65 - 99 mg/dL 086(V120(H) 784(O113(H) 962(X103(H)  BUN 6 - 20 mg/dL 9 10 17   Creatinine 0.44 - 1.00 mg/dL 5.280.50 4.130.72 2.440.89  Sodium 135 - 145 mmol/L 135 140 137  Potassium 3.5 - 5.1 mmol/L 3.3(L) 3.7 3.8  Chloride 101 - 111 mmol/L 101 107 106  CO2 22 - 32 mmol/L 24 25 23   Calcium 8.9 - 10.3 mg/dL 9.9 9.3 9.2     -eating a little more.  -begin megace trial 8. HTN: On lisinopril.   -NTG paste prn for elevated BS.  -fair control with periodic elevations 9. Polysubstance abuse: has completed CIWA protocol. Counsel. 10 Somnolence: Likely due to ICH. Monitor for now.   -appears more alert. Still fatigues quite easily though  -sleep fair to good 11.  Nausea- ?bowel related  -stool in colon on KUB---large BM yesterday  -continue to follow  -Potential behavioral component.    LOS (Days) 5 A FACE TO FACE EVALUATION WAS PERFORMED  Ranelle OysterSWARTZ,ZACHARY T, MD 10/01/2016 9:02 AM

## 2016-10-01 NOTE — Progress Notes (Signed)
Occupational Therapy Note  Patient Details  Name: Kelsey Smith MRN: [id number] Date of Birth: 01-06-1961  Today's Date: 10/01/2016 OT Individual Time: [id number] OT Individual Time Calculation (min): 28 min    Pt denied pain Individual Therapy  Pt asleep upon arrival but easily aroused.  Pt sat EOB with supervision and mod verbal cues for sequencing.  Pt stated that she needed to use the bathroom.  When pt requested to walk with RW to bathroom, pt stated that she had "already used the bathroom." Pt incontinent of bladder.  PT required max verbal cues for sequencing and mod A for standing when changing her pants and performing perineal hygiene.  Pt required max encouragement to initiate and complete tasks. Pt persistently attempted to lay back onto bed before completing tasks.  Pt [facility] max [facility] but performed sit>supine with min a to bring RLE onto bed.  Pt remained in bed with all needs within reach and bed alarm activated.   Lavone NeriLanier, Kelsey Smith [facility]ell 10/01/2016, 2:31 PM

## 2016-10-01 NOTE — Progress Notes (Signed)
Occupational Therapy Session Note  Patient Details  Name: [name] L Dames MRN: [id number] Date of Birth: 1961/04/07  Today's Date: 10/01/2016 OT Individual Time: 5621-30861401-1451 OT Individual Time Calculation (min): 50 min    Short Term Goals: Week 1:  OT Short Term Goal 1 (Week 1): Pt will perform 3/3 toileting with mod A for balance  OT Short Term Goal 2 (Week 1): Pt will don Lb clothing with min A sit to stand OT Short Term Goal 3 (Week 1): Pt will attend to functional task for 10 min with minimal cuing  OT Short Term Goal 4 (Week 1): Pt will demonstrate emergent awareness in functional context with min questioning cues     Skilled Therapeutic Interventions/Progress Updates:   Skilled OT session completed with focus on alertness, R NMR, and attention. With encouragement, pt willing to get OOB and participate in tx. Supine<sit completed with supervision. EOB>w/c via squat pivot completed with Min A and instruction on hand placement. She declined to continue with self propulsion due to fatigue/weakness. Pt was propelled to [employer] gym and participated in seated checkers activity. Utilized R UE crossing midline, manipulating small items, and reaching outside of base of support with cues to attend. Sustained attention to task throughout game (>10 minutes), mod cues for following instructions consistently. Afterwards she completed handwriting/drawing task with R UE in dayroom for R UE NMR and attention. Drink set up provided with RN consent, pt requiring Min A with task as well as cues for taking slow sips. Pt then requested to return to room. OT propelled pt back, squat pivot transfer to bed completed in manner as written above. Pt repositioned for comfort and left with all needs within reach. Bed alarm activated.   Therapy Documentation Precautions:  Precautions Precautions: Fall Restrictions Weight Bearing Restrictions: No General:   Vital Signs: Therapy Vitals Temp: 98.2 F (36.8 C) Temp  Source: Oral Pulse Rate: 79 Resp: 17 BP: 103/65 Patient Position (if appropriate): Lying Oxygen Therapy SpO2: 100 % O2 Device: Not Delivered Pain: C/o pain that reportedly resolved with drinking fluids    ADL: ADL ADL Comments: see functional navigator    See Function Navigator for Current Functional Status.   Therapy/Group: Individual Therapy  Tyshae Stair A Emiah Pellicano 10/01/2016, 3:52 PM

## 2016-10-02 ENCOUNTER — Inpatient Hospital Stay (HOSPITAL_COMMUNITY): Payer: Self-pay

## 2016-10-02 ENCOUNTER — Inpatient Hospital Stay (HOSPITAL_COMMUNITY): Payer: Medicaid Other | Admitting: Physical Therapy

## 2016-10-02 ENCOUNTER — Inpatient Hospital Stay (HOSPITAL_COMMUNITY): Payer: Self-pay | Admitting: Speech Pathology

## 2016-10-02 NOTE — Progress Notes (Signed)
Physical Therapy Session Note  Patient Details  Name: Kelsey Smith MRN: [id number] Date of Birth: 1961/05/22  Today's Date: 10/02/2016 PT Individual Time: 0800-0835 PT Individual Time Calculation (min): 35 min    Short Term Goals: Week 1:  PT Short Term Goal 1 (Week 1): Pt will perform functional transfers with min A PT Short Term Goal 2 (Week 1): Pt will perform gait with max A x 20' in controlled enviornment  Skilled Therapeutic Interventions/Progress Updates:   Pt presented in bed agreeable to therapy with encouragement. Pt c/o of HA nsg notified and meds received during session. Pt performed supine to sit with use of features with minA.  Sat at EOB where pt was able to perform sit to stand with RW with min guard while PTA don/doff brief. Pt able to thread pants mod I while sitting and performed squat pivot transfer to w/c from bed with min guard with mod cues for safety. Pt propelled 165f with w/c for endurance with mod cues for turns and increased use of RUE. Pt return to room and request return to bed until next session. Squat pivot performed to return to bed in same manner as prior and pt left in bed with call bell within reach and all needs met.   Therapy Documentation Precautions:  Precautions Precautions: Fall Restrictions Weight Bearing Restrictions: No General:     See Function Navigator for Current Functional Status.   Therapy/Group: Individual Therapy  Jacklyn Branan  Alessandro Griep, PTA  10/02/2016, 9:18 AM

## 2016-10-02 NOTE — Progress Notes (Signed)
Occupational Therapy Session Note  Patient Details  Name: Kelsey Smith MRN: [id number] Date of Birth: Jul 29, 1961  Today's Date: 10/02/2016 OT Individual Time: 1100-1148 OT Individual Time Calculation (min): 48 min     Short Term Goals: Week 1:  OT Short Term Goal 1 (Week 1): Pt will perform 3/3 toileting with mod A for balance  OT Short Term Goal 2 (Week 1): Pt will don Lb clothing with min A sit to stand OT Short Term Goal 3 (Week 1): Pt will attend to functional task for 10 min with minimal cuing  OT Short Term Goal 4 (Week 1): Pt will demonstrate emergent awareness in functional context with min questioning cues  Skilled Therapeutic Interventions/Progress Updates:    Pt resting in bed upon arrival sorting through variety of cosmetics brought in by friends.  Pt agreeable to shower but asked if she could lay back into bed when complete.  Pt performed all squat pivot transfers with min A.  Pt required min a for sit<>stand in shower and at sink.  Pt was able to use her RUE to attempt bathing her buttocks in shower but would not attempt to pull up pants. Pt exhibited increased fatigue as the session progressed and pt verbalized "I can't do this" numerous times.  Pt's attention to task waned as she continued to fatigue during the session.  Focus on activity tolerance, functional transfers, standing balance, RUE use, safety awareness, task initiation, sequencing, attention to task, and active participation to increase independence with BADLs.  Therapy Documentation Precautions:  Precautions Precautions: Fall Restrictions Weight Bearing Restrictions: No General: General OT Amount of Missed Time: 12 Minutes   Pain: Pain Assessment Pain Assessment: 0-10 Pain Score: 2  Pain Type: Acute pain Pain Location: Head Pain Orientation: Right Pain Descriptors / Indicators: Aching Pain Frequency: Occasional Pain Onset: Gradual Patients Stated Pain Goal: 2 Pain Intervention(s): RN  aware   See Function Navigator for Current Functional Status.   Therapy/Group: Individual Therapy  Rich BraveLanier, Rosealynn Mateus Chappell 10/02/2016, 12:11 PM

## 2016-10-02 NOTE — Progress Notes (Signed)
Speech Language Pathology Daily Session Note  Patient Details  Name: Kelsey Smith MRN: [id number] Date of Birth: 10-Apr-1961  Today's Date: 10/02/2016 SLP Individual Time: 1300-1330 SLP Individual Time Calculation (min): 30 min   Short Term Goals: Week 1: SLP Short Term Goal 1 (Week 1): Patient will demonstrate sustained attention to task for 5 minutes with Max A verbal cues for redirection.  SLP Short Term Goal 2 (Week 1): Patient will identify 2 cognitive and 2 physical deficits with Mod A question cues.  SLP Short Term Goal 3 (Week 1): Patient will demonstrate functional problem solving with Min A verbal cues.  SLP Short Term Goal 4 (Week 1): Patient will utilize schedule to anticipate upcoming therapy appointments, etc with Min A question cues.   Skilled Therapeutic Interventions: Skilled treatment session focused on cognitive goals. Upon arrival, patient was awake while upright in bed with make-up on. Patient appeared more alert and reported, "I'm trying." Patient also reported she was incontinent of urine and that her brief needed to be changed. SLP facilitated session by initially providing Min A verbal cues for problem solving faded to Mod I for organization of a 2 time per day pill box. Pill box will be completed at next session. Patient left supine in bed with all needs within reach. Continue with current plan of care.   Function:  Cognition Comprehension Comprehension assist level: Understands basic 90% of the time/cues < 10% of the time  Expression   Expression assist level: Expresses complex 90% of the time/cues < 10% of the time  Social Interaction Social Interaction assist level: Interacts appropriately 75 - 89% of the time - Needs redirection for appropriate language or to initiate interaction.  Problem Solving Problem solving assist level: Solves basic 75 - 89% of the time/requires cueing 10 - 24% of the time  Memory Memory assist level: Recognizes or recalls 75 - 89% of  the time/requires cueing 10 - 24% of the time    Pain No/Denies Pain   Therapy/Group: Individual Therapy  Amine Adelson 10/02/2016, 2:14 PM

## 2016-10-02 NOTE — Consult Note (Signed)
NEUROBEHAVIORAL STATUS EXAM - [facility]   MEDICAL NECESSITY:  Kelsey Smith [facility] for a neurobehavioral status exam owing to the patient's diagnosis of TBI, and to assist in treatment planning during admission.   Records indicate that Kelsey Smith is a "56 y.o. female who was admitted on 09/23/16 with complaints of HA, right sided weakness and difficulty speaking.  Patient had been drinking heavily the night before, sustained a fall and was helped back in bed but found on the floor next morning not acting normally. UDS positive for cocaine. 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 She was started on CIWA protocol and bleed felt to be due to HTN in setting of heavy alcohol use. MRI/MRA brain done revealing left thalamic hemorrhage with regional mass effect/edema, mild to moderate age advanced white matter disease, MRA negative for left thalamic hemorrhage and 5 mm L-ICA aneurysm.  2 D echo done today--results pending.  WOC consulted for input on old full thickness right thigh injury and recommended Aquacel to absorb drainage.  Patient with resultant HA, aphasia, poor attention, difficulty walking due to right sided weakness. CIR recommended for follow up therapy."   During today's visit, Kelsey Smith denied experiencing any cognitive issues, though some have purportedly noticed fluctuating issues with disorientation to time and memory loss. She also suffers right-sided upper and lower extremity motor weakness. Of note, Kelsey Smith purportedly recalls the head trauma that she sustained. She does not think that she was knocked unconscious. She was drinking the night of the incident but she said that she only had one half of a drink.  She said that she awoke the next morning but had trouble becoming fully aroused. She also felt nauseas.   Emotionally, [name] described feelings of depression and anxiety secondary to her present medical circumstances. She denied any history of mental illness or prolonged periods of depression or anxiety throughout her life. She was recently prescribed an antidepressant but she could not elaborate on whether she finds it to be helpful. Suicidal/homicidal ideation, plan or intent was denied. No manic or hypomanic episodes were reported. The patient denied ever experiencing any auditory/visual hallucinations. No major behavioral or personality changes were endorsed.   Kelsey Smith feels that she is making progress in therapy. No barriers identified. She described the rehab staff as "wonderful." She presently resides with three female roommates who can assist with her transition home and provide ample social support.   PROCEDURES: [2 units 96116]  MENTAL STATUS EXAM: APPEARANCE:  Normal/appropriate GEN:  Somewhat inattentive and tended to lose train of thought MOOD:  mildly depressed and anxious       AFFECT:  Mood congruent SPEECH:  Slow but fluent  THOUGHT CONTENT:  Appropriate HALLUCINATIONS:  None INTELLIGENCE:  Average  INSIGHT:  Fair JUDGMENT:  Fair SUICIDAL IDEATION:  Denies SI   HOMICIDAL IDEATION:   Denies HI   IMPRESSION: Overall, Kelsey Smith denied experiencing any cognitive difficulties secondary to her present medical circumstances. However, she was noted to be inattentive at times and she tended to lose her train of thought; insight also appeared somewhat limited. As such, I recommend outpatient neuropsychological evaluation. Emotionally, she described depressive and anxious symptoms that are being managed via an antidepressant. I also discussed participation in counseling and support groups. She did not appear that interested in therapy, though I will ask her social worker to provide  resources for these upon discharged. The patient was encouraged to call upon neuropsychology as warranted throughout the  remainder of her admission.   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

## 2016-10-02 NOTE — Progress Notes (Addendum)
Physical Therapy Session Note  Patient Details  Name: [name] L Tye MRN: [id number] Date of Birth: 1961-03-02  Today's Date: 10/02/2016 PT Individual Time: [id number] and 1401-1431 PT Individual Time Calculation (min): 46 min and 30 min   Short Term Goals: Week 1:  PT Short Term Goal 1 (Week 1): Pt will perform functional transfers with min A PT Short Term Goal 2 (Week 1): Pt will perform gait with max A x 20' in controlled enviornment  Skilled Therapeutic Interventions/Progress Updates:    Treatment 1: Pt received in bed & agreeable to tx. Pt noted R shoulder & arm pain (6/10) but reports being premedicated. Session focused on transfers, gait, endurance training, coordination, and RUE/LE neuro re-ed. Pt able to complete squat pivot transfer bed>w/c with min assist for weight shifting & placement. Pt propelled w/c x 15 ft + 15 ft with BUE & min assist with max cuing for hand placement & technique. Pt continues to demonstrate RUE neglect. Gait training x 10 ft + 20 ft + 25 ft with RW & min/mod assist with green theraband on RW for visual cue. Pt with improving ability to advance RLE with cuing for dorsiflexion but continues to demonstrate weak RLE, decreased hip/knee flexion during swing phase, and very minimal heel strike. Pt utilize nu-step level 1 x 10 minutes with rest breaks every 2 minutes with task focusing on coordination of reciprocal movements & RUE/LE neuro re-ed.  Educated pt on pacing herself for energy conservation & required cuing to prevent RLE abduction. Pt with poor safety awareness & insight as she asked about d/c date & stated "I don't know" when asked if she felt safe to d/c at this time. Educated pt on estimated length of stay. At end of session pt assisted back to bed & left with all needs within reach & bed alarm set. Encouraged pt to participate in all therapies on this date.   Treatment 2: Pt received in bed & agreeable to tx, noting back pain but "not too bad, a little  bit". Provided pt with paper scrub pants & pt able to don them with min/mod assist for standing balance. Pt completed squat pivot transfer bed>w/c with min assist but poor safety awareness overall. Pt assembled pipe tree shapes (3 most simple) with max cuing for problem solving. Activity also focused on functional use of RUE. At end of session pt left in recliner with BLE elevated & all needs within reach. Educated pt on need to call for assistance when wanting to transfer out of recliner.   Therapy Documentation Precautions:  Precautions Precautions: Fall Restrictions Weight Bearing Restrictions: No   See Function Navigator for Current Functional Status.   Therapy/Group: Individual Therapy  Sandi MariscalVictoria M Tita Terhaar 10/02/2016, 2:55 PM

## 2016-10-02 NOTE — Patient Care Conference (Signed)
Inpatient RehabilitationTeam Conference and Plan of Care Update Date: 10/02/2016   Time: 2:50  PM    Patient Name: Kelsey Smith      Medical Record Number: [id number]  Date of Birth: 03/19/1961 Sex: Female         Room/Bed: 4W17C/4W17C-01 Payor Info: Payor: MED PAY / Plan: MED PAY ASSURANCE / Product Type: *No Product type* /    Admitting Diagnosis: traumatic IPH  Admit Date/Time:  09/26/2016  6:32 PM Admission Comments: No comment available   Primary Diagnosis:  <principal problem not specified> Principal Problem: <principal problem not specified>  Patient Active Problem List   Diagnosis Date Noted  . IVH (intraventricular hemorrhage) (HCC) 09/26/2016  . B12 deficiency 09/26/2016  . Hypertensive emergency 09/26/2016  . Essential hypertension 09/26/2016  . Carotid aneurysm, left (HCC) 09/26/2016  . Open thigh wound 09/26/2016  . Somnolence   . Recurrent major depressive disorder (HCC)   . ETOH abuse   . Tobacco abuse   . Cocaine abuse   . Pure hypercholesterolemia   . 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 Faith Rogue Social Worker Present: Amada Jupiter, LCSW Nurse Present: Carmie End, RN PT Present: Aleda Grana, PT OT Present: Roney Mans, OT;Ardis Rowan, COTA SLP Present: Feliberto Gottron, SLP PPS Coordinator present : Tora Duck, RN, CRRN     Current Status/Progress Goal Weekly Team Focus  Medical   Left thalamic ICH with left hemiparesis and cogniitve/behavioral deficits  improve activity tolerance and mobility  mood/lethargy/engagement, rx of vague pain/ nutrition   Bowel/Bladder   Incontinent and Continent  Toileting q2h with no accidents.  Retrain bowel/bladder throughout the day. Free of accidents Toileting q2hrs.   Swallow/Nutrition/ Hydration             ADL's   decreased activity tolerance/max  verbal encouragement to participate, mod/max A for LB dressing tasks, min/mod A for bathing,   supervision overall  activity tolerance, RUE NMR, standing balance, funcitonal transfers, functional amb for home mgmt tasks, safety awareness   Mobility   Mod assist with RW/WC  Strength to be more independent.  Continue mod assistancce with RW.   Communication             Safety/Cognition/ Behavioral Observations  Min-Mod A  Supervision  problem solving, attention, recall and awareness    Pain   Headache pain 6-7/10  Pain less than 3. Using Tylenol used prn.  Effective PRN meds used.   Skin   Wound to the Right Knee.  Keep wound free of infection.  Continue Daily dsg.    Rehab Goals Patient on target to meet rehab goals: Yes *See Care Plan and progress notes for long and short-term goals.  Barriers to Discharge: cognitive behavioral deficits, decreased awareness    Possible Resolutions to Barriers:  family education, continued Cognitive-perceptual rx    Discharge Planning/Teaching Needs:  Plan home with roomates to provide 24/7 supervision, however, this SW has not been able to reach any of them to confirm plan at time of conf.  Will need to schedule education with caregivers.   Team Discussion:  Poor po intake but megace started and better today.  C/o HA.  Slight improvement to overall insight.  Better day today with participation and engaging in more activity.  Currently mod assist to amb 20' and poor motor planning.  Supervision goals.  MD agrees with SW  of need to initiate SSD/ MA application.  Revisions to Treatment Plan:  None   Continued Need for Acute Rehabilitation Level of Care: The patient requires daily medical management by a physician with specialized training in physical medicine and rehabilitation for the following conditions: Daily direction of a multidisciplinary physical rehabilitation program to ensure safe treatment while eliciting the highest outcome that is of  practical value to the patient.: Yes Daily medical management of patient stability for increased activity during participation in an intensive rehabilitation regime.: Yes Daily analysis of laboratory values and/or radiology reports with any subsequent need for medication adjustment of medical intervention for : Neurological problems;Mood/behavior problems  Seleny Allbright 10/02/2016, 4:13 PM

## 2016-10-02 NOTE — Progress Notes (Signed)
Nutrition Follow-up  DOCUMENTATION CODES:   Not applicable  INTERVENTION:  Continue Ensure Enlive po TID, each supplement provides 350 kcal and 20 grams of protein.  Encourage adequate PO intake.   NUTRITION DIAGNOSIS:   Increased nutrient needs related to  (therapy) as evidenced by estimated needs; ongoing  GOAL:   Patient will meet greater than or equal to 90% of their needs; met  MONITOR:   PO intake, Supplement acceptance, Labs, Weight trends, Skin, I & O's  REASON FOR ASSESSMENT:   Consult Assessment of nutrition requirement/status  ASSESSMENT:   56 y.o. female who was admitted on 09/23/16 with complaints of  HA, right sided weakness and difficulty speaking.  Patient had been drinking heavily the night before, sustained a fall and was helped back in bed but found on the floor next morning not acting normally. UDS positive for cocaine. 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 She was started on CIWA protocol and bleed felt to be due to HTN in setting of heavy alcohol use. MRI/MRA brain done revealing left thalamic hemorrhage with regional mass effect/edema, mild to moderate age advanced white matter disease, MRA negative for left thalamic hemorrhage and 5 mm L-ICA aneurysm.  Pt reports appetite is just "ok". Meal completion has been varied from 10-50% with 50% po intake this AM. Pt does reports some abdominal discomfort post prandial. Pt reports eating well PTA with usual consumption of at least 3 meals a day. Usual body weight reported to be ~118 lbs. Pt currently has Ensure ordered and has been consuming them. RD to continue with current orders to aid in adequate caloric and protein needs.   Nutrition-Focused physical exam completed. Findings are no fat depletion, mild to moderate muscle depletion, and no edema.   Labs and medications reviewed.   Diet Order:  Diet Heart Room service appropriate? Yes; Fluid  consistency: Thin  Skin:   (Non-pressure wound on R knee)  Last BM:  1/7  Height:   Ht Readings from Last 1 Encounters:  09/23/16 _0  (1.549 m)    Weight:   Wt Readings from Last 1 Encounters:  09/30/16 115 lb 15.4 oz (52.6 kg)    Ideal Body Weight:  47.7 kg  BMI:  Body mass index is 21.91 kg/m.  Estimated Nutritional Needs:   Kcal:  1500-1800  Protein:  60-70 grams  Fluid:  >/= 1.5 L/day  EDUCATION NEEDS:   No education needs identified at this time  Corrin Parker, MS, RD, LDN Pager # [phone] After hours/ weekend pager # [phone]

## 2016-10-02 NOTE — Progress Notes (Signed)
[facility]     PROGRESS NOTE    Subjective/Complaints: Sitting at [employer] preparing her breakfast to eat. "Can I get a shower today?". Has dull headaache--"medication helps"  ROS: pt denies nausea, vomiting, diarrhea, cough, shortness of breath or chest pain    Objective: Vital Signs: Blood pressure 109/67, pulse 76, temperature 98.5 F (36.9 C), temperature source Oral, resp. rate 16, weight 52.6 kg (115 lb 15.4 oz), SpO2 100 %. Dg Abd Portable 1v  Result Date: 09/30/2016 CLINICAL DATA:  Nausea for 2 days EXAM: PORTABLE ABDOMEN - 1 VIEW COMPARISON:  None. FINDINGS: Scattered large and small bowel gas are seen. Fecal material is noted throughout the colon. No obstructive changes are seen. No free air is noted. No acute bony abnormality is seen. IMPRESSION: No acute abnormality noted. Electronically Signed   By: Alcide CleverMark  Lukens M.D.   On: 09/30/2016 13:36    Recent Labs  10/01/16 0811  WBC 9.0  HGB 13.5  HCT 41.5  PLT 454*    Recent Labs  10/01/16 0811  NA 136  K 4.3  CL 98*  GLUCOSE 167*  BUN 16  CREATININE 0.87  CALCIUM 9.8   CBG (last 3)  No results for input(s): GLUCAP in the last 72 hours.  Wt Readings from Last 3 Encounters:  09/30/16 52.6 kg (115 lb 15.4 oz)  09/24/16 47.1 kg (103 lb 13.4 oz)    Physical Exam:  Constitutional: She is oriented to person, place, and time. She appears well-developed.  HENT:  Head: Normocephalic and atraumatic.  Eyes: PERRL.  Neck: Normal range of motion. Neck supple.  Cardiovascular: RRR   Respiratory: clear bilaterally. No wheezes  GI: Soft. Bowel sounds are normal. She exhibits no distension. There is no tenderness.  Musculoskeletal: She exhibits no edema or tenderness Neurological: She is oriented to person, place, and time and easily aroused this morning.  Marland Kitchen.  RUE with persistent ataxia.  Limited engagement/ attention.  Lacks insight into deficits.  Left central 7 Motor: R UE 4/5  proximal to distal, LUE 5/5 R LE 4/5 proximal to distal. LLE 4+ to 5/5. Ataxia RUE Sensation intact to light touch DTRs symmetric  Skin: Skin is warm and dry.  Right shin /media left knee wounds  healing Psychiatric: Her affect is blunt.    Assessment/Plan: 1. Functional and mobility deficits secondary to left thalamic ICH which require 3+ hours per day of interdisciplinary therapy in a comprehensive inpatient rehab setting. Physiatrist is providing close team supervision and 24 hour management of active medical problems listed below. Physiatrist and rehab team continue to assess barriers to discharge/monitor patient progress toward functional and medical goals.  Function:  Bathing Bathing position   Position: Shower  Bathing parts Body parts bathed by patient: Right arm, Chest, Abdomen, Front perineal area, Buttocks, Right upper leg, Left upper leg, Left lower leg, Back Body parts bathed by helper: Right lower leg, Left arm  Bathing assist Assist Level: Touching or steadying assistance(Pt > 75%)      Upper Body Dressing/Undressing Upper body dressing   What is the patient wearing?: Hospital gown                Upper body assist Assist Level: Touching or steadying assistance(Pt > 75%)      Lower Body Dressing/Undressing Lower body dressing   What is the patient wearing?: Non-skid slipper socks, Hospital Gown           Non-skid slipper socks- Performed by helper:  Don/doff right sock, Don/doff left sock                  Lower body assist Assist for lower body dressing: Touching or steadying assistance (Pt > 75%)      Toileting Toileting   Toileting steps completed by patient:  (No clothing) Toileting steps completed by helper: Performs perineal hygiene Toileting Assistive Devices: Grab bar or rail  Toileting assist Assist level: Touching or steadying assistance (Pt.75%)   Transfers Chair/bed transfer   Chair/bed transfer method: Squat pivot Chair/bed  transfer assist level: Moderate assist (Pt 50 - 74%/lift or lower) Chair/bed transfer assistive device: Armrests, Bedrails     Locomotion Ambulation     Max distance: 20 Assist level: Moderate assist (Pt 50 - 74%)   Wheelchair   Type: Manual Max wheelchair distance: 100 ft Assist Level: Touching or steadying assistance (Pt > 75%)  Cognition Comprehension Comprehension assist level: Understands basic 90% of the time/cues < 10% of the time  Expression Expression assist level: Expresses complex 90% of the time/cues < 10% of the time  Social Interaction Social Interaction assist level: Interacts appropriately 90% of the time - Needs monitoring or encouragement for participation or interaction.  Problem Solving Problem solving assist level: Solves complex 90% of the time/cues < 10% of the time  Memory Memory assist level: Recognizes or recalls 90% of the time/requires cueing < 10% of the time   Medical Problem List and Plan: 1.  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  -continue CIR PT, OT, SLP therapies 2.  DVT Prophylaxis/Anticoagulation: Pharmaceutical: Lovenox 3. Pain Management: tylenol prn.  4. Mood: LCSW to follow for evaluation and support.   -neuropsych eval while here.  -on buspar and paxil currently 5. Neuropsych: This patient is not fully capable of making decisions on her own behalf.  -appreciate neuropsych eval 6. Right leg wound/Skin/Wound Care: Aquacel to absorb drainage. Wounds healing appropriately  7. Fluids/Electrolytes/Nutrition: encourage PO  -appreciate dietary consult  -supplementing potassium- BMP Latest Ref Rng & Units 10/01/2016 09/27/2016 09/26/2016  Glucose 65 - 99 mg/dL 696(E) 952(W) 413(K)  BUN 6 - 20 mg/dL 16 9 10   Creatinine 0.44 - 1.00 mg/dL 4.40 1.02 7.25  Sodium 135 - 145 mmol/L 136 135 140  Potassium 3.5 - 5.1 mmol/L 4.3 3.3(L) 3.7  Chloride 101 - 111 mmol/L 98(L) 101 107  CO2  22 - 32 mmol/L 28 24 25   Calcium 8.9 - 10.3 mg/dL 9.8 9.9 9.3    -continue megace trial 8. HTN: On lisinopril.   -NTG paste prn for elevated BS.  -fair control with periodic elevations 9. Polysubstance abuse: has completed CIWA protocol. Counsel. 10 Somnolence: Likely due to ICH. Monitor for now.   -appears more alert. Still fatigues quite easily though  -sleep fair to good 11.  Nausea- seems better after BM  -continue to follow       LOS (Days) 6 A FACE TO FACE EVALUATION WAS PERFORMED  Ranelle Oyster, MD 10/02/2016 9:01 AM

## 2016-10-03 ENCOUNTER — Inpatient Hospital Stay (HOSPITAL_COMMUNITY): Payer: Self-pay

## 2016-10-03 ENCOUNTER — Inpatient Hospital Stay (HOSPITAL_COMMUNITY): Payer: Medicaid Other | Admitting: Speech Pathology

## 2016-10-03 ENCOUNTER — Inpatient Hospital Stay (HOSPITAL_COMMUNITY): Payer: Self-pay | Admitting: Physical Therapy

## 2016-10-03 ENCOUNTER — Inpatient Hospital Stay (HOSPITAL_COMMUNITY): Payer: Medicaid Other | Admitting: Physical Therapy

## 2016-10-03 NOTE — Progress Notes (Signed)
Pt will not take independence in changing her self when incontinent. She stated she can't. Will continue to encourage.

## 2016-10-03 NOTE — Progress Notes (Addendum)
[place] PHYSICAL MEDICINE & REHABILITATION     PROGRESS NOTE    Subjective/Complaints: No new issues overnight. Appetite seems to be increasing. Still has headache  ROS: pt denies nausea, vomiting, diarrhea, cough, shortness of breath or chest pain    Objective: Vital Signs: Blood pressure 104/69, pulse 79, temperature 98 F (36.7 C), temperature source Oral, resp. rate 18, weight 52.6 kg (115 lb 15.4 oz), SpO2 100 %. No results found.  Recent Labs  10/01/16 0811  WBC 9.0  HGB 13.5  HCT 41.5  PLT 454*    Recent Labs  10/01/16 0811  NA 136  K 4.3  CL 98*  GLUCOSE 167*  BUN 16  CREATININE 0.87  CALCIUM 9.8   CBG (last 3)  No results for input(s): GLUCAP in the last 72 hours.  Wt Readings from Last 3 Encounters:  09/30/16 52.6 kg (115 lb 15.4 oz)  09/24/16 47.1 kg (103 lb 13.4 oz)    Physical Exam:  Constitutional: She is oriented to person, place, and time. She appears well-developed.  HENT:  Head: Normocephalic and atraumatic.  Eyes: PERRL.  Neck: Normal range of motion. Neck supple.  Cardiovascular:RRR   Respiratory: clear bilaterally. No wheezes  GI: Soft. Bowel sounds are normal. She exhibits no distension. There is no tenderness.  Musculoskeletal: She exhibits no edema or tenderness Neurological: She is oriented to person, place, and time and easily aroused this morning.  Marland Kitchen  RUE with persistent ataxia.  Limited engagement/ attention.  Has intact basic insight.  Left central 7 Motor: R UE 4/5 proximal to distal, LUE 5/5 R LE 4/5 proximal to distal. LLE 4+ to 5/5. Ataxia RUE Sensation intact to light touch DTRs symmetric  Skin: Skin is warm and dry.  Right shin /media left knee wounds  healing Psychiatric: Her affect is blunt.    Assessment/Plan: 1. Functional and mobility deficits secondary to left thalamic ICH which require 3+ hours per day of interdisciplinary therapy in a comprehensive inpatient rehab setting. Physiatrist is providing  close team supervision and 24 hour management of active medical problems listed below. Physiatrist and rehab team continue to assess barriers to discharge/monitor patient progress toward functional and medical goals.  Function:  Bathing Bathing position   Position: Shower  Bathing parts Body parts bathed by patient: Right arm, Chest, Abdomen, Front perineal area, Right upper leg, Left upper leg, Left arm, Left lower leg, Right lower leg Body parts bathed by helper: Buttocks, Back  Bathing assist Assist Level: Touching or steadying assistance(Pt > 75%)      Upper Body Dressing/Undressing Upper body dressing   What is the patient wearing?: Pull over shirt/dress     Pull over shirt/dress - Perfomed by patient: Thread/unthread right sleeve, Thread/unthread left sleeve, Put head through opening, Pull shirt over trunk          Upper body assist Assist Level: Supervision or verbal cues      Lower Body Dressing/Undressing Lower body dressing   What is the patient wearing?: Underwear, Pants, Non-skid slipper socks Underwear - Performed by patient: Thread/unthread right underwear leg, Thread/unthread left underwear leg Underwear - Performed by helper: Pull underwear up/down Pants- Performed by patient: Thread/unthread right pants leg, Thread/unthread left pants leg Pants- Performed by helper: Pull pants up/down Non-skid slipper socks- Performed by patient: Don/doff right sock, Don/doff left sock Non-skid slipper socks- Performed by helper: Don/doff right sock, Don/doff left sock  Lower body assist Assist for lower body dressing: Touching or steadying assistance (Pt > 75%)      Toileting Toileting   Toileting steps completed by patient:  (No clothing) Toileting steps completed by helper: Performs perineal hygiene Toileting Assistive Devices: Grab bar or rail  Toileting assist Assist level: Touching or steadying assistance (Pt.75%)   Transfers Chair/bed  transfer   Chair/bed transfer method: Squat pivot Chair/bed transfer assist level: Touching or steadying assistance (Pt > 75%) Chair/bed transfer assistive device: Armrests     Locomotion Ambulation     Max distance: 25 ft Assist level: Touching or steadying assistance (Pt > 75%)   Wheelchair   Type: Manual Max wheelchair distance: 100 ft Assist Level: Moderate assistance (Pt 50 - 74%)  Cognition Comprehension Comprehension assist level: Understands basic 90% of the time/cues < 10% of the time  Expression Expression assist level: Expresses complex 90% of the time/cues < 10% of the time  Social Interaction Social Interaction assist level: Interacts appropriately 90% of the time - Needs monitoring or encouragement for participation or interaction.  Problem Solving Problem solving assist level: Solves complex 90% of the time/cues < 10% of the time  Memory Memory assist level: Recognizes or recalls 90% of the time/requires cueing < 10% of the time   Medical Problem List and Plan: 1.  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  -continue CIR PT, OT, SLP therapies  -showing some signs of improved awareness 2.  DVT Prophylaxis/Anticoagulation: Pharmaceutical: Lovenox 3. Pain Management: tylenol prn.  4. Mood: LCSW to follow for evaluation and support.   -neuropsych eval appreciated  -on buspar and paxil currently 5. Neuropsych: This patient is not fully capable of making decisions on her own behalf.    6. Right leg wound/Skin/Wound Care: wounds healing nicely  7. Fluids/Electrolytes/Nutrition: encourage PO  -appreciate dietary consult  -supplementing potassium- BMP Latest Ref Rng & Units 10/01/2016 09/27/2016 09/26/2016  Glucose 65 - 99 mg/dL 782(N167(H) 562(Z120(H) 308(M113(H)  BUN 6 - 20 mg/dL 16 9 10   Creatinine 0.44 - 1.00 mg/dL 5.780.87 4.690.50 6.290.72  Sodium 135 - 145 mmol/L 136 135 140  Potassium 3.5 - 5.1 mmol/L 4.3 3.3(L) 3.7   Chloride 101 - 111 mmol/L 98(L) 101 107  CO2 22 - 32 mmol/L 28 24 25   Calcium 8.9 - 10.3 mg/dL 9.8 9.9 9.3    -megace seems to have helped appetite ate 50-100% yesterday 8. HTN: On lisinopril.   -NTG paste prn for elevated BS.  -fair control with periodic elevations 9. Polysubstance abuse: has completed CIWA protocol. Counsel. 10 Somnolence: Likely due to ICH. Monitor for now.   -appears more alert. Still fatigues quite easily though  -sleep fair to good 11.  Nausea- seems better after BM  -continue to follow       LOS (Days) 7 A FACE TO FACE EVALUATION WAS PERFORMED  Ranelle OysterSWARTZ,Radley Teston T, MD 10/03/2016 12:46 PM

## 2016-10-03 NOTE — Progress Notes (Signed)
Physical Therapy Session Note  Patient Details  Name: Kelsey Smith MRN: [id number] Date of Birth: [birth year]  Today's Date: 10/03/2016 PT Individual Time: 7628-3151 PT Individual Time Calculation (min): 27 min    Short Term Goals: Week 1:  PT Short Term Goal 1 (Week 1): Pt will perform functional transfers with min A PT Short Term Goal 2 (Week 1): Pt will perform gait with max A x 20' in controlled enviornment  Skilled Therapeutic Interventions/Progress Updates:      Pt received supine in bed and agreeable to PT. Supine>sit transfer with supervision assist and min cues   Sink level bathing to clean up after incontinent episode min assist from PT for sit<>stand to doff/don brief and mod cues for proper UE placement. PT required to perform posterior perineal hygiene. Patient utilized BUE for upper body cleaning.   Gait x 6f with RW Min assist from PT and mod cues for improved foot clearance and heel strike. Patient able to carry over 50% of the time.  Brushing teeth with RUE sitting in WC. PT required to provide set up only and noted to use RUE for brush management.   Left sitting in WC with QRB in place and all needs met.    Therapy Documentation Precautions:  Precautions Precautions: Fall Restrictions Weight Bearing Restrictions: No   Vital Signs: Therapy Vitals Temp: 98 F (36.7 C) Temp Source: Oral Pulse Rate: 79 Resp: 18 BP: 104/69 Patient Position (if appropriate): Lying Oxygen Therapy SpO2: 100 % O2 Device: Not Delivered Pain: 0/10  See Function Navigator for Current Functional Status.   Therapy/Group: Individual Therapy  ALorie Phenix1/06/2017, 8:33 AM

## 2016-10-03 NOTE — Progress Notes (Signed)
Physical Therapy Session Note  Patient Details  Name: Kelsey Smith MRN: [id number] Date of Birth: 08-12-1961  Today's Date: 10/03/2016 PT Individual Time: [id number] PT Individual Time Calculation (min): 55 min    Short Term Goals: Week 1:  PT Short Term Goal 1 (Week 1): Pt will perform functional transfers with min A PT Short Term Goal 2 (Week 1): Pt will perform gait with max A x 20' in controlled enviornment  Skilled Therapeutic Interventions/Progress Updates:    Pt received in bed & agreeable to tx. Pt noted 5/10 pain in head & back & RN made aware; meds administered during session. Upon PT arrival pt reported "I'm wet" and therapist educated pt on importance of calling for nursing assistance when needing to use restroom or be cleaned up. Educated pt on use of call bell as when pt attempted she stated "I don't know how". Pt completed sit<>stand transfers with min assist & stood with min assist for balance while performing peri-hygiene and donning clothing. Pt required max verbal cuing to cross ankle to opposite knee to thread pants and total assist for donning brief. Pt propelled w/c room>gym with BUE for forced use of RUE & neuro re-ed. Applied theraband to R wheel for tactile feedback during task. Pt required max cuing for technique & supervision<>mod assist for w/c propulsion as pt with difficulty negotiating doorways. Pt utilizing cybex kinetron in sitting for RLE neuro re-ed when she reported "I'm peeing" and noted no awareness prior to incontinence; RN made aware. Assisted pt back to room where she performed peri hygiene in same manner. When retrieving a new brief for pt she transferred herself w/c>bed without assistance; educated pt on importance of notifying PT and having help before transferring out of w/c. Pt utilized dynavision in standing with min assist for balance & task focusing on RUE neuro re-ed and attention to R side. Pt required max cuing to remain standing for entire 60  seconds of task x 2 trials. At end of session pt left in bed with all needs within reach & alarm set.   Therapy Documentation Precautions:  Precautions Precautions: Fall Restrictions Weight Bearing Restrictions: No  See Function Navigator for Current Functional Status.   Therapy/Group: Individual Therapy  Sandi MariscalVictoria M Havish Petties 10/03/2016, 12:40 PM

## 2016-10-03 NOTE — Progress Notes (Signed)
Recreational Therapy Session Note  Patient Details  Name: Kelsey Smith MRN: [id number] Date of Birth: October 13, 1960 Today's Date: 10/03/2016  Order received and chart reviewed.  Eval deferred at this time due to low activity tolerance.  Will continue to monitor through team for future participation. Aria Jarrard 10/03/2016, 8:56 AM

## 2016-10-03 NOTE — Progress Notes (Signed)
Physical Therapy Note  Patient Details  Name: Kelsey Smith MRN: [id number] Date of Birth: 04-Jan-1961 Today's Date: 10/03/2016   Pain: 5/10 HA; pt stated she needed pain meds but had not asked for meds.  PT encouraged pt to call for meds which she received during session.  Pt stated she had wet herself; PT assisted with pt cleaning herself and donning new diaper and pants.  Gait with RW on level tile, min assist, mod cues for wider BOS, R foot placement, safe speed. neuromuscular re-education via demo and multimodal cues for pelvic dissociation in unsupported sitting.  Step taps with R foot with bil hand support on RW with 50% accuracy, due to R inattention. Pt left resting in bed with alarm set and all needs within reach.     see function navigator for current status.  Brodyn Depuy 10/03/2016, 3:20 PM

## 2016-10-03 NOTE — Progress Notes (Signed)
Speech Language Pathology Daily Session Note  Patient Details  Name: Kelsey Smith MRN: [id number] Date of Birth: [birth year]  Today's Date: 10/03/2016 SLP Individual Time: 1015-1100 SLP Individual Time Calculation (min): 45 min   Short Term Goals: Week 1: SLP Short Term Goal 1 (Week 1): Patient will demonstrate sustained attention to task for 5 minutes with Max A verbal cues for redirection.  SLP Short Term Goal 2 (Week 1): Patient will identify 2 cognitive and 2 physical deficits with Mod A question cues.  SLP Short Term Goal 3 (Week 1): Patient will demonstrate functional problem solving with Min A verbal cues.  SLP Short Term Goal 4 (Week 1): Patient will utilize schedule to anticipate upcoming therapy appointments, etc with Min A question cues.   Skilled Therapeutic Interventions: Skilled treatment session focused on cognitive goals. Patient completed a mildly complex medication management task of organizing a 4 time per day pill box with supervision verbal cues to self-monitor and correct 1 error. Patient demonstrated selective attention to task for ~10 minutes in a moderately distracting environment with Mod I. Patient incontinent of urine and required Min A verbal cues for problem solving during self-care tasks. Patient demonstrated anticipatory awareness by asking clinician questions in regards to her functional status at d/c and amount of assistance she would need. Clinician provided education in regards to therapy's current goals and tasks the patient needs to complete prior to d/c like calling for assistance to use the bathroom instead of constant incontinent episodes. She verbalized understanding. Patient handed off to OT. Continue with current plan of care.   Function:  Cognition Comprehension Comprehension assist level: Understands basic 90% of the time/cues < 10% of the time  Expression   Expression assist level: Expresses complex 90% of the time/cues < 10% of the time   Social Interaction Social Interaction assist level: Interacts appropriately 75 - 89% of the time - Needs redirection for appropriate language or to initiate interaction.  Problem Solving Problem solving assist level: Solves basic 75 - 89% of the time/requires cueing 10 - 24% of the time  Memory Memory assist level: Recognizes or recalls 75 - 89% of the time/requires cueing 10 - 24% of the time    Pain Pain Assessment Pain Assessment: 0-10 Pain Score: 5  Pain Type: Acute pain Pain Location: Head Pain Orientation: Right Pain Descriptors / Indicators: Aching Pain Frequency: Intermittent Pain Onset: With Activity Patients Stated Pain Goal: 3 Pain Intervention(s): Medication (See eMAR)  Therapy/Group: Individual Therapy  Kelsey Smith 10/03/2016, 3:31 PM

## 2016-10-03 NOTE — Progress Notes (Signed)
Occupational Therapy Session Note  Patient Details  Name: Kelsey Smith MRN: [id number] Date of Birth: 1960/10/23  Today's Date: 10/03/2016 OT Individual Time: 1100-1155 OT Individual Time Calculation (min): 55 min     Short Term Goals: Week 1:  OT Short Term Goal 1 (Week 1): Pt will perform 3/3 toileting with mod A for balance  OT Short Term Goal 2 (Week 1): Pt will don Lb clothing with min A sit to stand OT Short Term Goal 3 (Week 1): Pt will attend to functional task for 10 min with minimal cuing  OT Short Term Goal 4 (Week 1): Pt will demonstrate emergent awareness in functional context with min questioning cues  Skilled Therapeutic Interventions/Progress Updates:    Pt resting in w/c upon arrival with SLP present.  Pt engaged in BADL retraining including bathing at shower level and dressing with sit<>stand from w/c at sink.  Focus on functional transfers, sit<>stand, standing balance, task initiation, attention to task, sequencing, and safety awareness.  Pt continues to exhibit impulsive behaviors during transitional movements requiring max verbal cues for safety and sequencing.  Pt fatigues quickly, impacting her ability to complete bathing/dressing tasks.  Pt requires max encouragement to attempt and complete dressing tasks.  Pt returned to bed at end of session and remained in bed with all needs within reach and bed alarm activated.  Pt demonstrated use of call bell.  Therapy Documentation Precautions:  Precautions Precautions: Fall Restrictions Weight Bearing Restrictions: No Pain: Pt denied pain  See Function Navigator for Current Functional Status.   Therapy/Group: Individual Therapy  Rich BraveLanier, Thor Nannini Chappell 10/03/2016, 11:58 AM

## 2016-10-04 ENCOUNTER — Inpatient Hospital Stay (HOSPITAL_COMMUNITY): Payer: Medicaid Other | Admitting: Speech Pathology

## 2016-10-04 ENCOUNTER — Inpatient Hospital Stay (HOSPITAL_COMMUNITY): Payer: Self-pay

## 2016-10-04 ENCOUNTER — Inpatient Hospital Stay (HOSPITAL_COMMUNITY): Payer: Self-pay | Admitting: Physical Therapy

## 2016-10-04 DIAGNOSIS — I69119 Unspecified symptoms and signs involving cognitive functions following nontraumatic intracerebral hemorrhage: Secondary | ICD-10-CM

## 2016-10-04 MED ORDER — SENNOSIDES-DOCUSATE SODIUM 8.6-50 MG PO TABS
2.0000 | ORAL_TABLET | Freq: Two times a day (BID) | ORAL | Status: DC
  Administered 2016-10-04 – 2016-10-15 (×18): 2 via ORAL
  Filled 2016-10-04 (×25): qty 2

## 2016-10-04 MED ORDER — POLYETHYLENE GLYCOL 3350 17 G PO PACK
17.0000 g | PACK | Freq: Once | ORAL | Status: AC
  Administered 2016-10-04: 17 g via ORAL
  Filled 2016-10-04: qty 1

## 2016-10-04 MED ORDER — BROMOCRIPTINE MESYLATE 2.5 MG PO TABS
2.5000 mg | ORAL_TABLET | Freq: Two times a day (BID) | ORAL | Status: DC
  Administered 2016-10-04 – 2016-10-08 (×8): 2.5 mg via ORAL
  Filled 2016-10-04 (×10): qty 1

## 2016-10-04 MED ORDER — SORBITOL 70 % SOLN
30.0000 mL | Freq: Once | Status: AC
  Administered 2016-10-04: 30 mL via ORAL
  Filled 2016-10-04: qty 30

## 2016-10-04 NOTE — Progress Notes (Signed)
Social Work Patient ID: Kelsey Smith, female   DOB: 09/09/1961, 56 y.o.   MRN: [id number]   Have reviewed team conference information with pt and one of her roomates, [name].  They are aware and agreeable with targeted d/c date of 1/25 and supervision goals.  Mr. Doylene CanningBolin reports he will talk with two other roomates to coordinate 24/7 support.  Pt reports that she had hoped she might be ready sooner, however, agreeable to "do whatever I have to."  We also discussed need to complete a Medicaid and SSD application - pt agreeable and I have contacted financial counseling to request apps.  Will continue to follow.  Loyce Klasen, LCSW

## 2016-10-04 NOTE — Progress Notes (Signed)
Occupational Therapy Note  Patient Details  Name: Kelsey Smith MRN: [id number] Date of Birth: [birth year]  Today's Date: 10/04/2016 OT Individual Time: 1300-1330 OT Individual Time Calculation (min): 30 min    Pt denied pain Individual Therapy  Pt engaged in dynamic standing tasks requiring BUE use to complete tasks.  Pt completed activities with steady A.  Pt also engaged in tasks requiring squats while reaching.  Focus on increased standing balance to facilitate and increase independence with LB dressing tasks.  Pt returned to room and used toilet prior to returning to bed.  Pt remained in bed with all needs within reach and bed alarm activated.   Lavone NeriLanier, Dametrius Sanjuan [facility] IncChappell 10/04/2016, 3:03 PM

## 2016-10-04 NOTE — Progress Notes (Signed)
Occupational Therapy Session Note  Patient Details  Name: Kelsey Smith MRN: [id number] Date of Birth: 1961/01/02  Today's Date: 10/04/2016 OT Individual Time: 0700-0759 OT Individual Time Calculation (min): 59 min     Short Term Goals: Week 1:  OT Short Term Goal 1 (Week 1): Pt will perform 3/3 toileting with mod A for balance  OT Short Term Goal 2 (Week 1): Pt will don Lb clothing with min A sit to stand OT Short Term Goal 3 (Week 1): Pt will attend to functional task for 10 min with minimal cuing  OT Short Term Goal 4 (Week 1): Pt will demonstrate emergent awareness in functional context with min questioning cues  Skilled Therapeutic Interventions/Progress Updates:    Pt resting in bed upon arrival eating breakfast.  Pt engaged in BADL retraining including bathing at shower level and dressing with sit<>stand from w/c.  Pt required min A for functional transfers, sit<>stand, and standing balance.  Pt required assistance pulling up pants.  Pt continues to fatigue quickly but stated she preferred starting her day with a shower instead of later in morning.  Pt continues to exhibit impulsive behaviors during transitional movements.  Pt continues to be incontinent of bladder.  Discussed with RN and pt placed on Q2 toileting schedule. Therapy Documentation Precautions:  Precautions Precautions: Fall Restrictions Weight Bearing Restrictions: No   Pain:  Pt denied pain  See Function Navigator for Current Functional Status.   Therapy/Group: Individual Therapy  Kelsey BraveLanier, Kelsey Smith 10/04/2016, 8:00 AM

## 2016-10-04 NOTE — Progress Notes (Signed)
[facility] PHYSICAL MEDICINE & REHABILITATION     PROGRESS NOTE    Subjective/Complaints: Pt in bed without new complaints this am  ROS: pt denies nausea, vomiting, diarrhea, cough, shortness of breath or chest pain     Objective: Vital Signs: Blood pressure 108/61, pulse 80, temperature 98 F (36.7 C), temperature source Oral, resp. rate 18, weight 52.6 kg (115 lb 15.4 oz), SpO2 100 %. No results found. No results for input(s): WBC, HGB, HCT, PLT in the last 72 hours. No results for input(s): NA, K, CL, GLUCOSE, BUN, CREATININE, CALCIUM in the last 72 hours.  Invalid input(s): CO CBG (last 3)  No results for input(s): GLUCAP in the last 72 hours.  Wt Readings from Last 3 Encounters:  09/30/16 52.6 kg (115 lb 15.4 oz)  09/24/16 47.1 kg (103 lb 13.4 oz)    Physical Exam:  Constitutional: She is oriented to person, place, and time. She appears well-developed.  HENT:  Head: Normocephalic and atraumatic.  Eyes: PERRL.  Neck: Normal range of motion. Neck supple.  Cardiovascular:RRR   Respiratory: clear bilaterally. No wheezes  GI: Soft. Bowel sounds are normal. She exhibits no distension. There is no tenderness.  Musculoskeletal: She exhibits no edema or tenderness Neurological:   .  RUE with persistent ataxia.  Engages. Attention still limiting at times.  Has intact basic insight and awareness only.  Left central 7 Motor: R UE 4/5 proximal to distal, LUE 5/5 R LE 4/5 proximal to distal. LLE 4+ to 5/5. Ataxia RUE Sensation intact to light touch DTRs symmetric  Skin: Skin is warm and dry.  Right shin /media left knee wounds  Healing/foam dressing Psychiatric: Her affect is blunt.    Assessment/Plan: 1. Functional and mobility deficits secondary to left thalamic ICH which require 3+ hours per day of interdisciplinary therapy in a comprehensive inpatient rehab setting. Physiatrist is providing close team supervision and 24 hour management of active medical problems  listed below. Physiatrist and rehab team continue to assess barriers to discharge/monitor patient progress toward functional and medical goals.  Function:  Bathing Bathing position   Position: Shower  Bathing parts Body parts bathed by patient: Right arm, Chest, Abdomen, Front perineal area, Right upper leg, Left upper leg, Left arm, Left lower leg, Right lower leg Body parts bathed by helper: Buttocks, Back  Bathing assist Assist Level: Touching or steadying assistance(Pt > 75%)      Upper Body Dressing/Undressing Upper body dressing   What is the patient wearing?: Pull over shirt/dress     Pull over shirt/dress - Perfomed by patient: Thread/unthread right sleeve, Thread/unthread left sleeve, Put head through opening, Pull shirt over trunk          Upper body assist Assist Level: Supervision or verbal cues      Lower Body Dressing/Undressing Lower body dressing   What is the patient wearing?: Underwear, Pants, Non-skid slipper socks Underwear - Performed by patient: Thread/unthread right underwear leg, Thread/unthread left underwear leg Underwear - Performed by helper: Pull underwear up/down Pants- Performed by patient: Thread/unthread right pants leg, Thread/unthread left pants leg Pants- Performed by helper: Fasten/unfasten pants Non-skid slipper socks- Performed by patient: Don/doff right sock, Don/doff left sock Non-skid slipper socks- Performed by helper: Don/doff right sock, Don/doff left sock                  Lower body assist Assist for lower body dressing: Touching or steadying assistance (Pt > 75%)      Toileting Toileting  Toileting steps completed by patient:  (No clothing) Toileting steps completed by helper: Performs perineal hygiene Toileting Assistive Devices: Grab bar or rail  Toileting assist Assist level: Touching or steadying assistance (Pt.75%)   Transfers Chair/bed transfer   Chair/bed transfer method: Stand pivot Chair/bed transfer  assist level: Touching or steadying assistance (Pt > 75%) Chair/bed transfer assistive device: Armrests, Patent attorneyWalker     Locomotion Ambulation     Max distance: 50 Assist level: Touching or steadying assistance (Pt > 75%)   Wheelchair   Type: Manual Max wheelchair distance: 50 Assist Level: Touching or steadying assistance (Pt > 75%)  Cognition Comprehension Comprehension assist level: Understands basic 90% of the time/cues < 10% of the time  Expression Expression assist level: Expresses complex 90% of the time/cues < 10% of the time  Social Interaction Social Interaction assist level: Interacts appropriately 75 - 89% of the time - Needs redirection for appropriate language or to initiate interaction.  Problem Solving Problem solving assist level: Solves basic 75 - 89% of the time/requires cueing 10 - 24% of the time  Memory Memory assist level: Recognizes or recalls 75 - 89% of the time/requires cueing 10 - 24% of the time   Medical Problem List and Plan: 1.  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  -continue CIR PT, OT, SLP therapies  -showing some signs of improved awareness but can be inconsistent 2.  DVT Prophylaxis/Anticoagulation: Pharmaceutical: Lovenox 3. Pain Management: tylenol prn.  4. Mood: LCSW to follow for evaluation and support.   -neuropsych eval appreciated  -on buspar and paxil currently  -have concerns about ritalin due to drug history. Will try low dose bromocriptine to see if there's a response 5. Neuropsych: This patient is not fully capable of making decisions on her own behalf.    6. Right leg wound/Skin/Wound Care: wounds healing nicely  7. Fluids/Electrolytes/Nutrition: encourage PO  -appreciate dietary consult  -supplementing potassium- BMP Latest Ref Rng & Units 10/01/2016 09/27/2016 09/26/2016  Glucose 65 - 99 mg/dL 161(W167(H) 960(A120(H) 540(J113(H)  BUN 6 - 20 mg/dL 16 9 10   Creatinine 0.44 - 1.00  mg/dL 8.110.87 9.140.50 7.820.72  Sodium 135 - 145 mmol/L 136 135 140  Potassium 3.5 - 5.1 mmol/L 4.3 3.3(L) 3.7  Chloride 101 - 111 mmol/L 98(L) 101 107  CO2 22 - 32 mmol/L 28 24 25   Calcium 8.9 - 10.3 mg/dL 9.8 9.9 9.3    -megace seems to have helped appetite to some extent but still inconsistent 8. HTN: On lisinopril.   -NTG paste prn for elevated BS.  -fair control at present  -no changes today 9. Polysubstance abuse: has completed CIWA protocol. Counsel. 10  Nausea- seems better after BM  -continue to follow       LOS (Days) 8 A FACE TO FACE EVALUATION WAS PERFORMED  Ranelle OysterSWARTZ,Jenay Morici T, MD 10/04/2016 9:14 AM

## 2016-10-04 NOTE — Progress Notes (Signed)
Physical Therapy Weekly Progress Note  Patient Details  Name: [place] L Deak MRN: [id number] Date of Birth: Feb 18, 1961  Beginning of progress report period: September 27, 2016 End of progress report period: October 04, 2016  Today's Date: 10/04/2016 PT Individual Time: 9980-6999 PT Individual Time Calculation (min): 45 min   Pt participated in PT session with no c/o pain. Transfers to w/c and toilet with close supervions/min A. Pt requires min A for standing balance with clothing negotiation after toileting.  Gait training without AFO, with PLS AFO and with ace wrap all with RW and min A. Pt with best results with ace wrap at this time to prevent foot drop. Pt improving gait speed and ability to clear Rt LE during gait.   Patient has met 2 of 2 short term goals.  Pt improving gait, balance and mobility and is now min A for all mobility tasks.  Patient continues to demonstrate the following deficits muscle weakness, abnormal tone and decreased coordination, decreased attention, decreased awareness and delayed processing and decreased standing balance, decreased postural control, hemiplegia and decreased balance strategies and therefore will continue to benefit from skilled PT intervention to increase functional independence with mobility.  Patient progressing toward long term goals..  Continue plan of care.  PT Short Term Goals Week 1:  PT Short Term Goal 1 (Week 1): Pt will perform functional transfers with min A PT Short Term Goal 1 - Progress (Week 1): Met PT Short Term Goal 2 (Week 1): Pt will perform gait with max A x 20' in controlled enviornment PT Short Term Goal 2 - Progress (Week 1): Met Week 2:  PT Short Term Goal 1 (Week 2): Pt will perform gait x 50' in controlled environment with supervision PT Short Term Goal 2 (Week 2): Pt will demo dynamic balance for functional task with min A  Skilled Therapeutic Interventions/Progress Updates:  Ambulation/gait training;Cognitive  remediation/compensation;Discharge planning;DME/adaptive equipment instruction;Functional mobility training;Pain management;Splinting/orthotics;Therapeutic Activities;UE/LE Strength taining/ROM;Wheelchair propulsion/positioning;UE/LE Coordination activities;Therapeutic Exercise;Stair training;Patient/family education;Neuromuscular re-education;Functional electrical stimulation;Disease management/prevention;Community reintegration;Balance/vestibular training     See Function Navigator for Current Functional Status.  Therapy/Group: Individual Therapy  Stephone Gum 10/04/2016, 11:14 AM

## 2016-10-04 NOTE — Progress Notes (Signed)
Speech Language Pathology Weekly Progress and Session Note  Patient Details  Name: Kelsey Smith MRN: [id number] Date of Birth: 1960-10-21  Beginning of progress report period: September 28, 2015 End of progress report period: October 05, 2015  Today's Date: 10/04/2016 SLP Individual Time: 0830-0930 SLP Individual Time Calculation (min): 60 min   Short Term Goals: Week 1: SLP Short Term Goal 1 (Week 1): Patient will demonstrate sustained attention to task for 5 minutes with Max A verbal cues for redirection.  SLP Short Term Goal 1 - Progress (Week 1): Met SLP Short Term Goal 2 (Week 1): Patient will identify 2 cognitive and 2 physical deficits with Mod A question cues.  SLP Short Term Goal 2 - Progress (Week 1): Met SLP Short Term Goal 3 (Week 1): Patient will demonstrate functional problem solving with Min A verbal cues.  SLP Short Term Goal 3 - Progress (Week 1): Met SLP Short Term Goal 4 (Week 1): Patient will utilize schedule to anticipate upcoming therapy appointments, etc with Min A question cues.  SLP Short Term Goal 4 - Progress (Week 1): Met    New Short Term Goals: Week 2: SLP Short Term Goal 1 (Week 2): Patient will recall new, daily information with supervision verbal cues.  SLP Short Term Goal 2 (Week 2): Patient will demonstrate complex problem solving for familiar tasks with supervision verbal cues.  SLP Short Term Goal 3 (Week 2): Patient will demonstrate selective attention to task in a moderately disctracting enviornment with supervision verbal cues for redirection.  SLP Short Term Goal 4 (Week 2): Patient will self-monitor and correct errors during functional tasks with supervision verbal cues.   Weekly Progress Updates: Patient has made excellent gains and has met 4 of 4 STG's this reporting period due to improved cognitive function. Currently, patient requires overall supervision-Min A to complete functional and familiar tasks safely in regards to recall, problem  solving, attention and awareness. Patient demonstrates increased arousal and initiation which is positively impacting her cognitive function and progress. Patient would benefit from continued skilled SLP intervention to maximize her cognitive function and overall functional independence prior to discharge.    Intensity: Minumum of 1-2 x/day, 30 to 90 minutes Frequency: 3 to 5 out of 7 days Duration/Length of Stay: 1/25 Treatment/Interventions: Cognitive remediation/compensation;Cueing hierarchy;Functional tasks;Patient/family education;Internal/external aids;Environmental controls;Therapeutic Activities   Daily Session  Skilled Therapeutic Interventions: Skilled treatment session focused on cognitive goals. Upon arrival, patient was awake while supine in bed and reported she had been incontinent of urine. SLP facilitated session by providing Min A verbal cues for sequencing and problem solving with self-care tasks. SLP facilitated session by providing intermittent verbal cues for complex problem solving during a mildly complex, new learning task. Patient demonstrated selective attention to task in a mildly distracting enviornment for 30 minutes with Mod I. Patient continues to demonstrate increased anticipatory awareness in regards for need to change her current "lifestyle" at discharge to maximize overall safety and independence. Patient left supine in bed with all needs within reach. Continue with current plan of care.        Function:   Cognition Comprehension Comprehension assist level: Understands complex 90% of the time/cues 10% of the time  Expression   Expression assist level: Expresses complex 90% of the time/cues < 10% of the time  Social Interaction Social Interaction assist level: Interacts appropriately 90% of the time - Needs monitoring or encouragement for participation or interaction.  Problem Solving Problem solving assist level: Solves complex 90%  of the time/cues < 10% of the  time  Memory Memory assist level: Recognizes or recalls 75 - 89% of the time/requires cueing 10 - 24% of the time   Pain No/Denies Pain   Therapy/Group: Individual Therapy  Kelsey Smith, [place] 10/04/2016, 3:08 PM

## 2016-10-05 ENCOUNTER — Inpatient Hospital Stay (HOSPITAL_COMMUNITY): Payer: Self-pay | Admitting: Occupational Therapy

## 2016-10-05 ENCOUNTER — Inpatient Hospital Stay (HOSPITAL_COMMUNITY): Payer: Self-pay

## 2016-10-05 ENCOUNTER — Inpatient Hospital Stay (HOSPITAL_COMMUNITY): Payer: Self-pay | Admitting: Physical Therapy

## 2016-10-05 ENCOUNTER — Inpatient Hospital Stay (HOSPITAL_COMMUNITY): Payer: Medicaid Other | Admitting: Speech Pathology

## 2016-10-05 NOTE — Progress Notes (Signed)
Speech Language Pathology Daily Session Note  Patient Details  Name: Kelsey Smith MRN: [id number] Date of Birth: Jun 14, 1961  Today's Date: 10/05/2016 SLP Individual Time: [id number] SLP Individual Time Calculation (min): 55 min   Short Term Goals: Week 2: SLP Short Term Goal 1 (Week 2): Patient will recall new, daily information with supervision verbal cues.  SLP Short Term Goal 2 (Week 2): Patient will demonstrate complex problem solving for familiar tasks with supervision verbal cues.  SLP Short Term Goal 3 (Week 2): Patient will demonstrate selective attention to task in a moderately disctracting enviornment with supervision verbal cues for redirection.  SLP Short Term Goal 4 (Week 2): Patient will self-monitor and correct errors during functional tasks with supervision verbal cues.   Skilled Therapeutic Interventions: Skilled treatment session focused on cognitive goals. Upon arrival, patient was asleep in bed and reported she did not feel well but was agreeable to participate in treatment session. Patient required Mod-Max A verbal cues for problem solving and recall with use of strategies during a mildly complex, novel task. Patient demonstrated emergent awareness of difficulty of task with Mod I. Patient transferred back to bed with Min A verbal cues needed for safety. Patient left with alarm on and all needs within reach. Continue with current plan of care.   Function:  Cognition Comprehension Comprehension assist level: Understands basic 90% of the time/cues < 10% of the time  Expression   Expression assist level: Expresses complex 90% of the time/cues < 10% of the time  Social Interaction Social Interaction assist level: Interacts appropriately 90% of the time - Needs monitoring or encouragement for participation or interaction.  Problem Solving Problem solving assist level: Solves complex 90% of the time/cues < 10% of the time  Memory Memory assist level: Recognizes or recalls  90% of the time/requires cueing < 10% of the time    Pain Headache, RN made aware and administered medications   Therapy/Group: Individual Therapy  Jionni Helming 10/05/2016, 4:04 PM

## 2016-10-05 NOTE — Progress Notes (Signed)
[facility] PHYSICAL MEDICINE & REHABILITATION     PROGRESS NOTE    Subjective/Complaints: Lying in bed. Just awakening. No apparent issues overnight. Ate 100% yesterday  ROS: pt denies nausea, vomiting, diarrhea, cough, shortness of breath or chest pain      Objective: Vital Signs: Blood pressure 94/68, pulse 71, temperature 98 F (36.7 C), temperature source Oral, resp. rate 16, weight 52.7 kg (116 lb 1.6 oz), SpO2 100 %. No results found. No results for input(s): WBC, HGB, HCT, PLT in the last 72 hours. No results for input(s): NA, K, CL, GLUCOSE, BUN, CREATININE, CALCIUM in the last 72 hours.  Invalid input(s): CO CBG (last 3)  No results for input(s): GLUCAP in the last 72 hours.  Wt Readings from Last 3 Encounters:  10/05/16 52.7 kg (116 lb 1.6 oz)  09/24/16 47.1 kg (103 lb 13.4 oz)    Physical Exam:  Constitutional: She is oriented to person, place, and time. She appears well-developed.  HENT:  Head: Normocephalic and atraumatic.  Eyes: PERRL.  Neck: Normal range of motion. Neck supple.  Cardiovascular:RRR   Respiratory: clear bilaterally. No wheezes  GI: Soft. Bowel sounds are normal. She exhibits no distension. There is no tenderness.  Musculoskeletal: She exhibits no edema or tenderness Neurological:   .  RUE with ataxia.  Attention seems more consistent  Has intact basic insight and awareness only.  Left central 7 Motor: R UE 4/5 proximal to distal, LUE 5/5 R LE 4/5 proximal to distal. LLE 4+ to 5/5. Ataxia RUE Sensation intact to light touch DTRs symmetric  Skin: Skin is warm and dry.  Right leg is healing Psychiatric: Her affect is blunt.    Assessment/Plan: 1. Functional and mobility deficits secondary to left thalamic ICH which require 3+ hours per day of interdisciplinary therapy in a comprehensive inpatient rehab setting. Physiatrist is providing close team supervision and 24 hour management of active medical problems listed  below. Physiatrist and rehab team continue to assess barriers to discharge/monitor patient progress toward functional and medical goals.  Function:  Bathing Bathing position   Position: Shower  Bathing parts Body parts bathed by patient: Right arm, Chest, Abdomen, Front perineal area, Right upper leg, Left upper leg, Left arm, Left lower leg, Right lower leg, Buttocks (lateral leans for buttocks) Body parts bathed by helper: Buttocks, Back  Bathing assist Assist Level: Supervision or verbal cues      Upper Body Dressing/Undressing Upper body dressing   What is the patient wearing?: Pull over shirt/dress     Pull over shirt/dress - Perfomed by patient: Thread/unthread right sleeve, Thread/unthread left sleeve, Put head through opening, Pull shirt over trunk          Upper body assist Assist Level: Supervision or verbal cues      Lower Body Dressing/Undressing Lower body dressing   What is the patient wearing?: Underwear, Pants, Non-skid slipper socks Underwear - Performed by patient: Thread/unthread right underwear leg, Thread/unthread left underwear leg Underwear - Performed by helper: Pull underwear up/down Pants- Performed by patient: Thread/unthread right pants leg, Thread/unthread left pants leg, Pull pants up/down Pants- Performed by helper: Fasten/unfasten pants Non-skid slipper socks- Performed by patient: Don/doff right sock, Don/doff left sock Non-skid slipper socks- Performed by helper: Don/doff right sock, Don/doff left sock                  Lower body assist Assist for lower body dressing: Touching or steadying assistance (Pt > 75%)  Toileting Toileting   Toileting steps completed by patient:  (No clothing) Toileting steps completed by helper: Performs perineal hygiene Toileting Assistive Devices: Grab bar or rail  Toileting assist Assist level: Touching or steadying assistance (Pt.75%)   Transfers Chair/bed transfer   Chair/bed transfer method:  Stand pivot Chair/bed transfer assist level: Touching or steadying assistance (Pt > 75%) Chair/bed transfer assistive device: Armrests, Patent attorneyWalker     Locomotion Ambulation     Max distance: 50 Assist level: Touching or steadying assistance (Pt > 75%)   Wheelchair   Type: Manual Max wheelchair distance: 50 Assist Level: Touching or steadying assistance (Pt > 75%)  Cognition Comprehension Comprehension assist level: Understands basic 90% of the time/cues < 10% of the time  Expression Expression assist level: Expresses complex 90% of the time/cues < 10% of the time  Social Interaction Social Interaction assist level: Interacts appropriately 90% of the time - Needs monitoring or encouragement for participation or interaction.  Problem Solving Problem solving assist level: Solves complex 90% of the time/cues < 10% of the time  Memory Memory assist level: Recognizes or recalls 90% of the time/requires cueing < 10% of the time   Medical Problem List and Plan: 1.  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  -continue CIR PT, OT, SLP therapies  -impulsive at times 2.  DVT Prophylaxis/Anticoagulation: Pharmaceutical: Lovenox 3. Pain Management: tylenol prn.  4. Mood: LCSW to follow for evaluation and support.   -neuropsych eval appreciated  -on buspar and paxil currently  -have initiated low dose bromocriptine for attention/arousal   -bp/hr stable 5. Neuropsych: This patient is not fully capable of making decisions on her own behalf.    6. Right leg wound/Skin/Wound Care: wounds healing nicely  7. Fluids/Electrolytes/Nutrition: encourage PO  -appreciate dietary consult  -supplementing potassium- BMP Latest Ref Rng & Units 10/01/2016 09/27/2016 09/26/2016  Glucose 65 - 99 mg/dL 956(O167(H) 130(Q120(H) 657(Q113(H)  BUN 6 - 20 mg/dL 16 9 10   Creatinine 0.44 - 1.00 mg/dL 4.690.87 6.290.50 5.280.72  Sodium 135 - 145 mmol/L 136 135 140  Potassium 3.5 - 5.1  mmol/L 4.3 3.3(L) 3.7  Chloride 101 - 111 mmol/L 98(L) 101 107  CO2 22 - 32 mmol/L 28 24 25   Calcium 8.9 - 10.3 mg/dL 9.8 9.9 9.3    -megace has helped pain.  8. HTN: On lisinopril.   -NTG paste prn for elevated BS.  -fair control at present  -no changes today 9. Polysubstance abuse: has completed CIWA protocol. Counsel.       LOS (Days) 9 A FACE TO FACE EVALUATION WAS PERFORMED  Ranelle OysterSWARTZ,Whyatt Klinger T, MD 10/05/2016 10:16 AM

## 2016-10-05 NOTE — Progress Notes (Signed)
Occupational Therapy Session Note  Patient Details  Name: Kelsey Smith MRN: [id number] Date of Birth: July 05, 1961  Today's Date: 10/05/2016 OT Individual Time: 1430-1500 OT Individual Time Calculation (min): 30 min     Short Term Goals: Week 1:  OT Short Term Goal 1 (Week 1): Pt will perform 3/3 toileting with mod A for balance  OT Short Term Goal 1 - Progress (Week 1): Met OT Short Term Goal 2 (Week 1): Pt will don Lb clothing with min A sit to stand OT Short Term Goal 2 - Progress (Week 1): Met OT Short Term Goal 3 (Week 1): Pt will attend to functional task for 10 min with minimal cuing  OT Short Term Goal 3 - Progress (Week 1): Met OT Short Term Goal 4 (Week 1): Pt will demonstrate emergent awareness in functional context with min questioning cues OT Short Term Goal 4 - Progress (Week 1): Progressing toward goal Week 2:  OT Short Term Goal 1 (Week 2): Pt will demonstrate emergent awareness in functional context with min questioning cues OT Short Term Goal 2 (Week 2): Pt will perform toilet transfers with min A OT Short Term Goal 3 (Week 2): Pt will perform tub/shower transfers with min A OT Short Term Goal 4 (Week 2): Pt will perform toileting tasks with steady A  Skilled Therapeutic Interventions/Progress Updates:   1:1 Pt asleep when arrived; slow to wake. While talking pt became incontinent of urine. Able to transfer into w/c and to toilet with min A to finish voiding in toilet. LB clothing changed with mod A and washed periarea. Pt able to pull up clothing with steadying A without bilateral UE support. Pt transitioned via w/c to laundry room to perform laundry task in standing with focus on maintain dynamic standing balance without UE support with steadying A. Returned to bed and into recliner.   Therapy Documentation Precautions:  Precautions Precautions: Fall Restrictions Weight Bearing Restrictions: No    Pain: No c/o pain See Function Navigator for Current  Functional Status.   Therapy/Group: Individual Therapy  Willeen Cass [facility] 10/05/2016, 3:23 PM

## 2016-10-05 NOTE — Progress Notes (Signed)
Occupational Therapy Session Note  Patient Details  Name: [name] MRN: [id number] Date of Birth: 12-19-1960  Today's Date: 10/05/2016 OT Individual Time: 0700-0758 OT Individual Time Calculation (min): 58 min     Short Term Goals: Week 2:  OT Short Term Goal 1 (Week 2): Pt will demonstrate emergent awareness in functional context with min questioning cues OT Short Term Goal 2 (Week 2): Pt will perform toilet transfers with min A OT Short Term Goal 3 (Week 2): Pt will perform tub/shower transfers with min A OT Short Term Goal 4 (Week 2): Pt will perform toileting tasks with steady A  Skilled Therapeutic Interventions/Progress Updates:    Pt engaged in BADL retraining including bathing at shower level and dressing with sit<>stand from w/c at sink.  Focus on safe functional transfers, sit<>stand, standing balance, increase RUE use, activity tolerance, and safety awareness.  Pt continues to exhibit impulsive behaviors during functional transfers and requires mod verbal cues for safety and RLE placement with transfers and functional amb without AD.  Pt amb in room with HHA (min A and mod verbal cues to advance her RLE). Pt completed bathing seated on shower seat with lateral leans for bathing her buttocks.  Pt completed grooming tasks seated at sink.  Pt returned to bed and remained in bed with all needs within reach and bed alarm activated. Therapy Documentation Precautions:  Precautions Precautions: Fall Restrictions Weight Bearing Restrictions: No Pain:  Pt c/o headache (3/10) and not feeling as "chipper" as previous day, RN aware See Function Navigator for Current Functional Status.   Therapy/Group: Individual Therapy  Rich BraveLanier, Luccas Towell Chappell 10/05/2016, 8:00 AM

## 2016-10-05 NOTE — Progress Notes (Signed)
Physical Therapy Note  Patient Details  Name: Kelsey Smith MRN: [id number] Date of Birth: Dec 03, 1960 Today's Date: 10/05/2016    Time: 1100-1130 30 minutes  1:1 Pt with no c/o pain, c/o "not feeling right" but willing to attempt PT.  Pt able to perform bed mobility and squat pivot transfer to w/c with close supervision.  Supervision for sitting balance on EOB to don shoes. Sit to stand multiple attempts with min A, pt requires frequent rests due to fatigue. Gait x 100' with min A with pt c/o "not feeling well".  Pt seated and BP 108/61. Pt agreeable to attempt nu step. Pt able to pedal x 2 minutes before not feeling well enough to continue. Pt back to bed and RN made aware.  Missed 30 minutes skilled PT   Milderd Manocchio 10/05/2016, 11:34 AM

## 2016-10-05 NOTE — Progress Notes (Signed)
Occupational Therapy Weekly Progress Note  Patient Details  Name: Kelsey Smith MRN: [id number] Date of Birth: November 11, 1960  Beginning of progress report period: September 27, 2016 End of progress report period: October 05, 2016  Patient has met 3 of 4 short term goals.  Pt made slow progress earlier in the week secondary to fatigue and inability to actively participate in therapy.  She has made consistent progress with BADLs over the past three days with improvements noted in increased use of her RUE, functional transfers, sitting balance, and standing balance.  Pt continues to exhibit impulsive behaviors during transitional movements.   Patient continues to demonstrate the following deficits:acute pain, ataxia, cognitive deficits and hemiplegia affecting dominant side and therefore will continue to benefit from skilled OT intervention to enhance overall performance with BADL.  Patient progressing toward long term goals..  Continue plan of care.  OT Short Term Goals Week 1:  OT Short Term Goal 1 (Week 1): Pt will perform 3/3 toileting with mod A for balance  OT Short Term Goal 1 - Progress (Week 1): Met OT Short Term Goal 2 (Week 1): Pt will don Lb clothing with min A sit to stand OT Short Term Goal 2 - Progress (Week 1): Met OT Short Term Goal 3 (Week 1): Pt will attend to functional task for 10 min with minimal cuing  OT Short Term Goal 3 - Progress (Week 1): Met OT Short Term Goal 4 (Week 1): Pt will demonstrate emergent awareness in functional context with min questioning cues OT Short Term Goal 4 - Progress (Week 1): Progressing toward goal Week 2:  OT Short Term Goal 1 (Week 2): Pt will demonstrate emergent awareness in functional context with min questioning cues OT Short Term Goal 2 (Week 2): Pt will perform toilet transfers with min A OT Short Term Goal 3 (Week 2): Pt will perform tub/shower transfers with min A OT Short Term Goal 4 (Week 2): Pt will perform toileting tasks with  steady A   Therapy Documentation Precautions:  Precautions Precautions: Fall Restrictions Weight Bearing Restrictions: No    See Function Navigator for Current Functional Status.     Leotis Shames [facility] 10/05/2016, 6:52 AM

## 2016-10-06 DIAGNOSIS — R5383 Other fatigue: Secondary | ICD-10-CM

## 2016-10-06 DIAGNOSIS — I10 Essential (primary) hypertension: Secondary | ICD-10-CM

## 2016-10-06 DIAGNOSIS — G8191 Hemiplegia, unspecified affecting right dominant side: Secondary | ICD-10-CM

## 2016-10-06 DIAGNOSIS — K5901 Slow transit constipation: Secondary | ICD-10-CM

## 2016-10-06 MED ORDER — BISACODYL 10 MG RE SUPP
10.0000 mg | Freq: Once | RECTAL | Status: DC
  Filled 2016-10-06 (×2): qty 1

## 2016-10-06 MED ORDER — POLYETHYLENE GLYCOL 3350 17 G PO PACK
17.0000 g | PACK | Freq: Two times a day (BID) | ORAL | Status: DC
  Administered 2016-10-06 – 2016-10-12 (×11): 17 g via ORAL
  Filled 2016-10-06 (×20): qty 1

## 2016-10-06 NOTE — Plan of Care (Signed)
Problem: RH SKIN INTEGRITY Goal: RH STG SKIN FREE OF INFECTION/BREAKDOWN Outcome: Progressing Mod I

## 2016-10-06 NOTE — Progress Notes (Addendum)
[facility]ICAL MEDICINE & REHABILITATION     PROGRESS NOTE    Subjective/Complaints: Pt sitting up in her chair this AM.  She had difficulty sleeping overnight because she was excited her friend stayed with her.   ROS: +Constipation. Denies nausea, vomiting, diarrhea, shortness of breath or chest pain  Objective: Vital Signs: Blood pressure 108/76, pulse 81, temperature 98.7 F (37.1 C), temperature source Oral, resp. rate 18, weight 52.7 kg (116 lb 1.6 oz), SpO2 100 %. No results found. No results for input(s): WBC, HGB, HCT, PLT in the last 72 hours. No results for input(s): NA, K, CL, GLUCOSE, BUN, CREATININE, CALCIUM in the last 72 hours.  Invalid input(s): CO CBG (last 3)  No results for input(s): GLUCAP in the last 72 hours.  Wt Readings from Last 3 Encounters:  10/05/16 52.7 kg (116 lb 1.6 oz)  09/24/16 47.1 kg (103 lb 13.4 oz)    Physical Exam:  Constitutional: She appears well-developed, well-nourished.  HENT: Normocephalic and atraumatic.  Eyes: EOMI. No discharge.  Cardiovascular: RRR. No JVD. Respiratory: clear bilaterally. No wheezes  GI: Bowel sounds are normal. She exhibits no distension.  Musculoskeletal: She exhibits no edema or tenderness Neurological:  Left central 7 Motor: RUE 4+/5 proximal to distal. RUE with ataxia.  LUE 5/5 RLE 3/5 HF, 4/5 distally.  LLE 4+ to 5/5. Skin: Skin is warm and dry. Ulcer medial right knee healing Psychiatric: Her affect normal. Behavior is normal.   Assessment/Plan: 1. Functional and mobility deficits secondary to left thalamic ICH which require 3+ hours per day of interdisciplinary therapy in a comprehensive inpatient rehab setting. Physiatrist is providing close team supervision and 24 hour management of active medical problems listed below. Physiatrist and rehab team continue to assess barriers to discharge/monitor patient progress toward functional and medical goals.  Function:  Bathing Bathing position    Position: Shower  Bathing parts Body parts bathed by patient: Right arm, Chest, Abdomen, Front perineal area, Right upper leg, Left upper leg, Left arm, Left lower leg, Right lower leg, Buttocks (lateral leans for buttocks) Body parts bathed by helper: Buttocks, Back  Bathing assist Assist Level: Supervision or verbal cues      Upper Body Dressing/Undressing Upper body dressing   What is the patient wearing?: Pull over shirt/dress     Pull over shirt/dress - Perfomed by patient: Thread/unthread right sleeve, Thread/unthread left sleeve, Put head through opening, Pull shirt over trunk          Upper body assist Assist Level: Supervision or verbal cues      Lower Body Dressing/Undressing Lower body dressing   What is the patient wearing?: Underwear, Pants, Non-skid slipper socks Underwear - Performed by patient: Thread/unthread right underwear leg, Thread/unthread left underwear leg Underwear - Performed by helper: Pull underwear up/down Pants- Performed by patient: Thread/unthread right pants leg, Thread/unthread left pants leg, Pull pants up/down Pants- Performed by helper: Fasten/unfasten pants Non-skid slipper socks- Performed by patient: Don/doff right sock, Don/doff left sock Non-skid slipper socks- Performed by helper: Don/doff right sock, Don/doff left sock                  Lower body assist Assist for lower body dressing: Touching or steadying assistance (Pt > 75%)      Toileting Toileting   Toileting steps completed by patient:  (No clothing) Toileting steps completed by helper: Performs perineal hygiene Toileting Assistive Devices: Grab bar or rail  Toileting assist Assist level: Touching or steadying assistance (Pt.75%)  Transfers Chair/bed transfer   Chair/bed transfer method: Stand pivot Chair/bed transfer assist level: Touching or steadying assistance (Pt > 75%) Chair/bed transfer assistive device: Armrests, Patent attorney      Max distance: 50 Assist level: Touching or steadying assistance (Pt > 75%)   Wheelchair   Type: Manual Max wheelchair distance: 50 Assist Level: Touching or steadying assistance (Pt > 75%)  Cognition Comprehension Comprehension assist level: Understands basic 90% of the time/cues < 10% of the time  Expression Expression assist level: Expresses complex 90% of the time/cues < 10% of the time  Social Interaction Social Interaction assist level: Interacts appropriately 90% of the time - Needs monitoring or encouragement for participation or interaction.  Problem Solving Problem solving assist level: Solves complex 90% of the time/cues < 10% of the time  Memory Memory assist level: Recognizes or recalls 90% of the time/requires cueing < 10% of the time   Medical Problem List and Plan: 1.  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  -continue CIR  -impulsive at times 2.  DVT Prophylaxis/Anticoagulation: Pharmaceutical: Lovenox 3. Pain Management: tylenol prn.  4. Mood: LCSW to follow for evaluation and support.   -neuropsych eval appreciated  -on buspar and paxil currently  -initiated low dose bromocriptine for attention/arousal 5. Neuropsych: This patient is not fully capable of making decisions on her own behalf. 6. Right leg wound/Skin/Wound Care: wounds healing  7. Fluids/Electrolytes/Nutrition: encourage PO  -appreciate dietary consult  -supplementing potassium  -megace has helped  8. HTN: On lisinopril.   -NTG paste prn.  -controlled 1/13 9. Polysubstance abuse: has completed CIWA protocol. Counsel. 10. Constipation  Bowel reg increased on 1/13       LOS (Days) 10 A FACE TO FACE EVALUATION WAS PERFORMED  Rahul Malinak Karis Juba, MD 10/06/2016 12:43 PM

## 2016-10-07 ENCOUNTER — Inpatient Hospital Stay (HOSPITAL_COMMUNITY): Payer: Self-pay | Admitting: Physical Therapy

## 2016-10-07 MED ORDER — BISACODYL 10 MG RE SUPP
10.0000 mg | Freq: Once | RECTAL | Status: AC
  Administered 2016-10-07: 10 mg via RECTAL
  Filled 2016-10-07: qty 1

## 2016-10-07 MED ORDER — MAGNESIUM HYDROXIDE 400 MG/5ML PO SUSP
30.0000 mL | Freq: Once | ORAL | Status: AC
  Administered 2016-10-07: 30 mL via ORAL
  Filled 2016-10-07: qty 30

## 2016-10-07 NOTE — Progress Notes (Signed)
[facility] PHYSICAL MEDICINE & REHABILITATION     PROGRESS NOTE    Subjective/Complaints: Pt laying in bed this AM.  She slept well overnight and denies complaints this AM.    ROS: +Constipation. Denies nausea, vomiting, diarrhea, shortness of breath or chest pain  Objective: Vital Signs: Blood pressure 96/65, pulse 73, temperature 98.2 F (36.8 C), temperature source Oral, resp. rate 18, weight 52.7 kg (116 lb 1.6 oz), SpO2 100 %. No results found. No results for input(s): WBC, HGB, HCT, PLT in the last 72 hours. No results for input(s): NA, K, CL, GLUCOSE, BUN, CREATININE, CALCIUM in the last 72 hours.  Invalid input(s): CO CBG (last 3)  No results for input(s): GLUCAP in the last 72 hours.  Wt Readings from Last 3 Encounters:  10/05/16 52.7 kg (116 lb 1.6 oz)  09/24/16 47.1 kg (103 lb 13.4 oz)    Physical Exam:  Constitutional: She appears well-developed, well-nourished.  HENT: Normocephalic and atraumatic.  Eyes: EOMI. No discharge.  Cardiovascular: RRR. No JVD. Respiratory: clear bilaterally. Unlabored GI: Bowel sounds are normal. She exhibits no distension.  Musculoskeletal: She exhibits no edema or tenderness Neurological:  Left central 7 Motor: RUE 4+/5 proximal to distal. RUE with ataxia.  LUE 5/5 RLE 3/5 HF, 4/5 distally (stable).  LLE 4+ to 5/5. Skin: Skin is warm and dry. Ulcer medial right knee healing Psychiatric: Her affect normal. Behavior is normal.   Assessment/Plan: 1. Functional and mobility deficits secondary to left thalamic ICH which require 3+ hours per day of interdisciplinary therapy in a comprehensive inpatient rehab setting. Physiatrist is providing close team supervision and 24 hour management of active medical problems listed below. Physiatrist and rehab team continue to assess barriers to discharge/monitor patient progress toward functional and medical goals.  Function:  Bathing Bathing position   Position: Shower  Bathing parts  Body parts bathed by patient: Right arm, Chest, Abdomen, Front perineal area, Right upper leg, Left upper leg, Left arm, Left lower leg, Right lower leg, Buttocks (lateral leans for buttocks) Body parts bathed by helper: Buttocks, Back  Bathing assist Assist Level: Supervision or verbal cues      Upper Body Dressing/Undressing Upper body dressing   What is the patient wearing?: Pull over shirt/dress     Pull over shirt/dress - Perfomed by patient: Thread/unthread right sleeve, Thread/unthread left sleeve, Put head through opening, Pull shirt over trunk          Upper body assist Assist Level: Supervision or verbal cues      Lower Body Dressing/Undressing Lower body dressing   What is the patient wearing?: Underwear, Pants, Non-skid slipper socks Underwear - Performed by patient: Thread/unthread right underwear leg, Thread/unthread left underwear leg Underwear - Performed by helper: Pull underwear up/down Pants- Performed by patient: Thread/unthread right pants leg, Thread/unthread left pants leg, Pull pants up/down Pants- Performed by helper: Fasten/unfasten pants Non-skid slipper socks- Performed by patient: Don/doff right sock, Don/doff left sock Non-skid slipper socks- Performed by helper: Don/doff right sock, Don/doff left sock                  Lower body assist Assist for lower body dressing: Touching or steadying assistance (Pt > 75%)      Toileting Toileting   Toileting steps completed by patient:  (No clothing) Toileting steps completed by helper: Performs perineal hygiene Toileting Assistive Devices: Grab bar or rail  Toileting assist Assist level: Touching or steadying assistance (Pt.75%)   Transfers Chair/bed transfer   Chair/bed  transfer method: Stand pivot Chair/bed transfer assist level: Touching or steadying assistance (Pt > 75%) Chair/bed transfer assistive device: Armrests, Patent attorneyWalker     Locomotion Ambulation     Max distance: 50 Assist level:  Touching or steadying assistance (Pt > 75%)   Wheelchair   Type: Manual Max wheelchair distance: 50 Assist Level: Touching or steadying assistance (Pt > 75%)  Cognition Comprehension Comprehension assist level: Understands basic 90% of the time/cues < 10% of the time  Expression Expression assist level: Expresses complex 90% of the time/cues < 10% of the time  Social Interaction Social Interaction assist level: Interacts appropriately 90% of the time - Needs monitoring or encouragement for participation or interaction.  Problem Solving Problem solving assist level: Solves complex 90% of the time/cues < 10% of the time  Memory Memory assist level: Recognizes or recalls 90% of the time/requires cueing < 10% of the time   Medical Problem List and Plan: 1.  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  -continue CIR  -impulsive at times 2.  DVT Prophylaxis/Anticoagulation: Pharmaceutical: Lovenox 3. Pain Management: tylenol prn.  4. Mood: LCSW to follow for evaluation and support.   -neuropsych eval appreciated  -on buspar and paxil currently  -initiated low dose bromocriptine for attention/arousal 5. Neuropsych: This patient is not fully capable of making decisions on her own behalf. 6. Right leg wound/Skin/Wound Care: wounds healing  7. Fluids/Electrolytes/Nutrition: encourage PO  -appreciate dietary consult  -supplementing potassium  -megace has helped  8. HTN: On lisinopril.   -NTG paste prn.  -controlled 1/14 9. Polysubstance abuse: has completed CIWA protocol. Counsel. 10. Constipation  Bowel reg increased on 1/13  Increased x1 on 1/14       LOS (Days) 11 A FACE TO FACE EVALUATION WAS PERFORMED  Aysel Gilchrest Karis JubaAnil Traevon Meiring, MD 10/07/2016 9:29 AM

## 2016-10-07 NOTE — Progress Notes (Signed)
Physical Therapy Note  Patient Details  Name: Kelsey Smith MRN: [id number] Date of Birth: 02-01-1961 Today's Date: 10/07/2016    Time: 1100-1130 30 minutes  1:1 No c/o pain.  Pt performed stair negotiation 12 steps x 2 with min A, cues to advance Rt UE, cues for sequencing and safety.  Pt fatigued on second attempt, required mod A to descend stairs safely.  Standing reach and squat task with focus on Rt LE control and Rt UE coordination with peg task. Pt min A for standing balance, frequent rests due to Rt LE fatigue this session.   Karri Kallenbach 10/07/2016, 11:29 AM

## 2016-10-08 ENCOUNTER — Inpatient Hospital Stay (HOSPITAL_COMMUNITY): Payer: Self-pay

## 2016-10-08 ENCOUNTER — Inpatient Hospital Stay (HOSPITAL_COMMUNITY): Payer: Medicaid Other | Admitting: Physical Therapy

## 2016-10-08 ENCOUNTER — Inpatient Hospital Stay (HOSPITAL_COMMUNITY): Payer: Medicaid Other | Admitting: Speech Pathology

## 2016-10-08 ENCOUNTER — Inpatient Hospital Stay (HOSPITAL_COMMUNITY): Payer: Medicaid Other | Admitting: Occupational Therapy

## 2016-10-08 MED ORDER — BROMOCRIPTINE MESYLATE 2.5 MG PO TABS
5.0000 mg | ORAL_TABLET | Freq: Two times a day (BID) | ORAL | Status: DC
  Administered 2016-10-08 – 2016-10-16 (×15): 5 mg via ORAL
  Filled 2016-10-08 (×18): qty 2

## 2016-10-08 NOTE — Progress Notes (Signed)
Occupational Therapy Session Note  Patient Details  Name: Kelsey Smith MRN: [id number] Date of Birth: [birth year]  Today's Date: 10/08/2016 OT Individual Time: 1115-1200 OT Individual Time Calculation (min): 45 min     Short Term Goals:Week 2:  OT Short Term Goal 1 (Week 2): Pt will demonstrate emergent awareness in functional context with min questioning cues OT Short Term Goal 2 (Week 2): Pt will perform toilet transfers with min A OT Short Term Goal 3 (Week 2): Pt will perform tub/shower transfers with min A OT Short Term Goal 4 (Week 2): Pt will perform toileting tasks with steady A  Skilled Therapeutic Interventions/Progress Updates:    Pt seen this session to work on dynamic balance and RLE coordination. Pt used RW to ambulate from recliner to w/c in hallway with steadying A and cues to use arms for pushing up vs pulling up.  In gym, transferred to mat and worked on dynamic reaching in sitting, RUE arm coordination and strength, and standing balance. Without UE support and steadying A from therapist, pt worked on sit to stands 10x for 2 sets, standing and wt shifting R ><L, standing and stepping L foot out and in without difficulty and then R foot out and in with increased time and some difficulty. Pt's limiting factor was standing fatigue.  At end of session pt needed to toilet. Used RW to transfer and completed toileting with steady A. Pt transferred to bed with bed alarm on and all needs met.  Therapy Documentation Precautions:  Precautions Precautions: Fall Restrictions Weight Bearing Restrictions: No    Vital Signs: Therapy Vitals Temp: 98.7 F (37.1 C) Temp Source: Oral Pulse Rate: 79 Resp: 17 BP: (!) 92/55 Patient Position (if appropriate): Lying Oxygen Therapy SpO2: 98 % O2 Device: Not Delivered Pain:  no c/o pain ADL: ADL ADL Comments: see functional navigator    See Function Navigator for Current Functional Status.   Therapy/Group: Individual  Therapy  Lyle 10/08/2016, 8:33 AM

## 2016-10-08 NOTE — Progress Notes (Signed)
Speech Language Pathology Daily Session Note  Patient Details  Name: [name] L Caine MRN: [id number] Date of Birth: Dec 10, 1960  Today's Date: 10/08/2016 SLP Individual Time: 1300-1400 SLP Individual Time Calculation (min): 60 min   Short Term Goals: Week 2: SLP Short Term Goal 1 (Week 2): Patient will recall new, daily information with supervision verbal cues.  SLP Short Term Goal 2 (Week 2): Patient will demonstrate complex problem solving for familiar tasks with supervision verbal cues.  SLP Short Term Goal 3 (Week 2): Patient will demonstrate selective attention to task in a moderately disctracting enviornment with supervision verbal cues for redirection.  SLP Short Term Goal 4 (Week 2): Patient will self-monitor and correct errors during functional tasks with supervision verbal cues.   Skilled Therapeutic Interventions: Skilled treatment session focused on cognitive goals. SLP facilitated session by providing supervision verbal cues for short-term recall during a novel task and for problem solving during a mildly complex scheduling task. Patient independently requested to use the bathroom and required supervision verbal cues for problem solving with task. Patient left supine in bed with all needs within reach. Continue with current plan of care.   Function:  Cognition Comprehension Comprehension assist level: Understands basic 90% of the time/cues < 10% of the time  Expression   Expression assist level: Expresses complex 90% of the time/cues < 10% of the time  Social Interaction Social Interaction assist level: Interacts appropriately 90% of the time - Needs monitoring or encouragement for participation or interaction.  Problem Solving Problem solving assist level: Solves complex 90% of the time/cues < 10% of the time  Memory Memory assist level: Recognizes or recalls 90% of the time/requires cueing < 10% of the time    Pain No/Denies Pain   Therapy/Group: Individual  Therapy  Anya Murphey 10/08/2016, 3:17 PM

## 2016-10-08 NOTE — Progress Notes (Signed)
Occupational Therapy Session Note  Patient Details  Name: Kelsey Smith MRN: [id number] Date of Birth: 03-23-1961  Today's Date: 10/08/2016 OT Individual Time: 0700-0757 OT Individual Time Calculation (min): 57 min     Short Term Goals: Week 2:  OT Short Term Goal 1 (Week 2): Pt will demonstrate emergent awareness in functional context with min questioning cues OT Short Term Goal 2 (Week 2): Pt will perform toilet transfers with min A OT Short Term Goal 3 (Week 2): Pt will perform tub/shower transfers with min A OT Short Term Goal 4 (Week 2): Pt will perform toileting tasks with steady A  Skilled Therapeutic Interventions/Progress Updates:    Pt resting in bed upon arrival.  Pt completed eating breakfast and amb with RW to bathroom (steady A) to use toilet and take shower.  Pt completed shower seated with lateral leans at supervision level.  Pt completed dressing tasks with sit<>stand from w/c at sink.  Pt requires steady A when standing to pull up pants. Pt incontinent of bladder with limited awareness.  Pt continues to exhibit impulsivity with transitional movements and requires mod verbal cues for safety awareness.  Focus on activity tolerance, standing balance, increased RUE use, and safety awareness to increase independence with BADLs. Therapy Documentation Precautions:  Precautions Precautions: Fall Restrictions Weight Bearing Restrictions: No General:   Vital Signs: Therapy Vitals Temp: 98.7 F (37.1 C) Temp Source: Oral Pulse Rate: 79 Resp: 17 BP: (!) 92/55 Patient Position (if appropriate): Lying Oxygen Therapy SpO2: 98 % O2 Device: Not Delivered Pain:   ADL: ADL ADL Comments: see functional navigator Exercises:   Other Treatments:    See Function Navigator for Current Functional Status.   Therapy/Group: Individual Therapy  Kelsey Smith, Kelsey Smith 10/08/2016, 8:00 AM

## 2016-10-08 NOTE — Progress Notes (Signed)
Occupational Therapy Note  Patient Details  Name: [name] L Farquhar MRN: [id number] Date of Birth: 1961-03-06  Today's Date: 10/08/2016 OT Individual Time: 1415-1445 OT Individual Time Calculation (min): 30 min    Pt denied pain Individual Therapy  Pt practiced bed mobility and tub transfers.  Pt has access to claw foot tub only.  Practiced stepping over into tub and using sear once in tub.  Pt currently requires min A stepping over into standard tub.  Will continue to address and practice home setup.  Educated pt on RW safety and issued a walker bag.  Practiced RW safety for home mgmt tasks. Pt returned to bed and remained in bed with all needs within reach and alarm activated.  Lavone NeriLanier, Langford Carias [facility] 10/08/2016, 3:03 PM

## 2016-10-08 NOTE — Progress Notes (Signed)
[facility] PHYSICAL MEDICINE & REHABILITATION     PROGRESS NOTE    Subjective/Complaints: No new issues. Had a large bm yesterday.   ROS: pt denies nausea, vomiting, diarrhea, cough, shortness of breath or chest pain   Objective: Vital Signs: Blood pressure 103/63, pulse 80, temperature 98.7 F (37.1 C), temperature source Oral, resp. rate 17, weight 52.7 kg (116 lb 1.6 oz), SpO2 98 %. No results found. No results for input(s): WBC, HGB, HCT, PLT in the last 72 hours. No results for input(s): NA, K, CL, GLUCOSE, BUN, CREATININE, CALCIUM in the last 72 hours.  Invalid input(s): CO CBG (last 3)  No results for input(s): GLUCAP in the last 72 hours.  Wt Readings from Last 3 Encounters:  10/05/16 52.7 kg (116 lb 1.6 oz)  09/24/16 47.1 kg (103 lb 13.4 oz)    Physical Exam:  Constitutional: She appears well-developed, well-nourished.  HENT: Normocephalic and atraumatic.  Eyes: EOMI. No discharge.  Cardiovascular: RRR. No JVD. Respiratory: clear bilaterally. Unlabored GI: Bowel sounds are normal. She exhibits no distension.  Musculoskeletal: She exhibits no edema or tenderness Neurological:  Left central 7 Motor: RUE 4+/5 proximal to distal. RUE with ataxia.  LUE 5/5 RLE 3/5 HF, 4/5 distally (stable).  LLE 4+ to 5/5. Skin: Skin is warm and dry. Ulcer medial right knee healing Psychiatric: Her affect is flat  Assessment/Plan: 1. Functional and mobility deficits secondary to left thalamic ICH which require 3+ hours per day of interdisciplinary therapy in a comprehensive inpatient rehab setting. Physiatrist is providing close team supervision and 24 hour management of active medical problems listed below. Physiatrist and rehab team continue to assess barriers to discharge/monitor patient progress toward functional and medical goals.  Function:  Bathing Bathing position   Position: Shower  Bathing parts Body parts bathed by patient: Right arm, Chest, Abdomen, Front  perineal area, Right upper leg, Left upper leg, Left arm, Left lower leg, Right lower leg, Buttocks Body parts bathed by helper: Buttocks, Back  Bathing assist Assist Level: Supervision or verbal cues      Upper Body Dressing/Undressing Upper body dressing   What is the patient wearing?: Pull over shirt/dress     Pull over shirt/dress - Perfomed by patient: Thread/unthread right sleeve, Thread/unthread left sleeve, Put head through opening, Pull shirt over trunk          Upper body assist Assist Level: Supervision or verbal cues      Lower Body Dressing/Undressing Lower body dressing   What is the patient wearing?: Underwear, Pants, Non-skid slipper socks Underwear - Performed by patient: Thread/unthread right underwear leg, Thread/unthread left underwear leg, Pull underwear up/down Underwear - Performed by helper: Pull underwear up/down Pants- Performed by patient: Thread/unthread right pants leg, Thread/unthread left pants leg, Pull pants up/down Pants- Performed by helper: Fasten/unfasten pants Non-skid slipper socks- Performed by patient: Don/doff right sock, Don/doff left sock Non-skid slipper socks- Performed by helper: Don/doff right sock, Don/doff left sock                  Lower body assist Assist for lower body dressing: Touching or steadying assistance (Pt > 75%)      Toileting Toileting   Toileting steps completed by patient: Adjust clothing prior to toileting, Performs perineal hygiene, Adjust clothing after toileting Toileting steps completed by helper: Performs perineal hygiene Toileting Assistive Devices: Grab bar or rail  Toileting assist Assist level: Supervision or verbal cues   Transfers Chair/bed transfer   Chair/bed transfer method: Stand  pivot Chair/bed transfer assist level: Touching or steadying assistance (Pt > 75%) Chair/bed transfer assistive device: Armrests, Patent attorney     Max distance: 50 Assist level:  Touching or steadying assistance (Pt > 75%)   Wheelchair   Type: Manual Max wheelchair distance: 50 Assist Level: Touching or steadying assistance (Pt > 75%)  Cognition Comprehension Comprehension assist level: Understands basic 90% of the time/cues < 10% of the time  Expression Expression assist level: Expresses complex 90% of the time/cues < 10% of the time  Social Interaction Social Interaction assist level: Interacts appropriately 90% of the time - Needs monitoring or encouragement for participation or interaction.  Problem Solving Problem solving assist level: Solves complex 90% of the time/cues < 10% of the time  Memory Memory assist level: Recognizes or recalls 90% of the time/requires cueing < 10% of the time   Medical Problem List and Plan: 1.  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  -continue CIR, PT-OT-SLP 2.  DVT Prophylaxis/Anticoagulation: Pharmaceutical: Lovenox 3. Pain Management: tylenol prn.  4. Mood: LCSW to follow for evaluation and support.   -neuropsych eval appreciated  -on buspar and paxil currently  -continue  bromocriptine for attention/arousal--increase to 5mg  5. Neuropsych: This patient is not fully capable of making decisions on her own behalf. 6. Right leg wound/Skin/Wound Care: wounds healing  7. Fluids/Electrolytes/Nutrition: eating better  -appreciate dietary consult  -supplementing potassium  -continue megace    8. HTN: On lisinopril.   -NTG paste prn.  -controlled 1/14 9. Polysubstance abuse: has completed CIWA protocol. Counsel. 10. Constipation  Bowel reg increased on 1/13  Increased x1 on 1/14       LOS (Days) 12 A FACE TO FACE EVALUATION WAS PERFORMED  Ranelle Oyster, MD 10/08/2016 9:31 AM

## 2016-10-08 NOTE — Progress Notes (Signed)
Physical Therapy Session Note  Patient Details  Name: Kelsey Smith MRN: [id number] Date of Birth: September 02, 1961  Today's Date: 10/08/2016 PT Individual Time: 0900-1000 PT Individual Time Calculation (min): 60 min    Short Term Goals: Week 2:  PT Short Term Goal 1 (Week 2): Pt will perform gait x 50' in controlled environment with supervision PT Short Term Goal 2 (Week 2): Pt will demo dynamic balance for functional task with min A  Skilled Therapeutic Interventions/Progress Updates:    Pt received in w/c applying make up & agreeable to tx. Pt denied c/o pain. Session focused on R neuro re-ed, gait training, transfers, pt education, strengthening & standing balance. Pt propelled w/c room>gym with BLE, supervision, and extra time with task focusing on RLE neuro re-ed and coordination of reciprocal movements. Gait training x 100 ft + 100 ft  + 50 ft with RW & min assist. Pt requires cuing for increased dorsiflexion and foot clearance RLE with fair/good demo by pt. Educated pt on safety with transfers with pt return demonstrating with max cuing. Pt reported incontinent void during ambulation & transported back to room where pt stood with min assist to change brief & perform peri-hygiene. Pt utilized cybex kinetron in sitting at 70 cm/sec with focus on attention to task, BLE strengthening and RLE neuro re-ed (3 bouts x 1 minute each). At end of session pt left in bed with alarm set & all needs within reach.   Therapy Documentation Precautions:  Precautions Precautions: Fall Restrictions Weight Bearing Restrictions: No  See Function Navigator for Current Functional Status.   Therapy/Group: Individual Therapy  Kelsey Smith 10/08/2016, 12:19 PM

## 2016-10-09 ENCOUNTER — Inpatient Hospital Stay (HOSPITAL_COMMUNITY): Payer: Self-pay

## 2016-10-09 ENCOUNTER — Inpatient Hospital Stay (HOSPITAL_COMMUNITY): Payer: Self-pay | Admitting: Physical Therapy

## 2016-10-09 ENCOUNTER — Inpatient Hospital Stay (HOSPITAL_COMMUNITY): Payer: Medicaid Other | Admitting: Speech Pathology

## 2016-10-09 MED ORDER — ENSURE ENLIVE PO LIQD: 237.0000 mL | Freq: Two times a day (BID) | ORAL | Status: DC

## 2016-10-09 NOTE — Progress Notes (Addendum)
[facility] PHYSICAL MEDICINE & REHABILITATION     PROGRESS NOTE    Subjective/Complaints: Up in bed. Very pleased with progress. Has appetite. "numbness is going away in my right arm"  ROS: pt denies nausea, vomiting, diarrhea, cough, shortness of breath or chest pain    Objective: Vital Signs: Blood pressure 102/66, pulse 70, temperature 98 F (36.7 C), temperature source Oral, resp. rate 18, weight 52.7 kg (116 lb 1.6 oz), SpO2 99 %. No results found. No results for input(s): WBC, HGB, HCT, PLT in the last 72 hours. No results for input(s): NA, K, CL, GLUCOSE, BUN, CREATININE, CALCIUM in the last 72 hours.  Invalid input(s): CO CBG (last 3)  No results for input(s): GLUCAP in the last 72 hours.  Wt Readings from Last 3 Encounters:  10/05/16 52.7 kg (116 lb 1.6 oz)  09/24/16 47.1 kg (103 lb 13.4 oz)    Physical Exam:  Constitutional: She appears well-developed, well-nourished.  HENT: Normocephalic and atraumatic.  Eyes: EOMI. No discharge.  Cardiovascular: RRR. Respiratory: clear GI: Bowel sounds are normal. She exhibits no distension.  Musculoskeletal: She exhibits no edema or tenderness Neurological:  Left central 7 resolving. Minimal right sided sensory loss Motor: RUE 4+/5 proximal to distal. RUE with less ataxia.  LUE 5/5 RLE 4-/5 HF, 4/5 distally    LLE 4+ to 5/5. Skin: Skin is warm and dry. Ulcer medial right knee healing Psychiatric: Her affect is flat  Assessment/Plan: 1. Functional and mobility deficits secondary to left thalamic ICH which require 3+ hours per day of interdisciplinary therapy in a comprehensive inpatient rehab setting. Physiatrist is providing close team supervision and 24 hour management of active medical problems listed below. Physiatrist and rehab team continue to assess barriers to discharge/monitor patient progress toward functional and medical goals.  Function:  Bathing Bathing position   Position: Shower  Bathing parts Body  parts bathed by patient: Right arm, Chest, Abdomen, Front perineal area, Right upper leg, Left upper leg, Left arm, Left lower leg, Right lower leg, Buttocks Body parts bathed by helper: Buttocks, Back  Bathing assist Assist Level: Supervision or verbal cues      Upper Body Dressing/Undressing Upper body dressing   What is the patient wearing?: Pull over shirt/dress     Pull over shirt/dress - Perfomed by patient: Thread/unthread right sleeve, Thread/unthread left sleeve, Put head through opening, Pull shirt over trunk          Upper body assist Assist Level: Supervision or verbal cues      Lower Body Dressing/Undressing Lower body dressing   What is the patient wearing?: Underwear, Pants, Non-skid slipper socks Underwear - Performed by patient: Thread/unthread right underwear leg, Thread/unthread left underwear leg, Pull underwear up/down Underwear - Performed by helper: Pull underwear up/down Pants- Performed by patient: Thread/unthread right pants leg, Thread/unthread left pants leg, Pull pants up/down Pants- Performed by helper: Fasten/unfasten pants Non-skid slipper socks- Performed by patient: Don/doff right sock, Don/doff left sock Non-skid slipper socks- Performed by helper: Don/doff right sock, Don/doff left sock                  Lower body assist Assist for lower body dressing: Touching or steadying assistance (Pt > 75%)      Toileting Toileting   Toileting steps completed by patient: Adjust clothing prior to toileting, Performs perineal hygiene, Adjust clothing after toileting Toileting steps completed by helper: Adjust clothing prior to toileting Toileting Assistive Devices: Grab bar or rail  Toileting assist Assist level:  Touching or steadying assistance (Pt.75%)   Transfers Chair/bed transfer   Chair/bed transfer method: Stand pivot Chair/bed transfer assist level: Touching or steadying assistance (Pt > 75%) Chair/bed transfer assistive device: Armrests,  Patent attorneyWalker     Locomotion Ambulation     Max distance: 100 ft Assist level: Touching or steadying assistance (Pt > 75%)   Wheelchair   Type: Manual Max wheelchair distance: 150 ft (BLE) Assist Level: Supervision or verbal cues  Cognition Comprehension Comprehension assist level: Follows basic conversation/direction with no assist  Expression Expression assist level: Expresses basic 75 - 89% of the time/requires cueing 10 - 24% of the time. Needs helper to occlude trach/needs to repeat words.  Social Interaction Social Interaction assist level: Interacts appropriately 75 - 89% of the time - Needs redirection for appropriate language or to initiate interaction.  Problem Solving Problem solving assist level: Solves basic 75 - 89% of the time/requires cueing 10 - 24% of the time  Memory Memory assist level: Recognizes or recalls 75 - 89% of the time/requires cueing 10 - 24% of the time   Medical Problem List and Plan: 1.  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  -continue CIR, PT-OT-SLP 2.  DVT Prophylaxis/Anticoagulation: Pharmaceutical: Lovenox 3. Pain Management: tylenol prn.  4. Mood: LCSW to follow for evaluation and support.   -neuropsych eval appreciated  -on buspar and paxil currently  -continue  bromocriptine for attention/arousal at 5mg  bid--has had a positive response 5. Neuropsych: This patient is not fully capable of making decisions on her own behalf. 6. Right leg wound/Skin/Wound Care: wounds healing  7. Fluids/Electrolytes/Nutrition: eating better, pt happy with this  -appreciate dietary consult  -supplementing potassium  -will stop megace and observe   8. HTN: On lisinopril.   -NTG paste prn.  -controlled 1/14 9. Polysubstance abuse: has completed CIWA protocol. Counsel. 10. Constipation  Bowel reg increased on 1/13  Bowel movement x1 on 1/14       LOS (Days) 13 A FACE TO FACE EVALUATION WAS  PERFORMED  Ranelle OysterSWARTZ,Raniah Karan T, MD 10/09/2016 9:14 AM

## 2016-10-09 NOTE — Progress Notes (Signed)
Recreational Therapy Assessment and Plan  Patient Details  Name: Kelsey Smith MRN: [id number] Date of Birth: 07/27/1961 Today's Date: 10/09/2016  Rehab Potential: Good ELOS: discharge 1/25   Assessment Clinical Impression:  Problem List: Patient Active Problem List   Diagnosis Date Noted  . IVH (intraventricular hemorrhage) ([place]) 09/26/2016  . B12 deficiency 09/26/2016  . Hypertensive emergency 09/26/2016  . Essential hypertension 09/26/2016  . Carotid aneurysm, left ([place]) 09/26/2016  . Open thigh wound 09/26/2016  . Somnolence   . Recurrent major depressive disorder ([place])   . ETOH abuse   . Tobacco abuse   . Cocaine abuse   . Pure hypercholesterolemia   . Intraparenchymal hemorrhage of brain ([place]) - L thalamic 09/23/2016    Past Medical History:      Past Medical History:  Diagnosis Date  . Anxiety   . Depression    Past Surgical History: No past surgical history on file.  Assessment & Plan Clinical Impression: Patient is a 56 y.o. year old female with recent admission to the hospital on 09/23/16 with complaints of HA, right sided weakness and difficulty speaking. Patient had been drinking heavily the night before, sustained a fall and was helped back in bed but found on the floor next morning not acting normally. UDS positive for cocaine. 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 She was started on CIWA protocol and bleed felt to be due to HTN in setting of heavy alcohol use. MRI/MRA brain done revealing left thalamic hemorrhage with regional mass effect/edema, mild to moderate age advanced white matter disease, MRA negative for left thalamic hemorrhage and 5 mm L-ICA aneurysm. 2 D echo done today--results pending. WOC consulted for input on old full thickness right thigh injury and recommended Aquacel to absorb drainage. Patient with resultant HA, aphasia, poor attention, difficulty  walking due to right sided weakness..  Patient transferred to CIR on 09/26/2016.   Pt presents with decreased activity tolerance, decreased functional mobility, decreased balance, decreased coordination, decreased attention, decreased awareness, decreased problem solving, decreased safety awareness, decreased memory and delayed processing Limiting pt's independence with leisure/community pursuits.   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 Other Leisure Interests: Cooking/Baking Leisure Participation Style: With Family/Friends Awareness of Community Resources: Fair-identify 2 post discharge leisure resources Psychosocial / Spiritual Patient agreeable to Pet Therapy: Yes Does patient have pets?: Yes (cat) Social interaction - Mood/Behavior: Cooperative Engineer, drilling for Education?: Yes Strengths/Weaknesses Patient Strengths/Abilities: Willingness to participate;Active premorbidly  Plan Rec Therapy Plan Is patient appropriate for Therapeutic Recreation?: Yes Rehab Potential: Good Treatment times per week: MIn 1 TR session/group for >30 minutes during LOS Estimated Length of Stay: discharge 1/25 TR Treatment/Interventions: 1:1 session;Balance/vestibular training;Functional mobility training;Community reintegration;Patient/family education;Recreation/leisure participation;Therapeutic activities;Leisure education;UE/LE Coordination activities;Therapeutic exercise;Cognitive remediation/compensation  Recommendations for other services: None   Discharge Criteria: Patient will be discharged from TR if patient refuses treatment 3 consecutive times without medical reason.  If treatment goals not met, if there is a change in medical status, if patient makes no progress towards goals or if patient is discharged from hospital.  The above assessment, treatment plan, treatment alternatives  and goals were discussed and mutually agreed upon: by patient  [place] 10/09/2016, 10:24 AM

## 2016-10-09 NOTE — Progress Notes (Signed)
Nutrition Follow-up  DOCUMENTATION CODES:   Not applicable  INTERVENTION:  Provide Ensure Enlive po BID, each supplement provides 350 kcal and 20 grams of protein.  Encourage adequate PO intake.   NUTRITION DIAGNOSIS:   Increased nutrient needs related to  (therapy) as evidenced by estimated needs; ongoing  GOAL:   Patient will meet greater than or equal to 90% of their needs; met  MONITOR:   PO intake, Supplement acceptance, Labs, Weight trends, Skin, I & O's  REASON FOR ASSESSMENT:   Consult Assessment of nutrition requirement/status  ASSESSMENT:   56 y.o. female who was admitted on 09/23/16 with complaints of  HA, right sided weakness and difficulty speaking.  Patient had been drinking heavily the night before, sustained a fall and was helped back in bed but found on the floor next morning not acting normally. UDS positive for cocaine. 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 She was started on CIWA protocol and bleed felt to be due to HTN in setting of heavy alcohol use. MRI/MRA brain done revealing left thalamic hemorrhage with regional mass effect/edema, mild to moderate age advanced white matter disease, MRA negative for left thalamic hemorrhage and 5 mm L-ICA aneurysm.  Appetite has improved and pt has been eating better at meals with meal completion 85-100%. Pt currently has Ensure ordered and has been consuming them. RD to modify orders down to BID as intake has improved. Pt encouraged to eat her foods at meals and to drink her supplements.   Diet Order:  Diet Heart Room service appropriate? Yes; Fluid consistency: Thin  Skin:   (Non-pressure wound on R knee)  Last BM:  1/14  Height:   Ht Readings from Last 1 Encounters:  09/23/16 '5\' 1"'  (1.549 m)    Weight:   Wt Readings from Last 1 Encounters:  10/05/16 116 lb 1.6 oz (52.7 kg)    Ideal Body Weight:  47.7 kg  BMI:  Body mass index is  21.94 kg/m.  Estimated Nutritional Needs:   Kcal:  1500-1800  Protein:  60-70 grams  Fluid:  >/= 1.5 L/day  EDUCATION NEEDS:   No education needs identified at this time  Corrin Parker, MS, RD, LDN Pager # [phone] After hours/ weekend pager # [phone]

## 2016-10-09 NOTE — Progress Notes (Signed)
Occupational Therapy Session Note  Patient Details  Name: Kelsey Smith MRN: [id number] Date of Birth: [birth year]  Today's Date: 10/09/2016 OT Individual Time: 0700-0756 OT Individual Time Calculation (min): 56 min     Short Term Goals: Week 2:  OT Short Term Goal 1 (Week 2): Pt will demonstrate emergent awareness in functional context with min questioning cues OT Short Term Goal 2 (Week 2): Pt will perform toilet transfers with min A OT Short Term Goal 3 (Week 2): Pt will perform tub/shower transfers with min A OT Short Term Goal 4 (Week 2): Pt will perform toileting tasks with steady A  Skilled Therapeutic Interventions/Progress Updates:    Pt engaged in BADL retraining including bathing at shower level and dressing with sit<>stand from w/c at sink.  Pt requires steady A for standing balance when pulling up pants and during toileting tasks.  Pt continues to exhibit impulsive behaviors and requires max verbal cues for safety awareness.  Pt amb with RW in room to gather supplies with steady A and max verbal cues for safety awareness.  Focus on standing balance, transitional movements, safety awareness, and activity tolerance to increase independence with BADLs.  Therapy Documentation Precautions:  Precautions Precautions: Fall Restrictions Weight Bearing Restrictions: No General:   Vital Signs: Therapy Vitals Temp: 98 F (36.7 C) Temp Source: Oral Pulse Rate: 70 Resp: 18 BP: 102/66 Patient Position (if appropriate): Lying Oxygen Therapy SpO2: 99 % O2 Device: Not Delivered Pain:   ADL: ADL ADL Comments: see functional navigator Exercises:   Other Treatments:    See Function Navigator for Current Functional Status.   Therapy/Group: Individual Therapy  Rich BraveLanier, Gilberte Gorley Chappell 10/09/2016, 7:57 AM

## 2016-10-09 NOTE — Progress Notes (Signed)
Speech Language Pathology Daily Session Note  Patient Details  Name: [name] L Lonon MRN: [id number] Date of Birth: [birth year]  Today's Date: 10/09/2016 SLP Individual Time: 0830-0930 SLP Individual Time Calculation (min): 60 min   Short Term Goals: Week 2: SLP Short Term Goal 1 (Week 2): Patient will recall new, daily information with supervision verbal cues.  SLP Short Term Goal 2 (Week 2): Patient will demonstrate complex problem solving for familiar tasks with supervision verbal cues.  SLP Short Term Goal 3 (Week 2): Patient will demonstrate selective attention to task in a moderately disctracting enviornment with supervision verbal cues for redirection.  SLP Short Term Goal 4 (Week 2): Patient will self-monitor and correct errors during functional tasks with supervision verbal cues.   Skilled Therapeutic Interventions: Skilled treatment session focused on cognitive goals. Patient independently requested to use the bathroom and performed self-care tasks with supervision. SLP facilitated session by providing education in regards to memory compensatory strategies and how to incorporate them into her daily life. Patient verbalized understanding and recorded everything she did in previous therapy sessions on her daily schedule with Mod I. Patient transferred to recliner with chair alarm in place. Continue with current plan of care.   Function:  Cognition Comprehension Comprehension assist level: Follows basic conversation/direction with no assist  Expression   Expression assist level: Expresses basic 75 - 89% of the time/requires cueing 10 - 24% of the time. Needs helper to occlude trach/needs to repeat words.  Social Interaction Social Interaction assist level: Interacts appropriately 75 - 89% of the time - Needs redirection for appropriate language or to initiate interaction.  Problem Solving Problem solving assist level: Solves basic 75 - 89% of the time/requires cueing 10 - 24% of the  time  Memory Memory assist level: Recognizes or recalls 75 - 89% of the time/requires cueing 10 - 24% of the time    Pain No/Denies Pain   Therapy/Group: Individual Therapy  Lyden Redner 10/09/2016, 2:19 PM

## 2016-10-09 NOTE — Progress Notes (Signed)
Occupational Therapy Note  Patient Details  Name: [name] L Ganci MRN: [id number] Date of Birth: 09-21-1961  Today's Date: 10/09/2016 OT Individual Time: 1330-1400 OT Individual Time Calculation (min): 30 min    PT denied pain Individual therapy  Pt resting in bed upon arrival.  Pt transitioned to ADL apartment and practiced stepping over into tub.  Demonstrated tub bench transfers and informed pt that equipment would not work with claw foot tub.  Pt stated she might try to stay with someone else because she didn't have heat where she lived.  CSW informed.  Pt educated on RW safety in kitchen.  Pt continues to require max verbal cues for RW safety.  Pt transitioned to therapy gym and practiced picking items up from floor.  Pt performed all tasks with steady A.  Pt returned to room and remained in bed with all needs within reach and bed alarm activated.   Lavone NeriLanier, Maleni Seyer [facility] 10/09/2016, 2:05 PM

## 2016-10-09 NOTE — Progress Notes (Signed)
Physical Therapy Note  Patient Details  Name: Kelsey Smith MRN: [id number] Date of Birth: 07/20/1961 Today's Date: 10/09/2016    Time: 1000-1042 42 minutes  1:1 No c/o pain.  Pt performed w/c and toilet transfers with supervision, cues for safety awareness.  W/c with bilat LEs for LE strength and coordination 25' x 3, rest breaks due to fatigue.  Gait with RW 2 x 100', 200' with close supervision, pt able to carry on conversation with rec therapist while performing gait with supervision cues for Rt foot drag when fatigued.   Standing balance with ball toss and kick with min A for balance due to posterior lean.  Gait without AD with ball toss for forward and backward gait with min A. Pt requires rests due to fatigue but much improved balance and independence with gait and transfers.    Burma Ketcher 10/09/2016, 10:43 AM

## 2016-10-10 ENCOUNTER — Encounter (HOSPITAL_COMMUNITY): Payer: Self-pay | Admitting: *Deleted

## 2016-10-10 ENCOUNTER — Inpatient Hospital Stay (HOSPITAL_COMMUNITY): Payer: Medicaid Other | Admitting: Speech Pathology

## 2016-10-10 ENCOUNTER — Inpatient Hospital Stay (HOSPITAL_COMMUNITY): Payer: Self-pay

## 2016-10-10 ENCOUNTER — Inpatient Hospital Stay (HOSPITAL_COMMUNITY): Payer: Medicaid Other | Admitting: Physical Therapy

## 2016-10-10 NOTE — Progress Notes (Signed)
Recreational Therapy Session Note  Patient Details  Name: Antonette L Abid MRN: [id number] Date of Birth: 10/04/1960 Today's Date: 10/10/2016  Pain: no c/o Pt referred to TR for participation in a kitchen safety/meal prep group.    Group goals:  >Pt will actively participate in 45 minute kitchen task standing/ambultory level with min assist. >Pt will identify >3 safety concerns during simple prep activity with min cues. >After discussion, pt will identify opportunities for energy conservation during simple      meal prep tasks with min cues.  Pt participated in kitchen safety/meal prep group at overall contact guard assist ambulatory level for 60 minute group.. Pt required min cuing to identify safety concerns and energy conservation techniques.  Education provided on energy conservation techniques and kitchen safety/home modifications. Goals met  SIMPSON,LISA 10/10/2016, 12:44 PM  

## 2016-10-10 NOTE — Progress Notes (Signed)
Occupational Therapy Note  Patient Details  Name: Kelsey Smith MRN: [id number] Date of Birth: July 30, 1961  Today's Date: 10/10/2016 OT Concurrent Time: 1100-1200 OT Concurrent Time Calculation (min): 60 min   Pt denied pain Individual Therapy  Pt participated in concurrent therapy session to complete kitchen tasks. Pt assisted with preparing spaghetti including chopping onions and green peppers.  Pt also assisted with browning ground [place].  Pt initially performed tasks while standing at counter but requested a chair to complete tasks 2/2 RLE fatigue.  Pt required min verbal cues for safety awareness when using oven top.  Focus on standing balance, functional amb with RW to retrieve items from refrigerator, and safety awareness in kitchen.  Cotreatment with Recreational therapist.    Kelsey Smith, Kelsey Smith 10/10/2016, 12:14 PM

## 2016-10-10 NOTE — Progress Notes (Signed)
Speech Language Pathology Daily Session Note  Patient Details  Name: Kelsey Smith MRN: [id number] Date of Birth: August 23, 1961  Today's Date: 10/10/2016 SLP Individual Time: 0835-0900 SLP Individual Time Calculation (min): 25 min   Short Term Goals: Week 2: SLP Short Term Goal 1 (Week 2): Patient will recall new, daily information with supervision verbal cues.  SLP Short Term Goal 2 (Week 2): Patient will demonstrate complex problem solving for familiar tasks with supervision verbal cues.  SLP Short Term Goal 3 (Week 2): Patient will demonstrate selective attention to task in a moderately disctracting enviornment with supervision verbal cues for redirection.  SLP Short Term Goal 4 (Week 2): Patient will self-monitor and correct errors during functional tasks with supervision verbal cues.   Skilled Therapeutic Interventions:   Skilled treatment session focused on addressing cognition goals.  SLP facilitated session by providing Max assist question cues for patient to recall yesterday's session and task that she was supposed to implement prior to today.  SLP facilitated session with Mod assist question cues to problem solve solutions since her journal is not here and then SLP set-up it up.  Patient required Min assist question cues to provide enough information in notes for delayed recall.  Continue with current plan of care.    Function:  Cognition Comprehension Comprehension assist level: Follows basic conversation/direction with no assist  Expression   Expression assist level: Expresses basic 75 - 89% of the time/requires cueing 10 - 24% of the time. Needs helper to occlude trach/needs to repeat words.  Social Interaction Social Interaction assist level: Interacts appropriately 75 - 89% of the time - Needs redirection for appropriate language or to initiate interaction.  Problem Solving Problem solving assist level: Solves basic 75 - 89% of the time/requires cueing 10 - 24% of the time   Memory Memory assist level: Recognizes or recalls 75 - 89% of the time/requires cueing 10 - 24% of the time    Pain Pain Assessment Pain Assessment: No/denies pain  Therapy/Group: Individual Therapy  Charlane FerrettiMelissa Trecia Maring, M.A., CCC-SLP [phone]  [name] 10/10/2016, 9:36 AM

## 2016-10-10 NOTE — Progress Notes (Signed)
Occupational Therapy Session Note  Patient Details  Name: Kelsey Smith MRN: [id number] Date of Birth: 23-Jan-1961  Today's Date: 10/10/2016 OT Individual Time: 0700-0757 OT Individual Time Calculation (min): 57 min     Short Term Goals: Week 2:  OT Short Term Goal 1 (Week 2): Pt will demonstrate emergent awareness in functional context with min questioning cues OT Short Term Goal 2 (Week 2): Pt will perform toilet transfers with min A OT Short Term Goal 3 (Week 2): Pt will perform tub/shower transfers with min A OT Short Term Goal 4 (Week 2): Pt will perform toileting tasks with steady A  Skilled Therapeutic Interventions/Progress Updates:    Pt engaged in BADL retraining including bathing at shower level, dressing with sit<>stand from w/c at sink, and grooming while standing at sink.  Pt continues to require mod verbal cues for safety during ambulation and with functional transfers.  Pt continues to incorporate RUE into all functional tasks. Focus on standing balance, functional transfers, functional amb with RW, and safety awareness to increase independence with BADLs.  Therapy Documentation Precautions:  Precautions Precautions: Fall Restrictions Weight Bearing Restrictions: No Pain:  Pt denied pain  See Function Navigator for Current Functional Status.   Therapy/Group: Individual Therapy  Rich BraveLanier, Montravious Weigelt Chappell 10/10/2016, 7:58 AM

## 2016-10-10 NOTE — Progress Notes (Signed)
[facility] PHYSICAL MEDICINE & REHABILITATION     PROGRESS NOTE    Subjective/Complaints: No new problems. Up with therapy in gym  ROS: pt denies nausea, vomiting, diarrhea, cough, shortness of breath or chest pain     Objective: Vital Signs: Blood pressure (!) 96/57, pulse 74, temperature 98.3 F (36.8 C), temperature source Oral, resp. rate 18, weight 53.5 kg (118 lb), SpO2 97 %. No results found. No results for input(s): WBC, HGB, HCT, PLT in the last 72 hours. No results for input(s): NA, K, CL, GLUCOSE, BUN, CREATININE, CALCIUM in the last 72 hours.  Invalid input(s): CO CBG (last 3)  No results for input(s): GLUCAP in the last 72 hours.  Wt Readings from Last 3 Encounters:  10/10/16 53.5 kg (118 lb)  09/24/16 47.1 kg (103 lb 13.4 oz)    Physical Exam:  Constitutional: She appears well-developed, well-nourished.  HENT: Normocephalic and atraumatic.  Eyes: EOMI. No discharge.  Cardiovascular: RRR. Respiratory: clear GI: Bowel sounds are normal. She exhibits no distension.  Musculoskeletal: She exhibits no edema or tenderness Neurological:  Left central 7 resolving. Minimal right sided sensory loss Motor: RUE 4+/5 proximal to distal. RUE still with mild ataxia LUE 5/5 RLE 4-/5 HF, 4/5 distally--stable   LLE 4+ to 5/5. Skin: Skin is warm and dry. Ulcer medial right knee healing Psychiatric: Her affect is flat  Assessment/Plan: 1. Functional and mobility deficits secondary to left thalamic ICH which require 3+ hours per day of interdisciplinary therapy in a comprehensive inpatient rehab setting. Physiatrist is providing close team supervision and 24 hour management of active medical problems listed below. Physiatrist and rehab team continue to assess barriers to discharge/monitor patient progress toward functional and medical goals.  Function:  Bathing Bathing position   Position: Shower  Bathing parts Body parts bathed by patient: Right arm, Chest, Abdomen,  Front perineal area, Right upper leg, Left upper leg, Left arm, Left lower leg, Right lower leg, Buttocks Body parts bathed by helper: Buttocks, Back  Bathing assist Assist Level: Supervision or verbal cues      Upper Body Dressing/Undressing Upper body dressing   What is the patient wearing?: Pull over shirt/dress     Pull over shirt/dress - Perfomed by patient: Thread/unthread right sleeve, Thread/unthread left sleeve, Put head through opening, Pull shirt over trunk          Upper body assist Assist Level: Supervision or verbal cues      Lower Body Dressing/Undressing Lower body dressing   What is the patient wearing?: Underwear, Pants, Non-skid slipper socks Underwear - Performed by patient: Thread/unthread right underwear leg, Thread/unthread left underwear leg, Pull underwear up/down Underwear - Performed by helper: Pull underwear up/down Pants- Performed by patient: Thread/unthread right pants leg, Thread/unthread left pants leg, Pull pants up/down Pants- Performed by helper: Fasten/unfasten pants Non-skid slipper socks- Performed by patient: Don/doff right sock, Don/doff left sock Non-skid slipper socks- Performed by helper: Don/doff right sock, Don/doff left sock                  Lower body assist Assist for lower body dressing: Touching or steadying assistance (Pt > 75%)      Toileting Toileting   Toileting steps completed by patient: Adjust clothing prior to toileting, Performs perineal hygiene, Adjust clothing after toileting Toileting steps completed by helper: Adjust clothing prior to toileting Toileting Assistive Devices: Grab bar or rail  Toileting assist Assist level: Touching or steadying assistance (Pt.75%)   Transfers Chair/bed transfer  Chair/bed transfer method: Stand pivot Chair/bed transfer assist level: Touching or steadying assistance (Pt > 75%) Chair/bed transfer assistive device: Armrests, Patent attorneyWalker     Locomotion Ambulation     Max  distance: 100 ft Assist level: Touching or steadying assistance (Pt > 75%)   Wheelchair   Type: Manual Max wheelchair distance: 150 ft (BLE) Assist Level: Supervision or verbal cues  Cognition Comprehension Comprehension assist level: Follows basic conversation/direction with no assist  Expression Expression assist level: Expresses basic 75 - 89% of the time/requires cueing 10 - 24% of the time. Needs helper to occlude trach/needs to repeat words.  Social Interaction Social Interaction assist level: Interacts appropriately 75 - 89% of the time - Needs redirection for appropriate language or to initiate interaction.  Problem Solving Problem solving assist level: Solves basic 75 - 89% of the time/requires cueing 10 - 24% of the time  Memory Memory assist level: Recognizes or recalls 75 - 89% of the time/requires cueing 10 - 24% of the time   Medical Problem List and Plan: 1.  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  -continue CIR, PT-OT-SLP  -continued issues with safety awareness 2.  DVT Prophylaxis/Anticoagulation: Pharmaceutical: Lovenox 3. Pain Management: tylenol prn.  4. Mood: LCSW to follow for evaluation and support.   -neuropsych eval appreciated  -on buspar and paxil currently  -continue  bromocriptine for attention/arousal at 5mg  bid-  5. Neuropsych: This patient is not fully capable of making decisions on her own behalf. 6. Right leg wound/Skin/Wound Care: wounds healing  7. Fluids/Electrolytes/Nutrition: eating better, pt happy with this  -appreciate dietary consult  -supplementing potassium  -have stopped megace---observe for intake   8. HTN: On lisinopril.   -NTG paste prn.  -controlled 1/14 9. Polysubstance abuse: has completed CIWA protocol. Counsel. 10. Constipation  Bowel reg increased on 1/13  Bowel movement x1 on 1/14       LOS (Days) 14 A FACE TO FACE EVALUATION WAS  PERFORMED  Ranelle OysterSWARTZ,Dawayne Ohair T, MD 10/10/2016 11:21 AM

## 2016-10-10 NOTE — Progress Notes (Addendum)
Physical Therapy Session Note  Patient Details  Name: Kelsey Smith MRN: [id number] Date of Birth: 1961-09-11  Today's Date: 10/10/2016 PT Concurrent Time: 0900-1000 PT Concurrent Time Calculation (min): 60 min   Short Term Goals: Week 2:  PT Short Term Goal 1 (Week 2): Pt will perform gait x 50' in controlled environment with supervision PT Short Term Goal 2 (Week 2): Pt will demo dynamic balance for functional task with min A  Skilled Therapeutic Interventions/Progress Updates:  Pt participated in skilled concurrent PT treatment.  Pt received in w/c & agreeable to tx, denying c/o pain. Session focused on pt education, high level balance training, gait training, RLE/UE neuro re-ed, and sustained attention. Pt utilized cybex kinetron in sitting for RLE strengthening & neuro re-ed with improved attention to task (pt able to demonstrate sustained attention ~1-2 minutes at a time with supervision). Gait training up to 100 ft without AD with min assist and therapist providing manual facilitation for weight shifting and cuing for increased toe clearance RLE with good demo by pt. Pt ambulated backwards with min assist for balance but decreased weight shifting and foot clearance bilaterally (R>LLE). Pt negotiated 12 steps (6") with B rails and min assist with task focusing on RLE strengthening & neuromuscular control as well as awareness of RUE (pt required min cuing). Pt then retrieved small beans from pink thera-putty with RUE with task focusing on fine motor use of extremity. Educated pt on CVA recovery, risk factors for another CVA and lifestyle modifications to make to reduce risks (no smoking or drinking, heart healthy diet). At end of session pt left sitting in w/c in room with QRB donned & all needs within reach.   Therapy Documentation Precautions: Precautions Precautions: Fall Restrictions Weight Bearing Restrictions: No   See Function Navigator for Current Functional  Status.   Therapy/Group: Individual Therapy  Sandi MariscalVictoria M Isabella Smith 10/10/2016, 5:59 PM

## 2016-10-11 ENCOUNTER — Ambulatory Visit (HOSPITAL_COMMUNITY): Payer: Self-pay | Admitting: Physical Therapy

## 2016-10-11 ENCOUNTER — Inpatient Hospital Stay (HOSPITAL_COMMUNITY): Payer: Medicaid Other | Admitting: Speech Pathology

## 2016-10-11 ENCOUNTER — Inpatient Hospital Stay (HOSPITAL_COMMUNITY): Payer: Self-pay | Admitting: Physical Therapy

## 2016-10-11 ENCOUNTER — Inpatient Hospital Stay (HOSPITAL_COMMUNITY): Payer: Self-pay

## 2016-10-11 NOTE — Progress Notes (Signed)
Occupational Therapy Session Note  Patient Details  Name: [name] L Mungin MRN: [id number] Date of Birth: [birth year]  Today's Date: 10/11/2016 OT Individual Time: 0700-0756 OT Individual Time Calculation (min): 56 min    Short Term Goals: Week 2:  OT Short Term Goal 1 (Week 2): Pt will demonstrate emergent awareness in functional context with min questioning cues OT Short Term Goal 2 (Week 2): Pt will perform toilet transfers with min A OT Short Term Goal 3 (Week 2): Pt will perform tub/shower transfers with min A OT Short Term Goal 4 (Week 2): Pt will perform toileting tasks with steady A  Skilled Therapeutic Interventions/Progress Updates:    Pt resting in bed upon arrival.  Pt engaged in BADL retraining including bathing at shower level and dressing with sit<>stand from w/c at sink.  Pt completed grooming tasks while standing at sink.  Pt wearing her underwear now with feminine pad.  Pt initially experienced difficulty with positioning pad but was able to problem solve solution with extra time.  Pt amb with RW to bed to straighten sheets/covers before returning to bed. Pt continues to exhibit impulsive behaviors and requires max verbal cues for safety awareness.  Pt completed all bathing/dressing tasks with supervision and functional amb with RW at steady A.  Focus on continued safety awareness, functional amb with RW, standing balance, and activity tolerance.   Therapy Documentation Pain:  Pt denied pain  See Function Navigator for Current Functional Status.   Therapy/Group: Individual Therapy  Rich BraveLanier, Ashey Tramontana Chappell 10/11/2016, 7:42 AM

## 2016-10-11 NOTE — Progress Notes (Signed)
[facility] PHYSICAL MEDICINE & REHABILITATION     PROGRESS NOTE    Subjective/Complaints: No new problems. Up with therapy in gym  ROS: pt denies nausea, vomiting, diarrhea, cough, shortness of breath or chest pain     Objective: Vital Signs: Blood pressure (!) 100/56, pulse 72, temperature 98.3 F (36.8 C), temperature source Oral, resp. rate 20, weight 53.9 kg (118 lb 12.8 oz), SpO2 99 %. No results found. No results for input(s): WBC, HGB, HCT, PLT in the last 72 hours. No results for input(s): NA, K, CL, GLUCOSE, BUN, CREATININE, CALCIUM in the last 72 hours.  Invalid input(s): CO CBG (last 3)  No results for input(s): GLUCAP in the last 72 hours.  Wt Readings from Last 3 Encounters:  10/10/16 53.9 kg (118 lb 12.8 oz)  09/24/16 47.1 kg (103 lb 13.4 oz)    Physical Exam:  Constitutional: She appears well-developed, well-nourished.  HENT: Normocephalic and atraumatic.  Eyes: EOMI. No discharge.  Cardiovascular: RRR. Respiratory: clear GI: Bowel sounds are normal. She exhibits no distension.  Musculoskeletal: She exhibits no edema or tenderness Neurological:  Left central 7 resolving. Minimal right sided sensory loss Motor: RUE 4+/5 proximal to distal. RUE still with mild ataxia LUE 5/5 RLE 4-/5 HF, 4/5 distally--stable   LLE 4+ to 5/5. Skin: Skin is warm and dry. Ulcer medial right knee healing Psychiatric: Her affect is flat  Assessment/Plan: 1. Functional and mobility deficits secondary to left thalamic ICH which require 3+ hours per day of interdisciplinary therapy in a comprehensive inpatient rehab setting. Physiatrist is providing close team supervision and 24 hour management of active medical problems listed below. Physiatrist and rehab team continue to assess barriers to discharge/monitor patient progress toward functional and medical goals.  Function:  Bathing Bathing position   Position: Shower  Bathing parts Body parts bathed by patient: Right  arm, Chest, Abdomen, Front perineal area, Right upper leg, Left upper leg, Left arm, Left lower leg, Right lower leg, Buttocks Body parts bathed by helper: Buttocks, Back  Bathing assist Assist Level: Supervision or verbal cues      Upper Body Dressing/Undressing Upper body dressing   What is the patient wearing?: Pull over shirt/dress     Pull over shirt/dress - Perfomed by patient: Thread/unthread right sleeve, Thread/unthread left sleeve, Put head through opening, Pull shirt over trunk          Upper body assist Assist Level: Supervision or verbal cues      Lower Body Dressing/Undressing Lower body dressing   What is the patient wearing?: Underwear, Pants, Non-skid slipper socks Underwear - Performed by patient: Thread/unthread right underwear leg, Thread/unthread left underwear leg, Pull underwear up/down Underwear - Performed by helper: Pull underwear up/down Pants- Performed by patient: Thread/unthread right pants leg, Thread/unthread left pants leg, Pull pants up/down Pants- Performed by helper: Fasten/unfasten pants Non-skid slipper socks- Performed by patient: Don/doff right sock, Don/doff left sock Non-skid slipper socks- Performed by helper: Don/doff right sock, Don/doff left sock                  Lower body assist Assist for lower body dressing: Touching or steadying assistance (Pt > 75%)      Toileting Toileting   Toileting steps completed by patient: Adjust clothing prior to toileting, Performs perineal hygiene, Adjust clothing after toileting Toileting steps completed by helper: Adjust clothing prior to toileting Toileting Assistive Devices: Grab bar or rail  Toileting assist Assist level: Touching or steadying assistance (Pt.75%)   Transfers  Chair/bed transfer   Chair/bed transfer method: Stand pivot Chair/bed transfer assist level: Supervision or verbal cues Chair/bed transfer assistive device: Armrests     Locomotion Ambulation     Max  distance: 100 ft Assist level: Touching or steadying assistance (Pt > 75%)   Wheelchair   Type: Manual Max wheelchair distance: 150 ft (BLE) Assist Level: Supervision or verbal cues  Cognition Comprehension Comprehension assist level: Follows basic conversation/direction with no assist  Expression Expression assist level: Expresses basic 75 - 89% of the time/requires cueing 10 - 24% of the time. Needs helper to occlude trach/needs to repeat words.  Social Interaction Social Interaction assist level: Interacts appropriately 75 - 89% of the time - Needs redirection for appropriate language or to initiate interaction.  Problem Solving Problem solving assist level: Solves basic 75 - 89% of the time/requires cueing 10 - 24% of the time  Memory Memory assist level: Recognizes or recalls 75 - 89% of the time/requires cueing 10 - 24% of the time   Medical Problem List and Plan: 1.  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  -continue CIR, PT-OT-SLP  -displaying improved awareness. Still impulsive 2.  DVT Prophylaxis/Anticoagulation: Pharmaceutical: Lovenox 3. Pain Management: tylenol prn.  4. Mood: LCSW to follow for evaluation and support.   -neuropsych eval appreciated  -on buspar and paxil currently  -continue  bromocriptine for attention/arousal at 5mg  bid-  5. Neuropsych: This patient is not fully capable of making decisions on her own behalf. 6. Right leg wound/Skin/Wound Care: wounds healing  7. Fluids/Electrolytes/Nutrition: eating better, pt happy with this  -appreciate dietary consult  -supplementing potassium  -have stopped megace---observe for intake   8. HTN: On lisinopril.   -NTG paste prn.  -controlled 1/14 9. Polysubstance abuse: has completed CIWA protocol. Counsel. 10. Constipation  Bowel reg increased on 1/13  Bowel movement x1 on 1/14 11. Incontinence: timed voids. Some improvement but not there  yet       LOS (Days) 15 A FACE TO FACE EVALUATION WAS PERFORMED  Ranelle OysterSWARTZ,Sircharles Holzheimer T, MD 10/11/2016 9:01 AM

## 2016-10-11 NOTE — Progress Notes (Signed)
Social Work Patient ID: Kelsey Smith, female   DOB: 11/24/1960, 56 y.o.   MRN: [id number]   Met with pt yesterday to review team conference and further discuss d/c plans.  She is pleased with progress she has made this week and feels comfortable with targeted d/c date 1/25 still.  Medicaid and SSD apps to be taken this week.  Discussed d/c location and she confirms that she was considering going home with another friend as her home only has space heaters.  Explained that wherever she is to d/c, the team strongly recommends 24/7 supervision.  She denies any concerns about having this at either location.  She then begins to vacillate on her plans and concludes, "Well, I guess I'll just go home.  Yeah, that's what I'm going to do."  She understands that caregiver will need to come in for education and agreeable with me contacting her roommate, Nicki Reaper, again to set this up. Able to reach [place] this morning and confirmed with him that we are still planning on 1/25 d/c and need for 24/7 assistance/ supervision.  He reports that the 3 roomates "are going to take care of that.  Yes, we have that covered."  Discussed need for education and he reports that should be done with another roommate, Altamese Dilling, as he is more available.  Have asked Nicki Reaper to assist me in letting Altamese Dilling know that I am trying to reach him as I have never connected with him despite multiple attempts.  Called Altamese Dilling this morning and phone is "unable to receive messages."  Will continue to try to reach him.  Winfield Caba, LCSW

## 2016-10-11 NOTE — Progress Notes (Signed)
Speech Language Pathology Weekly Progress and Session Note  Patient Details  Name: [place] L Lechtenberg MRN: [id number] Date of Birth: 11/24/1960  Beginning of progress report period: October 04, 2016 End of progress report period: October 11, 2016  Today's Date: 10/11/2016 SLP Individual Time: 8299-3716 SLP Individual Time Calculation (min): 55 min  Short Term Goals: Week 2: SLP Short Term Goal 1 (Week 2): Patient will recall new, daily information with supervision verbal cues.  SLP Short Term Goal 1 - Progress (Week 2): Not met SLP Short Term Goal 2 (Week 2): Patient will demonstrate complex problem solving for familiar tasks with supervision verbal cues.  SLP Short Term Goal 2 - Progress (Week 2): Not met SLP Short Term Goal 3 (Week 2): Patient will demonstrate selective attention to task in a moderately disctracting enviornment with supervision verbal cues for redirection.  SLP Short Term Goal 3 - Progress (Week 2): Met SLP Short Term Goal 4 (Week 2): Patient will self-monitor and correct errors during functional tasks with supervision verbal cues.  SLP Short Term Goal 4 - Progress (Week 2): Not met    New Short Term Goals: Week 3: SLP Short Term Goal 1 (Week 3): Patient will recall new, daily information with supervision verbal cues and use of external memory aids.  SLP Short Term Goal 2 (Week 3): Patient will demonstrate complex problem solving for familiar tasks with supervision verbal cues.  SLP Short Term Goal 3 (Week 3): Patient will demonstrate selective attention to task in a moderately disctracting enviornment with supervision verbal cues for redirection for 60 minutes.  SLP Short Term Goal 4 (Week 3): Patient will self-monitor and correct errors during functional tasks with supervision verbal cues.   Weekly Progress Updates: Patient demonstrates continued progress and has met 1 of 4 STG's this reporting period. Currently, patient requires overall Min A to complete functional  and familiar tasks safely in regards to problem solving, recall with use of strategies and emergent awareness. Patient and family education is ongoing. Patient would benefit from continued skilled SLP intervention to maximize her cognitive function and overall functional independence prior to discharge.     Intensity: Minumum of 1-2 x/day, 30 to 90 minutes Frequency: 3 to 5 out of 7 days Duration/Length of Stay: 1/25 Treatment/Interventions: Cognitive remediation/compensation;Cueing hierarchy;Functional tasks;Patient/family education;Internal/external aids;Environmental controls;Therapeutic Activities   Daily Session  Skilled Therapeutic Interventions: Skilled treatment session focused on cognitive goals. Patient recalled and wrote down events from morning therapy session with Mod I. Patient also followed 2-step directions with Mod A verbal cues needed for recall and Max A verbal cues needed for recall of 4 step directions with use of external memory aids. Patient demonstrated selective attention to tasks for ~45 minutes with supervision verbal cues for redirection. Patient left upright in recliner with all needs within reach. Continue with current plan of care.      Function:   Cognition Comprehension Comprehension assist level: Follows basic conversation/direction with no assist  Expression   Expression assist level: Expresses basic 75 - 89% of the time/requires cueing 10 - 24% of the time. Needs helper to occlude trach/needs to repeat words.  Social Interaction Social Interaction assist level: Interacts appropriately 75 - 89% of the time - Needs redirection for appropriate language or to initiate interaction.  Problem Solving Problem solving assist level: Solves basic 75 - 89% of the time/requires cueing 10 - 24% of the time  Memory Memory assist level: Recognizes or recalls 75 - 89% of the time/requires cueing 10 -  24% of the time   Pain No/Denies Pain   Therapy/Group: Individual  Therapy  Tashawna Thom, [place] 10/11/2016, 3:25 PM

## 2016-10-11 NOTE — Progress Notes (Signed)
Physical Therapy Weekly Progress Note  Patient Details  Name: Kelsey Smith MRN: [id number] Date of Birth: 1961/08/23  Beginning of progress report period: October 05, 2016 End of progress report period: October 11, 2016  Today's Date: 10/11/2016 PT Individual Time: 3794-3276 PT Individual Time Calculation (min): 61 min   Pt performed functional transfers w/c to bed and toilet with supervision, cues for safety and locking brakes.  Gait 200', 150' with RW with supervision, cues for Rt foot drag when fatigued. Obstacle course with RW with supervision, cues for safety with stepping over objects.  Gait without AD for balance training sideways, backwards all with min A, improving balance reactions noted.  Standing balance with tap ups to 6'' step initially min A, progressed to supervision with repetition.  Standing tolerance with putty task for Rt hand finding beads in putty.  Pt with improving Rt UE control.  Nu step for increasing activity tolerance, pt only able to tolerate 5 minutes before fatigued.  Patient has met 2 of 2 short term goals.  Pt with improving Rt LE control and balance. Able to gait in controlled environment with supervision with RW, continues to require occasional min A for gait in dynamic setting with multiple distractions.  Patient continues to demonstrate the following deficits muscle weakness, impaired timing and sequencing and decreased coordination, decreased awareness, decreased safety awareness and decreased memory and decreased standing balance, hemiplegia and decreased balance strategies and therefore will continue to benefit from skilled PT intervention to increase functional independence with mobility.  Patient progressing toward long term goals..  Continue plan of care.  PT Short Term Goals Week 2:  PT Short Term Goal 1 (Week 2): Pt will perform gait x 50' in controlled environment with supervision PT Short Term Goal 1 - Progress (Week 2): Met PT Short Term Goal  2 (Week 2): Pt will demo dynamic balance for functional task with min A PT Short Term Goal 2 - Progress (Week 2): Met  Skilled Therapeutic Interventions/Progress Updates:  Ambulation/gait training;Cognitive remediation/compensation;Discharge planning;DME/adaptive equipment instruction;Functional mobility training;Pain management;Splinting/orthotics;Therapeutic Activities;UE/LE Strength taining/ROM;Wheelchair propulsion/positioning;UE/LE Coordination activities;Therapeutic Exercise;Stair training;Patient/family education;Neuromuscular re-education;Functional electrical stimulation;Disease management/prevention;Community reintegration;Balance/vestibular training   Therapy Documentation Precautions:  Precautions Precautions: Fall Restrictions Weight Bearing Restrictions: No Pain: Pain Assessment Pain Assessment: No/denies pain   See Function Navigator for Current Functional Status.  Therapy/Group: Individual Therapy  Arnez Stoneking 10/11/2016, 11:45 AM

## 2016-10-11 NOTE — Patient Care Conference (Signed)
Inpatient RehabilitationTeam Conference and Plan of Care Update Date: 10/09/2016   Time: 2:40 PM    Patient Name: Kelsey Smith      Medical Record Number: [id number]  Date of Birth: [birth year] Sex: Female         Room/Bed: 4W25C/4W25C-01 Payor Info: Payor: MEDICAID PENDING / Plan: MEDICAID PENDING / Product Type: *No Product type* /    Admitting Diagnosis: traumatic IPH  Admit Date/Time:  09/26/2016  6:32 PM Admission Comments: No comment available   Primary Diagnosis:  Intraparenchymal hemorrhage of brain (HCC) Principal Problem: Intraparenchymal hemorrhage of brain [facility](HCC)  Patient Active Problem List   Diagnosis Date Noted  . Benign essential HTN   . Right hemiparesis (HCC)   . Slow transit constipation   . Lethargy   . IVH (intraventricular hemorrhage) (HCC) 09/26/2016  . B12 deficiency 09/26/2016  . Hypertensive emergency 09/26/2016  . Essential hypertension 09/26/2016  . Carotid aneurysm, left (HCC) 09/26/2016  . Open thigh wound 09/26/2016  . Somnolence   . Recurrent major depressive disorder (HCC)   . ETOH abuse   . Tobacco abuse   . Cocaine abuse   . Pure hypercholesterolemia   . 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 Faith RogueZachary Swartz Social Worker Present: Amada JupiterLucy Jaydin Boniface, LCSW Nurse Present: Chana Bodeeborah Sharp, RN PT Present: Aleda GranaVictoria Miller, PT;Other (comment) Judieth Keens(Karen Donaworth, PT) OT Present: Roney MansJennifer Smith, OT;Ardis Rowanom Lanier, COTA SLP Present: Feliberto Gottronourtney Payne, SLP PPS Coordinator present : Tora DuckMarie Noel, RN, CRRN     Current Status/Progress Goal Weekly Team Focus  Medical   improved po intake. bromocrpitine added to help attention.   increase attention and engagement  energy, attention, po intake   Bowel/Bladder   Pt continent of bowel. Continent of urine with periods of incont episodes. Increased frequency and urgancy  min  assist  assess output and freqency   Swallow/Nutrition/ Hydration             ADL's   activity tolerance improving, BADLs-min A for LB dressing, steady A for bathing and toileting, steady A for functional transfers, ongoing imuplive behaviors during transitional movements  supervision overall  activity tolerance, standiing balance, functional transfers, functional amb for home mgmt tasks, safety awareness   Mobility   min assist transfers & ambulation with RW up to 100 ft, min/mod assist stair negotiation  supervision overall  R NMR, cognitive remediation, gait training, transfers, stair negotiation, balance, endurance, pt education   Communication             Safety/Cognition/ Behavioral Observations  Min A  Supervision  problem solving and short-term recall    Pain   denies         Skin   wound/abrasion to right inner knee- foam allevyn inplace  keep wound free of infection min assist  assess skin and change dressing prn    Rehab Goals Patient on target to meet rehab goals: Yes *See Care Plan and progress notes for long and short-term goals.  Barriers to Discharge: ongoing cognitive deficits, prior substance abuse history    Possible Resolutions to Barriers:  see prior, continued med mgt    Discharge Planning/Teaching Needs:  Plan home with roomates who have committed to providing 24/7 assistance.  Plan to schedule education to begin now.   Team Discussion:  Improved po intake, insight and awareness.  Decreased c/o HA and pain.  Better with ADLs overall.  STM still very impaired and continues to require cues.  Supervision with gait and CUES.  Still incont of bladder but need to better adhere to timed toileting.  Pt not considering a change of d/c location - SW to follow up.  On track to meet goals.  Revisions to Treatment Plan:  None   Continued Need for Acute Rehabilitation Level of Care: The patient requires daily medical management by a physician with specialized training  in physical medicine and rehabilitation for the following conditions: Daily direction of a multidisciplinary physical rehabilitation program to ensure safe treatment while eliciting the highest outcome that is of practical value to the patient.: Yes Daily medical management of patient stability for increased activity during participation in an intensive rehabilitation regime.: Yes Daily analysis of laboratory values and/or radiology reports with any subsequent need for medication adjustment of medical intervention for : Neurological problems;Mood/behavior problems  Kelsey Smith 10/12/2016, 9:18 AM

## 2016-10-12 ENCOUNTER — Inpatient Hospital Stay (HOSPITAL_COMMUNITY): Payer: Self-pay | Admitting: Physical Therapy

## 2016-10-12 ENCOUNTER — Inpatient Hospital Stay (HOSPITAL_COMMUNITY): Payer: Medicaid Other | Admitting: Speech Pathology

## 2016-10-12 ENCOUNTER — Inpatient Hospital Stay (HOSPITAL_COMMUNITY): Payer: Self-pay

## 2016-10-12 ENCOUNTER — Inpatient Hospital Stay (HOSPITAL_COMMUNITY): Payer: Medicaid Other | Admitting: Physical Therapy

## 2016-10-12 NOTE — Progress Notes (Signed)
Occupational Therapy Session Note  Patient Details  Name: Kelsey Smith MRN: [id number] Date of Birth: 02-Sep-1961  Today's Date: 10/12/2016 OT Individual Time: 0700-0757 OT Individual Time Calculation (min): 57 min    Short Term Goals: Week 3:  OT Short Term Goal 1 (Week 3): STG=LTG secondary to ELOS  Skilled Therapeutic Interventions/Progress Updates:    Pt sitting EOB upon arrival eating breakfast.  Pt engaged in BADL retraining including bathing at shower level and dressing with sit<>stand from w/c at sink.  Pt amb with RW into bathroom to transfer to shower seat.  Pt completed all bathing tasks at close supervision level.  Pt completed dressing tasks with close supervision during sit<>stand and while standing to pull up pants.  Pt continues to exhibit impulsive behaviors during transitional movements.  Pt amb in room to bed and straightened up her bed prior to returning to EOB to finish eating breakfast.  Focus on activity tolerance, sit<>stand, standing balance, functional amb with RW, and safety awareness to increase independence with BADLs.  Therapy Documentation Precautions:  Precautions Precautions: Fall Restrictions Weight Bearing Restrictions: No Pain:  Pt denied pain  See Function Navigator for Current Functional Status.   Therapy/Group: Individual Therapy  Rich BraveLanier, Geremiah Fussell [place] 10/12/2016, 7:58 AM

## 2016-10-12 NOTE — Progress Notes (Signed)
Speech Language Pathology Daily Session Note  Patient Details  Name: Kelsey Smith MRN: [id number] Date of Birth: 02-Aug-1961  Today's Date: 10/12/2016 SLP Individual Time: 1330-1400 SLP Individual Time Calculation (min): 30 min  Short Term Goals: Week 3: SLP Short Term Goal 1 (Week 3): Patient will recall new, daily information with supervision verbal cues and use of external memory aids.  SLP Short Term Goal 2 (Week 3): Patient will demonstrate complex problem solving for familiar tasks with supervision verbal cues.  SLP Short Term Goal 3 (Week 3): Patient will demonstrate selective attention to task in a moderately disctracting enviornment with supervision verbal cues for redirection for 60 minutes.  SLP Short Term Goal 4 (Week 3): Patient will self-monitor and correct errors during functional tasks with supervision verbal cues.   Skilled Therapeutic Interventions: Skilled treatment session focused on cognition goals. SLP facilitated session by providing Mod A verbal cues for completion of deductive reasoning puzzles. Pt able to self-monitor and attempted to correct errors with supervision cues. Pt was returned to room, left in wheelchair with all needs within reach. Continue per current plan of care.      Function:    Cognition Comprehension Comprehension assist level: Follows basic conversation/direction with no assist  Expression   Expression assist level: Expresses basic 75 - 89% of the time/requires cueing 10 - 24% of the time. Needs helper to occlude trach/needs to repeat words.  Social Interaction Social Interaction assist level: Interacts appropriately 75 - 89% of the time - Needs redirection for appropriate language or to initiate interaction.  Problem Solving Problem solving assist level: Solves basic 75 - 89% of the time/requires cueing 10 - 24% of the time  Memory Memory assist level: Recognizes or recalls 75 - 89% of the time/requires cueing 10 - 24% of the time     Pain Pain Assessment Pain Assessment: No/denies pain  Therapy/Group: Individual Therapy  Danasia Baker 10/12/2016, 4:29 PM

## 2016-10-12 NOTE — Progress Notes (Signed)
Occupational Therapy Weekly Progress Note  Patient Details  Name: Kelsey Smith MRN: [id number] Date of Birth: Sep 26, 1960  Beginning of progress report period: October 06, 2015 End of progress report period: October 13, 2015  Patient has met 4 of 4 short term goals.  Pt made steady progress with BADLs during this past week.  Pt completes all BADLs with occasional steady A when standing during dressing tasks.  Pt continues to exhibit impaired emergent awareness, primarily during transitional movements, and requires min verbal cues for safety.  Pt continues to exhibit impulsive behaviors and will require supervision after discharge.  Pt's d/c plan to d/c home with roommates; they are working on coordinating 24 hr supervision.   Patient continues to demonstrate the following deficits: muscle weakness, decreased cardiorespiratoy endurance, impaired timing and sequencing and unbalanced muscle activation, decreased attention, decreased awareness, decreased problem solving, decreased safety awareness and decreased memory and decreased standing balance, decreased postural control and decreased balance strategies and therefore will continue to benefit from skilled OT intervention to enhance overall performance with BADL, iADL and Vocation.  Patient progressing toward long term goals..  Continue plan of care.  OT Short Term Goals Week 2:  OT Short Term Goal 1 (Week 2): Pt will demonstrate emergent awareness in functional context with min questioning cues OT Short Term Goal 2 (Week 2): Pt will perform toilet transfers with min A OT Short Term Goal 3 (Week 2): Pt will perform tub/shower transfers with min A OT Short Term Goal 4 (Week 2): Pt will perform toileting tasks with steady A Week 3:  OT Short Term Goal 1 (Week 3): STG=LTG secondary to ELOS     Therapy Documentation Precautions:  Precautions Precautions: Fall Restrictions Weight Bearing Restrictions: No  See Function Navigator for Current  Functional Status.    Leotis Shames [facility] 10/12/2016, 6:46 AM

## 2016-10-12 NOTE — Progress Notes (Signed)
Physical Therapy Session Note  Patient Details  Name: Kelsey Smith MRN: [id number] Date of Birth: Apr 15, 1961  Today's Date: 10/12/2016 PT Individual Time: 1015-1059 PT Individual Time Calculation (min): 44 min   Short Term Goals: Week 2:  PT Short Term Goal 1 (Week 2): Pt will perform gait x 50' in controlled environment with supervision PT Short Term Goal 1 - Progress (Week 2): Met PT Short Term Goal 2 (Week 2): Pt will demo dynamic balance for functional task with min A PT Short Term Goal 2 - Progress (Week 2): Met  Skilled Therapeutic Interventions/Progress Updates:    Pt up in w/c upon arrival and able to propel 150 ft with cues to stay in straight line. Multiple ambulation attempts performed, supervision 150 ft with rw and 110 ft with min guard and no device. Pt with decreased Rt LE control and decreased knee flexion with swing phase with fatigue. Decreasing stability and increased support needed without device and fatigue. Standing static and dynamic balance activities performed with variable surfaces and base of support. Poor stability with heel-toe walking. Pt returned to room with call light in reach following session. Patient denies any questions or concerns.     Therapy Documentation Precautions:  Precautions Precautions: Fall Restrictions Weight Bearing Restrictions: No Pain: denies pain    See Function Navigator for Current Functional Status.   Therapy/Group: Individual Therapy  Linard Millers, PT, [place] 10/12/2016, 12:48 PM

## 2016-10-12 NOTE — Progress Notes (Signed)
[facility]ICINE & RE[facility]ABILITATION     PROGRESS NOTE    Subjective/Complaints: Sitting in bed. Just woke up. No new complaints  ROS: pt denies nausea, vomiting, diarrhea, cough, shortness of breath or chest pain    Objective: Vital Signs: Blood pressure (!) 93/58, pulse 69, temperature 98.4 F (36.9 C), temperature source Oral, resp. rate 18, weight 53.9 kg (118 lb 12.8 oz), SpO2 100 %. No results found. No results for input(s): WBC, HGB, HCT, PLT in the last 72 hours. No results for input(s): NA, K, CL, GLUCOSE, BUN, CREATININE, CALCIUM in the last 72 hours.  Invalid input(s): CO CBG (last 3)  No results for input(s): GLUCAP in the last 72 hours.  Wt Readings from Last 3 Encounters:  10/10/16 53.9 kg (118 lb 12.8 oz)  09/24/16 47.1 kg (103 lb 13.4 oz)    Physical Exam:  Constitutional: She appears well-developed, well-nourished.  HENT: Normocephalic and atraumatic.  Eyes: EOMI. No discharge.  Cardiovascular: RRR. Respiratory: clear to ausc GI: Bowel sounds are normal. She exhibits no distension.  Musculoskeletal: She exhibits no edema or tenderness Neurological:  Left central 7 resolving. Minimal right sided sensory loss Motor: RUE 4+/5 proximal to distal. RUE still with minimal ataxia LUE 5/5 RLE 4-/5 HF, 4/5 distally--stable   LLE 4+ to 5/5. Skin: Skin is warm and dry. Ulcer medial right knee healing nicely Psychiatric: Her affect is flat  Assessment/Plan: 1. Functional and mobility deficits secondary to left thalamic ICH which require 3+ hours per day of interdisciplinary therapy in a comprehensive inpatient rehab setting. Physiatrist is providing close team supervision and 24 hour management of active medical problems listed below. Physiatrist and rehab team continue to assess barriers to discharge/monitor patient progress toward functional and medical goals.  Function:  Bathing Bathing position   Position: Shower  Bathing parts Body parts  bathed by patient: Right arm, Chest, Abdomen, Front perineal area, Right upper leg, Left upper leg, Left arm, Left lower leg, Right lower leg, Buttocks Body parts bathed by helper: Buttocks, Back  Bathing assist Assist Level: Supervision or verbal cues      Upper Body Dressing/Undressing Upper body dressing   What is the patient wearing?: Pull over shirt/dress     Pull over shirt/dress - Perfomed by patient: Thread/unthread right sleeve, Thread/unthread left sleeve, Put head through opening, Pull shirt over trunk          Upper body assist Assist Level: Supervision or verbal cues      Lower Body Dressing/Undressing Lower body dressing   What is the patient wearing?: Underwear, Pants, Non-skid slipper socks Underwear - Performed by patient: Thread/unthread right underwear leg, Thread/unthread left underwear leg, Pull underwear up/down Underwear - Performed by helper: Pull underwear up/down Pants- Performed by patient: Thread/unthread right pants leg, Thread/unthread left pants leg, Pull pants up/down Pants- Performed by helper: Fasten/unfasten pants Non-skid slipper socks- Performed by patient: Don/doff right sock, Don/doff left sock Non-skid slipper socks- Performed by helper: Don/doff right sock, Don/doff left sock                  Lower body assist Assist for lower body dressing: Supervision or verbal cues      Toileting Toileting   Toileting steps completed by patient: Adjust clothing prior to toileting, Performs perineal hygiene, Adjust clothing after toileting Toileting steps completed by helper: Adjust clothing prior to toileting Toileting Assistive Devices: Grab bar or rail  Toileting assist Assist level: Supervision or verbal cues   Transfers Chair/bed  transfer   Chair/bed transfer method: Stand pivot Chair/bed transfer assist level: Supervision or verbal cues Chair/bed transfer assistive device: Armrests     Locomotion Ambulation     Max distance: 100  ft Assist level: Touching or steadying assistance (Pt > 75%)   Wheelchair   Type: Manual Max wheelchair distance: 150 ft (BLE) Assist Level: Supervision or verbal cues  Cognition Comprehension Comprehension assist level: Follows basic conversation/direction with no assist  Expression Expression assist level: Expresses basic 75 - 89% of the time/requires cueing 10 - 24% of the time. Needs helper to occlude trach/needs to repeat words.  Social Interaction Social Interaction assist level: Interacts appropriately 75 - 89% of the time - Needs redirection for appropriate language or to initiate interaction.  Problem Solving Problem solving assist level: Solves basic 75 - 89% of the time/requires cueing 10 - 24% of the time  Memory Memory assist level: Recognizes or recalls 75 - 89% of the time/requires cueing 10 - 24% of the time   Medical Problem List and Plan: 1.  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  -continue CIR, PT-OT-SLP  -remains impulsive at times.  2.  DVT Prophylaxis/Anticoagulation: Pharmaceutical: Lovenox 3. Pain Management: tylenol prn.  4. Mood: LCSW to follow for evaluation and support.   -neuropsych eval appreciated  -on buspar and paxil currently  -continue  bromocriptine for attention/arousal at 5mg  bid-  5. Neuropsych: This patient is not fully capable of making decisions on her own behalf. 6. Right leg wound/Skin/Wound Care: wounds healing  7. Fluids/Electrolytes/Nutrition: eating well  -appreciate dietary consult  -supplementing potassium     8. HTN: On lisinopril.   -NTG paste prn.  -controlled 1/14 9. Polysubstance abuse: has completed CIWA protocol. Counsel. 10. Constipation  Bowel reg increased on 1/13  Bowel movement x1 on 1/18 11. Incontinence: timed voids. continued improvement but not there yet       LOS (Days) 16 A FACE TO FACE EVALUATION WAS PERFORMED  Ranelle Oyster,  MD 10/12/2016 10:12 AM

## 2016-10-12 NOTE — Progress Notes (Signed)
Physical Therapy Session Note  Patient Details  Name: Kelsey Smith MRN: [id number] Date of Birth: 1961/08/25  Today's Date: 10/12/2016 PT Individual Time: 1520-1600 PT Individual Time Calculation (min): 40 min   Skilled Therapeutic Interventions/Progress Updates:   Patient in bed upon arrival, reporting fatigue but agreeable to therapy. Patient transferred to wheelchair with supervision then requesting to toilet and impulsive when attempting to stand from unlocked wheelchair. Performed toileting tasks and hand hygiene at sink with supervision and propelled wheelchair using BUE and intermittent use of LE to gym with supervision. Patient reporting dislike/discomfort with too large 18 x 18 ultra hemi height wheelchair, therefore switched out for 16 x 16 manual wheelchair and adjusted leg rests for improved sitting tolerance and and added foam grips to B brake handles for improved comfort with locking/unlocking brakes. Performed NuStep using BLE only for 2 trials of 4 min each, first trial set to level 5 lowered to level 3 for last trial due to fatigue. Patient negotiated up/down 12 stairs using 2 rails with reciprocal pattern and close supervision. Patient instructed in propelling new wheelchair back to room with supervision-min A and max verbal cues for overall sequencing and coordination of task. Patient left sitting in wheelchair with all needs in reach and verbalized understanding to call for assistance when ready to return to bed.   Therapy Documentation Precautions:  Precautions Precautions: Fall Restrictions Weight Bearing Restrictions: No Pain: Pain Assessment Pain Assessment: No/denies pain   See Function Navigator for Current Functional Status.   Therapy/Group: Individual Therapy  [name], Prudencio PairRebecca A 10/12/2016, 4:09 PM

## 2016-10-12 NOTE — Progress Notes (Signed)
Occupational Therapy Note  Patient Details  Name: Kelsey Smith MRN: [id number] Date of Birth: 1961-08-26  Today's Date: 10/12/2016 OT Individual Time: 1300-1330 OT Individual Time Calculation (min): 30 min   Pt denied pain Individual Therapy  Pt practiced stepping over into tub.  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  Pt able to step over safely into tub using wall as support.  Pt practiced X 3 at supervision level.  Recommended that pt practice at home with HHOT before bathing in tub.  Recommended that pt not sit on bottom of tub until therapy advises.  Pt amb with RW to room and engaged in functional amb with RW for simple home mgmt tasks.  Pt continues to require min verbal cues for safety awareness.   Lavone NeriLanier, Kashara Blocher [facility] 10/12/2016, 3:05 PM

## 2016-10-13 ENCOUNTER — Inpatient Hospital Stay (HOSPITAL_COMMUNITY): Payer: Self-pay | Admitting: Occupational Therapy

## 2016-10-13 NOTE — Progress Notes (Signed)
Occupational Therapy Session Note  Patient Details  Name: Kelsey Smith MRN: [id number] Date of Birth: [birth year]  Today's Date: 10/13/2016 OT Individual Time: 1255-1340 OT Individual Time Calculation (min): 45 min    Short Term Goals: Week 1:  OT Short Term Goal 1 (Week 1): Pt will perform 3/3 toileting with mod A for balance  OT Short Term Goal 1 - Progress (Week 1): Met OT Short Term Goal 2 (Week 1): Pt will don Lb clothing with min A sit to stand OT Short Term Goal 2 - Progress (Week 1): Met OT Short Term Goal 3 (Week 1): Pt will attend to functional task for 10 min with minimal cuing  OT Short Term Goal 3 - Progress (Week 1): Met OT Short Term Goal 4 (Week 1): Pt will demonstrate emergent awareness in functional context with min questioning cues OT Short Term Goal 4 - Progress (Week 1): Progressing toward goal Week 2:  OT Short Term Goal 1 (Week 2): Pt will demonstrate emergent awareness in functional context with min questioning cues OT Short Term Goal 2 (Week 2): Pt will perform toilet transfers with min A OT Short Term Goal 3 (Week 2): Pt will perform tub/shower transfers with min A OT Short Term Goal 4 (Week 2): Pt will perform toileting tasks with steady A Week 3:  OT Short Term Goal 1 (Week 3): STG=LTG secondary to ELOS  Skilled Therapeutic Interventions/Progress Updates: Pt participated in skilled OT session focusing on NMR, attention, dynamic standing balance, memory, and incorporating R UE during functional task at dominant level. Pt was sitting in w/c at time of arrival, agreeable to tx. Pt self propelled to dayroom (pt preferring to self propel instead of ambulate). She then participated in graded leisure activity (bowling) standing without device and min guard, using R UE at dominant level to toss weighted ball. Activity upgraded for pt to take multiple steps prior to throwing to simulate actual activity demands when approaching bowling lane. She experienced 2 small LOBs  with therapist correction. Music utilized for memory remediation. Had pt recall favorite music from 90s when played on laptop. She exhibited 25% accuracy with recall of familiar songs/bands with mod cues. She then reported needing to void, ambulated to public restroom with RW and supervision, completing all tasks at supervision level. At end of session, self propelled back to room in manner as written above, and was left with family at time of departure.      Therapy Documentation Precautions:  Precautions Precautions: Fall Restrictions Weight Bearing Restrictions: No   Pain: No c/o pain during session    ADL: ADL ADL Comments: see functional navigator    See Function Navigator for Current Functional Status.   Therapy/Group: Individual Therapy  Mirage Pfefferkorn A Caelum Federici 10/13/2016, 4:14 PM

## 2016-10-13 NOTE — Progress Notes (Signed)
[facility] PHYSICAL MEDICINE & REHABILITATION     PROGRESS NOTE    Subjective/Complaints: No complaints. Feels well.   ROS: pt denies nausea, vomiting, diarrhea, cough, shortness of breath or chest pain    Objective: Vital Signs: Blood pressure 128/68, pulse 72, temperature 98 F (36.7 C), temperature source Oral, resp. rate 18, weight 52 kg (114 lb 10.2 oz), SpO2 98 %. No results found. No results for input(s): WBC, HGB, HCT, PLT in the last 72 hours. No results for input(s): NA, K, CL, GLUCOSE, BUN, CREATININE, CALCIUM in the last 72 hours.  Invalid input(s): CO CBG (last 3)  No results for input(s): GLUCAP in the last 72 hours.  Wt Readings from Last 3 Encounters:  10/13/16 52 kg (114 lb 10.2 oz)  09/24/16 47.1 kg (103 lb 13.4 oz)    Physical Exam:  Constitutional: She appears well-developed, well-nourished.  HENT: Normocephalic and atraumatic.  Eyes: EOMI. No discharge.  Cardiovascular: RRR. Respiratory: clear bilaterally to ausc GI: Bowel sounds are normal. She exhibits no distension.  Musculoskeletal: She exhibits no edema or tenderness Neurological:  Left central 7 resolving. Minimal right sided sensory loss Motor: RUE 4+/5 proximal to distal. RUE with decreasing ataxia LUE 5/5 RLE 4-/5 HF, 4/5 distally--stable   LLE 4+ to 5/5. Skin: Skin is warm and dry. Ulcer medial right knee healing  Psychiatric: Her affect is more dynamic  Assessment/Plan: 1. Functional and mobility deficits secondary to left thalamic ICH which require 3+ hours per day of interdisciplinary therapy in a comprehensive inpatient rehab setting. Physiatrist is providing close team supervision and 24 hour management of active medical problems listed below. Physiatrist and rehab team continue to assess barriers to discharge/monitor patient progress toward functional and medical goals.  Function:  Bathing Bathing position   Position: Shower  Bathing parts Body parts bathed by patient:  Right arm, Chest, Abdomen, Front perineal area, Right upper leg, Left upper leg, Left arm, Left lower leg, Right lower leg, Buttocks Body parts bathed by helper: Buttocks, Back  Bathing assist Assist Level: Supervision or verbal cues      Upper Body Dressing/Undressing Upper body dressing   What is the patient wearing?: Pull over shirt/dress     Pull over shirt/dress - Perfomed by patient: Thread/unthread right sleeve, Thread/unthread left sleeve, Put head through opening, Pull shirt over trunk          Upper body assist Assist Level: Supervision or verbal cues      Lower Body Dressing/Undressing Lower body dressing   What is the patient wearing?: Underwear, Pants, Non-skid slipper socks Underwear - Performed by patient: Thread/unthread right underwear leg, Thread/unthread left underwear leg, Pull underwear up/down Underwear - Performed by helper: Pull underwear up/down Pants- Performed by patient: Thread/unthread right pants leg, Thread/unthread left pants leg, Pull pants up/down Pants- Performed by helper: Fasten/unfasten pants Non-skid slipper socks- Performed by patient: Don/doff right sock, Don/doff left sock Non-skid slipper socks- Performed by helper: Don/doff right sock, Don/doff left sock                  Lower body assist Assist for lower body dressing: Supervision or verbal cues      Toileting Toileting   Toileting steps completed by patient: Performs perineal hygiene, Adjust clothing prior to toileting, Adjust clothing after toileting Toileting steps completed by helper: Adjust clothing prior to toileting Toileting Assistive Devices: Grab bar or rail  Toileting assist Assist level: Supervision or verbal cues   Transfers Chair/bed transfer   Chair/bed  transfer method: Stand pivot Chair/bed transfer assist level: Supervision or verbal cues Chair/bed transfer assistive device: Armrests     Locomotion Ambulation     Max distance: 150 Assist level:  Touching or steadying assistance (Pt > 75%)   Wheelchair   Type: Manual Max wheelchair distance: 150 Assist Level: Touching or steadying assistance (Pt > 75%)  Cognition Comprehension Comprehension assist level: Follows basic conversation/direction with no assist  Expression Expression assist level: Expresses basic needs/ideas: With no assist  Social Interaction Social Interaction assist level: Interacts appropriately 75 - 89% of the time - Needs redirection for appropriate language or to initiate interaction.  Problem Solving Problem solving assist level: Solves basic 75 - 89% of the time/requires cueing 10 - 24% of the time  Memory Memory assist level: Recognizes or recalls 75 - 89% of the time/requires cueing 10 - 24% of the time   Medical Problem List and Plan: 1.  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  -continue CIR, PT-OT-SLP  -improving insight.  2.  DVT Prophylaxis/Anticoagulation: Pharmaceutical: Lovenox 3. Pain Management: tylenol prn.  4. Mood: LCSW to follow for evaluation and support.   -neuropsych eval appreciated  -on buspar and paxil currently  -continue  bromocriptine for attention/arousal at 5mg  bid-  5. Neuropsych: This patient is not fully capable of making decisions on her own behalf. 6. Right leg wound/Skin/Wound Care: wounds healing  7. Fluids/Electrolytes/Nutrition: eating well  -appreciate dietary consult  -supplementing potassium   -check lytes monday  8. HTN: On lisinopril.   -NTG paste prn.  -controlled 1/14 9. Polysubstance abuse: has completed CIWA protocol. Counsel. 10. Constipation  Bowel reg increased on 1/13  Bowel movement x1 on 1/18 11. Incontinence: continue with timed voids. Less and less incontinence being reported       LOS (Days) 17 A FACE TO FACE EVALUATION WAS PERFORMED  Ranelle OysterSWARTZ,Aviyana Sonntag T, MD 10/13/2016 9:51 AM

## 2016-10-14 ENCOUNTER — Inpatient Hospital Stay (HOSPITAL_COMMUNITY): Payer: Medicaid Other | Admitting: Physical Therapy

## 2016-10-14 NOTE — Progress Notes (Signed)
Physical Therapy Session Note  Patient Details  Name: Kelsey Smith MRN: [id number] Date of Birth: 1961/05/09  Today's Date: 10/14/2016 PT Individual Time: 1617-1700 PT Individual Time Calculation (min): 43 min    Skilled Therapeutic Interventions/Progress Updates:  Pt received in bed & agreeable to tx, denying c/o pain. Pt donned shoes sitting at EOB with supervision. Session focused on gait training, transfers, balance, cognitive remediation and pt education. Pt ambulated throughout unit, over ramp & mulch, and through obstacle course with RW & supervision. Pt requires frequent cuing for improved toe clearance and heel strike RLE; pt with good demo but only able to sustain this for ~5-10 steps before requiring cuing again. Pt performed car transfer at small SUV simulated height with cuing for technique & safety. Educated pt that is she is not allowed to drive until cleared by MD. Pt stood on compliant surface without BUE support and close supervision while assembling simple pipe tree shape. Pt able to problem solve and self-correct without cuing. Pt reported need to use restroom and ambulated back to room. Pt with continent void on toilet and performed all toileting tasks with supervision. At end of session pt left in bed with all needs within reach & bed alarm set.  Pt reported she has occasionally been ambulating to bathroom without nursing assistance; educated pt on increased fall risk and need to call for assistance with pt voicing understanding.   Therapy Documentation Precautions:  Precautions Precautions: Fall Restrictions Weight Bearing Restrictions: No   See Function Navigator for Current Functional Status.   Therapy/Group: Individual Therapy  Sandi MariscalVictoria M Lequita Meadowcroft 10/14/2016, 5:13 PM

## 2016-10-14 NOTE — Progress Notes (Signed)
[place] PHYSICAL MEDICINE & REHABILITATION     PROGRESS NOTE    Subjective/Complaints: Up eating breakfast. Good appetite.   ROS: pt denies nausea, vomiting, diarrhea, cough, shortness of breath or chest pain     Objective: Vital Signs: Blood pressure (!) 98/55, pulse 72, temperature 98.4 F (36.9 C), temperature source Oral, resp. rate 18, weight 52 kg (114 lb 10.2 oz), SpO2 97 %. No results found. No results for input(s): WBC, HGB, HCT, PLT in the last 72 hours. No results for input(s): NA, K, CL, GLUCOSE, BUN, CREATININE, CALCIUM in the last 72 hours.  Invalid input(s): CO CBG (last 3)  No results for input(s): GLUCAP in the last 72 hours.  Wt Readings from Last 3 Encounters:  10/13/16 52 kg (114 lb 10.2 oz)  09/24/16 47.1 kg (103 lb 13.4 oz)    Physical Exam:  Constitutional: She appears well-developed, well-nourished.  HENT: Normocephalic and atraumatic.  Eyes: EOMI. No discharge.  Cardiovascular: RRR. Respiratory: clear bilaterally to ausc GI: Bowel sounds are normal. She exhibits no distension.  Musculoskeletal: She exhibits no edema or tenderness Neurological:  Left central 7 resolving. Minimal right sided sensory loss Motor: RUE 4+/5 proximal to distal. RUE with decreasing ataxia LUE 5/5 RLE 4-/5 HF, 4/5 distally--stable   LLE 4+ to 5/5. Skin: Skin is warm and dry. Ulcer medial right knee healing  Psychiatric: Her affect is more dynamic and engaging. Smiles frequently  Assessment/Plan: 1. Functional and mobility deficits secondary to left thalamic ICH which require 3+ hours per day of interdisciplinary therapy in a comprehensive inpatient rehab setting. Physiatrist is providing close team supervision and 24 hour management of active medical problems listed below. Physiatrist and rehab team continue to assess barriers to discharge/monitor patient progress toward functional and medical goals.  Function:  Bathing Bathing position   Position: Shower   Bathing parts Body parts bathed by patient: Right arm, Chest, Abdomen, Front perineal area, Right upper leg, Left upper leg, Left arm, Left lower leg, Right lower leg, Buttocks Body parts bathed by helper: Buttocks, Back  Bathing assist Assist Level: Supervision or verbal cues      Upper Body Dressing/Undressing Upper body dressing   What is the patient wearing?: Pull over shirt/dress     Pull over shirt/dress - Perfomed by patient: Thread/unthread right sleeve, Thread/unthread left sleeve, Put head through opening, Pull shirt over trunk          Upper body assist Assist Level: Supervision or verbal cues      Lower Body Dressing/Undressing Lower body dressing   What is the patient wearing?: Underwear, Pants, Non-skid slipper socks Underwear - Performed by patient: Thread/unthread right underwear leg, Thread/unthread left underwear leg, Pull underwear up/down Underwear - Performed by helper: Pull underwear up/down Pants- Performed by patient: Thread/unthread right pants leg, Thread/unthread left pants leg, Pull pants up/down Pants- Performed by helper: Fasten/unfasten pants Non-skid slipper socks- Performed by patient: Don/doff right sock, Don/doff left sock Non-skid slipper socks- Performed by helper: Don/doff right sock, Don/doff left sock                  Lower body assist Assist for lower body dressing: Supervision or verbal cues      Toileting Toileting   Toileting steps completed by patient: Performs perineal hygiene, Adjust clothing prior to toileting, Adjust clothing after toileting Toileting steps completed by helper: Adjust clothing prior to toileting Toileting Assistive Devices: Grab bar or rail  Toileting assist Assist level: Supervision or verbal cues  Transfers Chair/bed transfer   Chair/bed transfer method: Stand pivot Chair/bed transfer assist level: Supervision or verbal cues Chair/bed transfer assistive device: Armrests      Locomotion Ambulation     Max distance: 150 Assist level: Touching or steadying assistance (Pt > 75%)   Wheelchair   Type: Manual Max wheelchair distance: 150 Assist Level: Touching or steadying assistance (Pt > 75%)  Cognition Comprehension Comprehension assist level: Follows basic conversation/direction with no assist  Expression Expression assist level: Expresses basic needs/ideas: With no assist  Social Interaction Social Interaction assist level: Interacts appropriately 75 - 89% of the time - Needs redirection for appropriate language or to initiate interaction.  Problem Solving Problem solving assist level: Solves basic 75 - 89% of the time/requires cueing 10 - 24% of the time  Memory Memory assist level: Recognizes or recalls 75 - 89% of the time/requires cueing 10 - 24% of the time   Medical Problem List and Plan: 1.  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  -continue CIR, PT-OT-SLP  -improving daily 2.  DVT Prophylaxis/Anticoagulation: Pharmaceutical: Lovenox 3. Pain Management: tylenol prn.  4. Mood: LCSW to follow for evaluation and support.   -neuropsych eval appreciated  -on buspar and paxil currently  -continue  bromocriptine for attention/arousal at 5mg  bid-  5. Neuropsych: This patient is not fully capable of making decisions on her own behalf. 6. Right leg wound/Skin/Wound Care: wounds healing  7. Fluids/Electrolytes/Nutrition: eating well  -appreciate dietary consult  -supplementing potassium   -check lytes monday  8. HTN: On lisinopril.    -NTG paste prn.  -controlled 1/14 9. Polysubstance abuse: has completed CIWA protocol. Counsel. 10. Constipation  Bowel reg increased on 1/13  Bowel movement x1 on 1/18 11. Incontinence: continue with timed voids. Less and less incontinence being reported       LOS (Days) 18 A FACE TO FACE EVALUATION WAS PERFORMED  Faith Rogue T,  MD 10/14/2016 10:40 AM

## 2016-10-15 ENCOUNTER — Inpatient Hospital Stay (HOSPITAL_COMMUNITY): Payer: Medicaid Other | Admitting: Physical Therapy

## 2016-10-15 ENCOUNTER — Inpatient Hospital Stay (HOSPITAL_COMMUNITY): Payer: Medicaid Other | Admitting: Speech Pathology

## 2016-10-15 ENCOUNTER — Inpatient Hospital Stay (HOSPITAL_COMMUNITY): Payer: Self-pay

## 2016-10-15 LAB — BASIC METABOLIC PANEL
ANION GAP: 6 (ref 5–15)
BUN: 16 mg/dL (ref 6–20)
CHLORIDE: 104 mmol/L (ref 101–111)
CO2: 27 mmol/L (ref 22–32)
Calcium: 9.3 mg/dL (ref 8.9–10.3)
Creatinine, Ser: 0.81 mg/dL (ref 0.44–1.00)
GFR calc non Af Amer: >60 mL/min (ref >60)
Glucose, Bld: 133 mg/dL — ABNORMAL HIGH (ref 65–99)
Potassium: 4.1 mmol/L (ref 3.5–5.1)
SODIUM: 137 mmol/L (ref 135–145)

## 2016-10-15 NOTE — Progress Notes (Signed)
Occupational Therapy Session Note  Patient Details  Name: Kelsey Smith MRN: [id number] Date of Birth: March 27, 1961  Today's Date: 10/15/2016 OT Individual Time: 0700-0800 OT Individual Time Calculation (min): 60 min    Short Term Goals: Week 2:  OT Short Term Goal 1 (Week 2): Pt will demonstrate emergent awareness in functional context with min questioning cues OT Short Term Goal 1 - Progress (Week 2): Met OT Short Term Goal 2 (Week 2): Pt will perform toilet transfers with min A OT Short Term Goal 2 - Progress (Week 2): Met OT Short Term Goal 3 (Week 2): Pt will perform tub/shower transfers with min A OT Short Term Goal 3 - Progress (Week 2): Met OT Short Term Goal 4 (Week 2): Pt will perform toileting tasks with steady A OT Short Term Goal 4 - Progress (Week 2): Met  OT Short Term Goal 1 - Progress (Week 3): STG=LTG secondary to ELOS  Skilled Therapeutic Interventions/Progress Updates:    Pt initially engaged in BADL retraining including bathing at shower level and dressing with sit<>stand from w/c at sink.  Pt amb with RW in room to gather supplies prior to entering bathroom.  Pt completed bathing/dressing tasks at supervision level.  Pt transitioned to simple home mgmt tasks at [facility] level with focus on safety.  Educated pt on importance of slowing down during tasks to avoid possible unsafe behaviors.  Pt verbalized understanding.  Pt returned to bed with bed alarm activated and all needs within reach.   Therapy Documentation Precautions:  Precautions Precautions: Fall Restrictions Weight Bearing Restrictions: No Pain:  Pt denied pain  See Function Navigator for Current Functional Status.   Therapy/Group: Individual Therapy  Leroy Libman 10/15/2016, 8:00 AM

## 2016-10-15 NOTE — Progress Notes (Signed)
Physical Therapy Session Note  Patient Details  Name: Kelsey Smith MRN: [id number] Date of Birth: 06-02-1961  Today's Date: 10/15/2016 PT Individual Time: 0903-1005 PT Individual Time Calculation (min): 62 min   Skilled Therapeutic Interventions/Progress Updates:  Pt received in w/c & agreeable to tx, denying c/o pain. Pt ambulated within room to bathroom with RW & supervision, performed toilet transfers with continent void, and completed peri hygiene and hand washing with distant supervision. Pt reports her roommate(s) plan to be here tomorrow morning for caregiver education. Session focused on gait training, cognitive remediation, neuromuscular re-education, balance, and pt education. Pt ambulated room<>gym with RW & supervision with occasional cuing for increased toe clearance and heel strike RLE. Pt stood on compliant surface without BUE support while assembling pipe tree shapes with moderate complexity & min cuing for correctness. Activity also focused on forced use of RUE for neuro re-ed. Pt participated in Cortez[place]erg Balance Test & scored 50/56; educated pt on interpretation of score and fall risk with pt verbalizing understanding and need to use RW for mobility. Patient demonstrates increased fall risk as noted by score of 50/56 on Berg Balance Scale.  (<36= high risk for falls, close to 100%; 37-45 significant >80%; 46-51 moderate >50%; 52-55 lower >25%). Pt reported need to use restroom and ambulated back to room; pt with continent BM on toilet.  Discussed home modifications with pt, providing recommendations for increased safety with pt verbalizing understanding of all education (remove throw rugs, clear out pathways, cooking modifications and need for supervision/assistance). Educated pt on energy conservation techniques in community. At end of session pt left sitting in bed with alarm set and all needs within reach.      Therapy Documentation Precautions:  Precautions Precautions:  Fall Restrictions Weight Bearing Restrictions: No  Balance: Balance Balance Assessed: Yes Standardized Balance Assessment Standardized Balance Assessment: Berg Balance Test Berg Balance Test Sit to Stand: Able to stand without using hands and stabilize independently Standing Unsupported: Able to stand safely 2 minutes Sitting with Back Unsupported but Feet Supported on Floor or Stool: Able to sit safely and securely 2 minutes Stand to Sit: Sits safely with minimal use of hands Transfers: Able to transfer safely, definite need of hands Standing Unsupported with Eyes Closed: Able to stand 10 seconds safely Standing Ubsupported with Feet Together: Able to place feet together independently and stand 1 minute safely From Standing, Reach Forward with Outstretched Arm: Can reach confidently >25 cm (10") From Standing Position, Pick up Object from Floor: Able to pick up shoe, needs supervision From Standing Position, Turn to Look Behind Over each Shoulder: Looks behind from both sides and weight shifts well Turn 360 Degrees: Able to turn 360 degrees safely but slowly (turns to both sides with supervision and increased time) Standing Unsupported, Alternately Place Feet on Step/Stool: Able to complete 4 steps without aid or supervision (completes 8 steps with supervisionand increased time) Standing Unsupported, One Foot in Front: Able to place foot tandem independently and hold 30 seconds Standing on One Leg: Able to lift leg independently and hold > 10 seconds Total Score: 50   See Function Navigator for Current Functional Status.   Therapy/Group: Individual Therapy  Sandi MariscalVictoria M Rozlyn Smith 10/15/2016, 12:47 PM

## 2016-10-15 NOTE — Consult Note (Signed)
  NEUROBEHAVIORAL STATUS EXAM - [facility]   MEDICAL NECESSITY:  [facility]el[facility] for a neurobehavioral status exam owing to the patient's diagnosis of TBI, and to assist in treatment planning during admission.   Records indicate that Kelsey Smith is a "56 y.o. female who was admitted on 09/23/16 with complaints of HA, right sided weakness and difficulty speaking.  Patient had been drinking heavily the night before, sustained a fall and was helped back in bed but found on the floor next morning not acting normally. UDS positive for cocaine. 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 She was started on CIWA protocol and bleed felt to be due to HTN in setting of heavy alcohol use. MRI/MRA brain done revealing left thalamic hemorrhage with regional mass effect/edema, mild to moderate age advanced white matter disease, MRA negative for left thalamic hemorrhage and 5 mm L-ICA aneurysm.  2 D echo done today--results pending.  WOC consulted for input on old full thickness right thigh injury and recommended Aquacel to absorb drainage.  Patient with resultant HA, aphasia, poor attention, difficulty walking due to right sided weakness. CIR recommended for follow up therapy."   Kelsey Smith was previously seen by this provider on 10/01/2016. Pertinent information from that visit is as follows:   Overall, Kelsey Smith denied experiencing any cognitive difficulties secondary to her present medical circumstances. However, she was noted to be inattentive at times and she tended to lose her train of thought. As such, I recommend outpatient neuropsychological evaluation. Emotionally, she described depressive and anxious symptoms that are being managed via an antidepressant. I also discussed possible participation in counseling and support groups. She did not appear that  interested in therapy, though I will ask her social worker to provide resources for these upon discharged if interested. The patient was encouraged to call upon neuropsychology as warranted throughout the remainder of her admission.   During today's visit, Kelsey Smith denied any new or ongoing cognitive issues except that she will sometimes lose her train of thought. Emotionally, her mood has improved and she is excited to discharge home this week. She has no trepidation about going home and she has ample social support via her three female roommates. She feels that she has made excellent progress in therapy and has enjoyed the rehab staff.   PROCEDURES: [1 unit 96116]  MENTAL STATUS EXAM: APPEARANCE:  Normal/appropriate GEN:  Somewhat inattentive  MOOD:  Euthymic       AFFECT:  Mood congruent SPEECH:  Normal  THOUGHT CONTENT:  Appropriate HALLUCINATIONS:  None INTELLIGENCE:  Average  INSIGHT:  Fair to good JUDGMENT:  Fair to good SUICIDAL IDEATION:  Denies SI   HOMICIDAL IDEATION:   Denies HI   IMPRESSION: Overall, Kelsey Smith denied experiencing any cognitive issues, though I continue to recommend undergoing comprehensive neuropsychological evaluation upon discharge secondary to observed inattentiveness. Her mood appears to have improved and I see no need for any changes to her current psychological treatment. I encouraged the patient to call upon neuropsychology prior to discharge if she becomes aware of any new cognitive or emotional issues that are concerning.   DIAGNOSIS:  TBI   Debbe MountsAdam T. Shanina Kepple, Psy.D., ABN Board-Certified Clinical Neuropsychologist

## 2016-10-15 NOTE — Progress Notes (Signed)
[place] PHYSICAL MEDICINE & REHABILITATION     PROGRESS NOTE    Subjective/Complaints: Up eating breakfast. Good appetite.   ROS: pt denies nausea, vomiting, diarrhea, cough, shortness of breath or chest pain     Objective: Vital Signs: Blood pressure 102/70, pulse 71, temperature 98.4 F (36.9 C), temperature source Oral, resp. rate 16, weight 52 kg (114 lb 10.2 oz), SpO2 97 %. No results found. No results for input(s): WBC, HGB, HCT, PLT in the last 72 hours.  Recent Labs  10/15/16 0816  NA 137  K 4.1  CL 104  GLUCOSE 133*  BUN 16  CREATININE 0.81  CALCIUM 9.3   CBG (last 3)  No results for input(s): GLUCAP in the last 72 hours.  Wt Readings from Last 3 Encounters:  10/13/16 52 kg (114 lb 10.2 oz)  09/24/16 47.1 kg (103 lb 13.4 oz)    Physical Exam:  Constitutional: She appears well-developed, well-nourished.  HENT: Normocephalic and atraumatic.  Eyes: EOMI. No discharge.  Cardiovascular: RRR. Respiratory: clear bilaterally to ausc GI: BS+  Musculoskeletal: She exhibits no edema or tenderness Neurological:  Left central 7 resolving. Minimal right sided sensory loss Motor: RUE 4+ to 5/5 proximal to distal. RUE with decreasing ataxia LUE 5/5 RLE 4-/5 HF, 4/5 distally--stable   LLE 4+ to 5/5. Skin: Skin is warm and dry. Ulcer medial right knee healing  Psychiatric: pleasant and engaging.   Assessment/Plan: 1. Functional and mobility deficits secondary to left thalamic ICH which require 3+ hours per day of interdisciplinary therapy in a comprehensive inpatient rehab setting. Physiatrist is providing close team supervision and 24 hour management of active medical problems listed below. Physiatrist and rehab team continue to assess barriers to discharge/monitor patient progress toward functional and medical goals.  Function:  Bathing Bathing position   Position: Shower  Bathing parts Body parts bathed by patient: Right arm, Chest, Abdomen, Front  perineal area, Right upper leg, Left upper leg, Left arm, Left lower leg, Right lower leg, Buttocks, Back Body parts bathed by helper: Buttocks, Back  Bathing assist Assist Level: Supervision or verbal cues      Upper Body Dressing/Undressing Upper body dressing   What is the patient wearing?: Pull over shirt/dress     Pull over shirt/dress - Perfomed by patient: Thread/unthread right sleeve, Thread/unthread left sleeve, Put head through opening, Pull shirt over trunk          Upper body assist Assist Level: Supervision or verbal cues      Lower Body Dressing/Undressing Lower body dressing   What is the patient wearing?: Underwear, Pants, Non-skid slipper socks, Shoes Underwear - Performed by patient: Thread/unthread right underwear leg, Thread/unthread left underwear leg, Pull underwear up/down Underwear - Performed by helper: Pull underwear up/down Pants- Performed by patient: Thread/unthread right pants leg, Thread/unthread left pants leg, Pull pants up/down Pants- Performed by helper: Fasten/unfasten pants Non-skid slipper socks- Performed by patient: Don/doff right sock, Don/doff left sock Non-skid slipper socks- Performed by helper: Don/doff right sock, Don/doff left sock       Shoes - Performed by helper: Don/doff right shoe, Don/doff left shoe, Fasten right, Fasten left          Lower body assist Assist for lower body dressing: Supervision or verbal cues      Toileting Toileting   Toileting steps completed by patient: Performs perineal hygiene, Adjust clothing prior to toileting, Adjust clothing after toileting Toileting steps completed by helper: Adjust clothing prior to toileting Toileting Assistive Devices: Grab  bar or rail  Toileting assist Assist level: Supervision or verbal cues   Transfers Chair/bed transfer   Chair/bed transfer method: Stand pivot Chair/bed transfer assist level: Supervision or verbal cues Chair/bed transfer assistive device:  Armrests     Locomotion Ambulation     Max distance: >150 ft Assist level: Supervision or verbal cues   Wheelchair   Type: Manual Max wheelchair distance: 150 Assist Level: Touching or steadying assistance (Pt > 75%)  Cognition Comprehension Comprehension assist level: Follows basic conversation/direction with no assist  Expression Expression assist level: Expresses basic needs/ideas: With no assist  Social Interaction Social Interaction assist level: Interacts appropriately 75 - 89% of the time - Needs redirection for appropriate language or to initiate interaction.  Problem Solving Problem solving assist level: Solves basic 75 - 89% of the time/requires cueing 10 - 24% of the time  Memory Memory assist level: Recognizes or recalls 75 - 89% of the time/requires cueing 10 - 24% of the time   Medical Problem List and Plan: 1.  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  -continue CIR, PT-OT-SLP  -working toward 1/25 discharge 2.  DVT Prophylaxis/Anticoagulation: Pharmaceutical: Lovenox 3. Pain Management: tylenol prn.  4. Mood: LCSW to follow for evaluation and support.   -neuropsych eval appreciated  -on buspar and paxil currently  -continue  bromocriptine for attention/arousal at 5mg  bid-  5. Neuropsych: This patient is not fully capable of making decisions on her own behalf. 6. Right leg wound/Skin/Wound Care: wounds healing  7. Fluids/Electrolytes/Nutrition: eating well  -appreciate dietary consult  -supplementing potassium   -I personally reviewed all of the patient's labs today, and lab work is within normal limits.  8. HTN: On lisinopril.    -NTG paste prn.  -controlled 1/14 9. Polysubstance abuse: has completed CIWA protocol. Counsel. 10. Constipation  Bowel reg increased on 1/13  Bowel movement x1 on 1/18 11. Incontinence: continue with timed voids. Less and less incontinence being reported        LOS (Days) 19 A FACE TO FACE EVALUATION WAS PERFORMED  Ranelle OysterSWARTZ,ZACHARY T, MD 10/15/2016 9:26 AM

## 2016-10-15 NOTE — Progress Notes (Signed)
Occupational Therapy Note  Patient Details  Name: [name] L Atiyeh MRN: [id number] Date of Birth: 06-22-1961  Today's Date: 10/15/2016 OT Individual Time: 1300-1330 OT Individual Time Calculation (min): 30 min   Pt denied pain Individual Therapy  Pt amb with RW to ADL apartment and practiced stepping over into the tub without use of grab bars.  Pt performed task X 3 at supervision level.  Pt engaged in simple home mgmt tasks [facility] with focus on RW safety awareness.  Pt returned to room and remained in w/c with all needs within reach.  Pt requires min verbal cues to pick up her RLE during return to room.    Lavone NeriLanier, Betsey Sossamon [facility] 10/15/2016, 3:08 PM

## 2016-10-15 NOTE — Progress Notes (Signed)
Speech Language Pathology Daily Session Note  Patient Details  Name: Kelsey Smith MRN: [id number] Date of Birth: 04/26/1961  Today's Date: 10/15/2016 SLP Individual Time: 1345-1430 SLP Individual Time Calculation (min): 45 min  Short Term Goals: Please see week 3 goals    Skilled Therapeutic Interventions: Skilled treatment session focused on addressing cognition goals. Upon SLP arrival patient reported that she was supposed to log/write daily events to assist with recall.  However, she reported that she forgot to do it.  SLP facilitated session by providing set-up of past schedules and external aid and pen.  Patient required Supervision level question cues to include details of daily events.  Plan for education tomorrow with roommate.  Continue with current plan of care.    Function:  Cognition Comprehension Comprehension assist level: Follows basic conversation/direction with no assist  Expression   Expression assist level: Expresses complex ideas: With extra time/assistive device  Social Interaction Social Interaction assist level: Interacts appropriately with others with medication or extra time (anti-anxiety, antidepressant).  Problem Solving Problem solving assist level: Solves basic problems with no assist;Solves complex 90% of the time/cues < 10% of the time  Memory Memory assist level: Recognizes or recalls 90% of the time/requires cueing < 10% of the time    Pain Pain Assessment Pain Assessment: No/denies pain  Therapy/Group: Individual Therapy  Kelsey FerrettiMelissa Lashanti Smith, M.A., CCC-SLP [phone]  Kelsey Smith 10/15/2016, 2:28 PM

## 2016-10-16 ENCOUNTER — Inpatient Hospital Stay (HOSPITAL_COMMUNITY): Payer: Medicaid Other | Admitting: Physical Therapy

## 2016-10-16 ENCOUNTER — Inpatient Hospital Stay (HOSPITAL_COMMUNITY): Payer: Self-pay | Admitting: Physical Therapy

## 2016-10-16 ENCOUNTER — Inpatient Hospital Stay (HOSPITAL_COMMUNITY): Payer: Medicaid Other | Admitting: Speech Pathology

## 2016-10-16 ENCOUNTER — Inpatient Hospital Stay (HOSPITAL_COMMUNITY): Payer: Self-pay

## 2016-10-16 MED ORDER — BROMOCRIPTINE MESYLATE 2.5 MG PO TABS
2.5000 mg | ORAL_TABLET | Freq: Two times a day (BID) | ORAL | Status: DC
  Administered 2016-10-16 – 2016-10-17 (×2): 2.5 mg via ORAL
  Filled 2016-10-16 (×3): qty 1

## 2016-10-16 NOTE — Progress Notes (Signed)
Physical Therapy Note  Patient Details  Name: Kelsey Smith MRN: [id number] Date of Birth: 03/04/1961 Today's Date: 10/16/2016    Time: 1130-1155 25 minutes  1:1 No c/o pain. Pt performed gait in home and controlled environments with supervision with RW.  Pt and roommate educated on [place] with handout and pt able to perform with handout and visual cues. Pt and roommate state they feel comfortable with d/c home tomorrow at supervision level.   Taylon Louison 10/16/2016, 11:53 AM

## 2016-10-16 NOTE — Progress Notes (Signed)
Social Work Patient ID: Kelsey Smith, female   DOB: 1960-09-27, 56 y.o.   MRN: [id number]   Therapists agreed that pt ready for earlier d/c and recommend d/c tomorrow.  MD aware and agreed.  Pt and roommate, Kelsey Smith, in room this morning and agreeable with d/c.  Kelsey Smith here to attend therapy for education.  Had very blunt conversation with both about pt's need for 24/7 supervision/ CGA at home.  Both made aware that, per other roommate's request, I have continued to leave VM and text messages for roommate, Kelsey Smith, about need to receive education. This gentleman never responded and neither pt nor other roommate surprised by this. Explained that the lack of follow through leaves myself and team with concerns about reliability of roomates to cover 24/7 support at home.  Pt and Kelsey Smith nod in agreement and state that they understand the concerns, however, pt feels "We'll get it worked out.  I know we will."  Explained to both that she will assume risk of injury or worse if she is to ever be left alone at home.  She is aware of option for SNF/ RHP if she feels 24/7 will not be covered and she declines this option.  Roommate, Kelsey Smith, sitting and listening, however, does not offer further information/ response.  He does agree to review all education provided to him today with the other roommate, Kelsey Smith.  Have also made referral to [facility] for PT, OT, ST and SW given concerns. Also, discussed with pt the need to avoid re-start of ETOH and drug abuse.  She quickly states, "Oh no.  I'm not doing that.  I feel great and can't imagine going back to that."  Will provide pt with local support resources she may access if needed. Planning for d/c tomorrow.  Kelsey Disbrow, LCSW

## 2016-10-16 NOTE — Progress Notes (Signed)
[place] PHYSICAL MEDICINE & REHABILITATION     PROGRESS NOTE    Subjective/Complaints: Up on walker. Feeling good. Asked if she could go home tomorrow.   ROS: pt denies nausea, vomiting, diarrhea, cough, shortness of breath or chest pain     Objective: Vital Signs: Blood pressure 98/82, pulse 70, temperature 98 F (36.7 C), temperature source Oral, resp. rate 18, weight 52 kg (114 lb 10.2 oz), SpO2 99 %. No results found. No results for input(s): WBC, HGB, HCT, PLT in the last 72 hours.  Recent Labs  10/15/16 0816  NA 137  K 4.1  CL 104  GLUCOSE 133*  BUN 16  CREATININE 0.81  CALCIUM 9.3   CBG (last 3)  No results for input(s): GLUCAP in the last 72 hours.  Wt Readings from Last 3 Encounters:  10/13/16 52 kg (114 lb 10.2 oz)  09/24/16 47.1 kg (103 lb 13.4 oz)    Physical Exam:  Constitutional: She appears well-developed, well-nourished.  HENT: Normocephalic and atraumatic.  Eyes: EOMI. No discharge.  Cardiovascular: RRR. Respiratory: clear bilaterally to ausc GI: BS+  Musculoskeletal: She exhibits no edema or tenderness Neurological:  Left central 7 resolving. Minimal right sided sensory loss Motor: RUE 4+ to 5/5 proximal to distal. RUE with decreasing ataxia LUE 5/5 RLE 4-/5 HF, 4/5 distally--stable   LLE 4+ to 5/5. Skin: Skin is warm and dry. Ulcer medial right knee healing  Psychiatric: pleasant and engaging.   Assessment/Plan: 1. Functional and mobility deficits secondary to left thalamic ICH which require 3+ hours per day of interdisciplinary therapy in a comprehensive inpatient rehab setting. Physiatrist is providing close team supervision and 24 hour management of active medical problems listed below. Physiatrist and rehab team continue to assess barriers to discharge/monitor patient progress toward functional and medical goals.  Function:  Bathing Bathing position   Position: Shower  Bathing parts Body parts bathed by patient: Right arm,  Chest, Abdomen, Front perineal area, Right upper leg, Left upper leg, Left arm, Left lower leg, Right lower leg, Buttocks, Back Body parts bathed by helper: Buttocks, Back  Bathing assist Assist Level: Supervision or verbal cues      Upper Body Dressing/Undressing Upper body dressing   What is the patient wearing?: Pull over shirt/dress     Pull over shirt/dress - Perfomed by patient: Thread/unthread right sleeve, Thread/unthread left sleeve, Put head through opening, Pull shirt over trunk          Upper body assist Assist Level: Supervision or verbal cues      Lower Body Dressing/Undressing Lower body dressing   What is the patient wearing?: Underwear, Pants, Non-skid slipper socks, Shoes Underwear - Performed by patient: Thread/unthread right underwear leg, Thread/unthread left underwear leg, Pull underwear up/down Underwear - Performed by helper: Pull underwear up/down Pants- Performed by patient: Thread/unthread right pants leg, Thread/unthread left pants leg, Pull pants up/down Pants- Performed by helper: Fasten/unfasten pants Non-skid slipper socks- Performed by patient: Don/doff right sock, Don/doff left sock Non-skid slipper socks- Performed by helper: Don/doff right sock, Don/doff left sock       Shoes - Performed by helper: Don/doff right shoe, Don/doff left shoe, Fasten right, Fasten left          Lower body assist Assist for lower body dressing: Supervision or verbal cues      Toileting Toileting   Toileting steps completed by patient: Adjust clothing prior to toileting, Adjust clothing after toileting, Performs perineal hygiene Toileting steps completed by helper: Adjust clothing  prior to toileting Toileting Assistive Devices: Other (comment) (RW)  Toileting assist Assist level: Supervision or verbal cues   Transfers Chair/bed transfer   Chair/bed transfer method: Ambulatory Chair/bed transfer assist level: Supervision or verbal cues Chair/bed transfer  assistive device: Patent attorneyWalker     Locomotion Ambulation     Max distance: >150 ft Assist level: Supervision or verbal cues   Wheelchair Wheelchair activity did not occur: N/A Type: Manual Max wheelchair distance: 150 Assist Level: Touching or steadying assistance (Pt > 75%)  Cognition Comprehension Comprehension assist level: Follows basic conversation/direction with no assist  Expression Expression assist level: Expresses complex ideas: With extra time/assistive device  Social Interaction Social Interaction assist level: Interacts appropriately with others with medication or extra time (anti-anxiety, antidepressant).  Problem Solving Problem solving assist level: Solves complex 90% of the time/cues < 10% of the time  Memory Memory assist level: Recognizes or recalls 90% of the time/requires cueing < 10% of the time   Medical Problem List and Plan: 1.  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  -continue CIR, PT-OT-SLP  -can go home tomorrow from my standpoint 2.  DVT Prophylaxis/Anticoagulation: Pharmaceutical: Lovenox 3. Pain Management: tylenol prn.  4. Mood: LCSW to follow for evaluation and support.   -neuropsych eval appreciated  -on buspar and paxil currently  -continue  bromocriptine for attention/arousal---decrease to 2.5mg   5. Neuropsych: This patient is not fully capable of making decisions on her own behalf. 6. Right leg wound/Skin/Wound Care: wounds healing  7. Fluids/Electrolytes/Nutrition: eating well  -appreciate dietary consult  -supplementing potassium     8. HTN: On lisinopril.    -NTG paste prn. 9. Polysubstance abuse: has completed CIWA protocol. Counsel. 10. Constipation  Bowel reg increased on 1/13  Bowel movement x1 on 1/18 11. Incontinence: continue with timed voids. Less and less incontinence being reported       LOS (Days) 20 A FACE TO FACE EVALUATION WAS  PERFORMED  Ranelle OysterSWARTZ,Eytan Carrigan T, MD 10/16/2016 9:37 AM

## 2016-10-16 NOTE — Discharge Summary (Signed)
Physician Discharge Summary  Patient ID: Kelsey Smith MRN: [id number] DOB/AGE: 05/04/1961 56 y.o.  Admit date: 09/26/2016 Discharge date: 10/17/2016  Discharge Diagnoses:  Principal Problem:   Intraparenchymal hemorrhage of brain (HCC) - L thalamic Active Problems:   Benign essential HTN   Right hemiparesis (HCC)   Slow transit constipation   Lethargy   Discharged Condition: Stable.   Significant Diagnostic Studies: Dg Abd Portable 1v  Result Date: 09/30/2016 CLINICAL DATA:  Nausea for 2 days EXAM: PORTABLE ABDOMEN - 1 VIEW COMPARISON:  None. FINDINGS: Scattered large and small bowel gas are seen. Fecal material is noted throughout the colon. No obstructive changes are seen. No free air is noted. No acute bony abnormality is seen. IMPRESSION: No acute abnormality noted. Electronically Signed   By: Alcide Clever M.D.   On: 09/30/2016 13:36    Labs:  Basic Metabolic Panel: BMP Latest Ref Rng & Units 10/15/2016 10/01/2016 09/27/2016  Glucose 65 - 99 mg/dL 119(J) 478(G) 956(O)  BUN 6 - 20 mg/dL 16 16 9   Creatinine 0.44 - 1.00 mg/dL 1.30 8.65 7.84  Sodium 135 - 145 mmol/L 137 136 135  Potassium 3.5 - 5.1 mmol/L 4.1 4.3 3.3(L)  Chloride 101 - 111 mmol/L 104 98(L) 101  CO2 22 - 32 mmol/L 27 28 24   Calcium 8.9 - 10.3 mg/dL 9.3 9.8 9.9    CBC: CBC Latest Ref Rng & Units 10/01/2016 09/27/2016 09/26/2016  WBC 4.0 - 10.5 K/uL 9.0 10.8(H) 9.1  Hemoglobin 12.0 - 15.0 g/dL 69.6 29.5 28.4  Hematocrit 36.0 - 46.0 % 41.5 38.4 37.8  Platelets 150 - 400 K/uL 454(H) 316 292    CBG: No results for input(s): GLUCAP in the last 168 hours.  Brief HPI:   Kelsey L Carrasis an 56 y.o.femalewho was admitted on 09/23/16 with complaints of  HA, right sided weakness and difficulty speaking.   UDS positive for cocaine with BP 182/104.  CT head done revealing acute left thalamic ICH with intraventricular extension. She was started on CIWA protocol and bleed felt to be due to HTN in setting of heavy alcohol  use. MRI/MRA brain done revealing left thalamic hemorrhage with regional mass effect/edema, mild to moderate age advanced white matter disease, MRA negative in region of left thalamic hemorrhage and 5 mm L-ICA aneurysm noted. WOC consulted for input on old full thickness right thigh injury and recommended Aquacel to absorb drainage.  Patient with resultant HA, aphasia, poor attention, difficulty walking due to right sided weakness. CIR recommended for follow up therapy   Hospital Course: Kelsey L Pulido was admitted to rehab 09/26/2016 for inpatient therapies to consist of PT, ST and OT at least three hours five days a week. Past admission physiatrist, therapy team and rehab RN have worked together to provide customized collaborative inpatient rehab. Blood pressures were monitored on qid basis and have shown improvement in control. She continues on low dose lisinopril and follow up labs shows stable renal status.  She was found to have hypokalemia at admission and kdur was added for supplement. Follow up labs shows resolution with stable K+.  Anxiety has been managed on home dose of Buspar and Paxil.  She was started on bromocriptine for activation with good results. Lethargy has resolved with improvement in attention and this was tapered to 2.5 mg bid prior to discharge.  Megace was used briefly to help stimulate appetite and discontinued as intake improved. Mood is stable and she was given information on local support groups as well  as outpatient treatment program as patient is committed to cessation of drug use. She is continent of bowel and bladder. She has made great progress and requires supervision due to cognitive impairments. She will continue to receive follow up HHPT, HHOT, HHST and HHSW by [facility] after discharge.    Rehab course: During patient's stay in rehab weekly team conferences were held to monitor patient's progress, set goals and discuss barriers to discharge. At admission,  patient required max assist with basic self care tasks and total assist with mobility. Cognition was impaired in part by lethargy with deficits in recall, attention, awareness and problem solving with MoCA score 18/30. She has had improvement in activity tolerance, balance, postural control, as well as ability to compensate for deficits. She is has had improvement in functional use RUE  and RLE as well as improved awareness. She is able to complete ADL tasks with supervision. She irequires supervision for transfers and to ambulate 200' with RW. She requires supervision with basic and familiar tasks for safety, problem solving and recall with use of external aids.  MoCA score has improved to 28/30. Family education was completed with roommate regarding all aspects of safety as well as HEP.    Disposition: 01-Home or Self Care  Diet: Cardiac diet.   Special Instructions: 1. NO driving till cleared by  MD. 2. Family to assist with medications and financial tasks.  3. Recheck BMET in 1-2 weeks for follow upon need for kdur.     Discharge Instructions    Ambulatory referral to Physical Medicine Rehab    Complete by:  As directed    1-2 weeks transitional care     Allergies as of 10/17/2016   No Known Allergies     Medication List    STOP taking these medications   ibuprofen 200 MG tablet Commonly known as:  ADVIL,MOTRIN     TAKE these medications   atorvastatin 10 MG tablet Commonly known as:  LIPITOR Take 1 tablet (10 mg total) by mouth daily at 6 PM.   bromocriptine 2.5 MG tablet Commonly known as:  PARLODEL Take 1 tablet (2.5 mg total) by mouth 2 (two) times daily.   busPIRone 10 MG tablet Commonly known as:  BUSPAR Take 1 tablet (10 mg total) by mouth every morning.   cyanocobalamin 1000 MCG tablet Take 1 tablet (1,000 mcg total) by mouth daily.   folic acid 1 MG tablet Commonly known as:  FOLVITE Take 1 tablet (1 mg total) by mouth daily.   lisinopril 20 MG  tablet Commonly known as:  PRINIVIL,ZESTRIL Take 1 tablet (20 mg total) by mouth daily.   multivitamin with minerals Tabs tablet Take 1 tablet by mouth daily.   pantoprazole 40 MG tablet Commonly known as:  PROTONIX Take 1 tablet (40 mg total) by mouth daily.   PARoxetine 20 MG tablet Commonly known as:  PAXIL Take 1 tablet (20 mg total) by mouth every morning.   potassium chloride SA 20 MEQ tablet Commonly known as:  K-DUR,KLOR-CON Take 1 tablet (20 mEq total) by mouth daily.   senna-docusate 8.6-50 MG tablet Commonly known as:  Senokot-S Take 2 tablets by mouth 2 (two) times daily.   thiamine 100 MG tablet Take 1 tablet (100 mg total) by mouth daily.   traZODone 50 MG tablet Commonly known as:  DESYREL Take 0.5-1 tablets (25-50 mg total) by mouth at bedtime as needed for sleep. What changed:  how much to take  Follow-up Information    Ranelle OysterSWARTZ,ZACHARY T, MD Follow up.   Specialty:  Physical Medicine and Rehabilitation Why:  office will call you with follow up appointment Contact information: [address] [place] [address] [phone]        Xu,Jindong, MD. Call in 1 day(s).   Specialty:  Neurology Why:  for follow up appointment in 4 weeks.  Contact information: [address] [place] [phone]        [facility] Follow up.   Why:  Social worker Research scientist (medical)(Lucy Hoyle) will contact you with appointment information next week Contact information: [address] [place]  [address] [phone]          Signed: Jacquelynn CreeLove, Shanvi Moyd S 10/18/2016, 8:05 AM

## 2016-10-16 NOTE — Plan of Care (Signed)
Problem: RH Balance Goal: LTG Patient will maintain dynamic standing balance (PT) LTG:  Patient will maintain dynamic standing balance with assistance during mobility activities (PT)  Outcome: Completed/Met Date Met: 10/16/16 With RW  Problem: RH Bed to Chair Transfers Goal: LTG Patient will perform bed/chair transfers w/assist (PT) LTG: Patient will perform bed/chair transfers with assistance, with/without cues (PT).  Outcome: Completed/Met Date Met: 10/16/16 With RW  Problem: RH Car Transfers Goal: LTG Patient will perform car transfers with assist (PT) LTG: Patient will perform car transfers with assistance (PT).  Outcome: Completed/Met Date Met: 10/16/16 With RW  Problem: RH Ambulation Goal: LTG Patient will ambulate in controlled environment (PT) LTG: Patient will ambulate in a controlled environment, # of feet with assistance (PT).  Outcome: Completed/Met Date Met: 10/16/16 150 ft with rW Goal: LTG Patient will ambulate in home environment (PT) LTG: Patient will ambulate in home environment, # of feet with assistance (PT).  Outcome: Completed/Met Date Met: 10/16/16 50 ft with rW  Problem: RH Wheelchair Mobility Goal: LTG Patient will propel w/c in controlled environment (PT) LTG: Patient will propel wheelchair in controlled environment, # of feet with assist (PT)  Outcome: Adequate for Discharge Pt to d/c at ambulatory level  Problem: RH Stairs Goal: LTG Patient will ambulate up and down stairs w/assist (PT) LTG: Patient will ambulate up and down # of stairs with assistance (PT)  Outcome: Completed/Met Date Met: 10/16/16 12 steps with single rail   

## 2016-10-16 NOTE — Progress Notes (Signed)
Physical Therapy Discharge Summary  Patient Details  Name: Kelsey Smith MRN: [id number] Date of Birth: 1961-05-22  Today's Date: 10/16/2016 PT Individual Time: 803-900 PT Individual Time Calculation (min): 57 min     Patient has met 10 of 10 long term goals due to improved activity tolerance, improved balance, improved postural control, increased strength, decreased pain, ability to compensate for deficits, functional use of  right upper extremity and right lower extremity, improved attention, improved awareness and improved coordination.  Patient to discharge at an ambulatory level Supervision.   Patient's care partner, "Papaw" (pt's roommate), was present for caregiver training and he is independent to provide the necessary physical and cognitive assistance at discharge. Pt requires 24 hr supervision upon d/c.  Reasons goals not met: n/a  Recommendation:  Patient will benefit from ongoing skilled PT services in home health setting to continue to advance safe functional mobility, address ongoing impairments in decreased safety awareness, decreased balance, impaired cognition, decreased endurance, gait training, stair negotiation, and minimize fall risk.  Equipment: RW  Reasons for discharge: treatment goals met  Patient/family agrees with progress made and goals achieved: Yes  Skilled PT Treatment: Patient received in room, denying c/o pain, with caregiver ("Papaw") present for education. Session focused on pt/caregiver education and d/c planning. Pt performed all grad day activities including ambulating over even & uneven surfaces, ramp & curb negotiation, stair negotiation with single rail, bed mobility & transfers with supervision and use of RW overall. Pt continues to require cuing for increased toe clearance and heel strike RLE. Educated pt & caregiver on pt's need for cuing for increased safety as pt is impulsive, in addition to cuing for gait pattern. Pt retrieved items from  refrigerator with cuing to carry items in walker bag for increased safety. Pt with continent void on toilet and performed peri hygiene with distant supervision. Educated pt & caregiver on follow up HHPT and need for 24 hr supervision. Educated pt/caregiver that pt is not allowed to drive until cleared by MD. Pt & caregiver both voice no concerns regarding d/c home. At end of session pt left in w/c in room with caregiver present to supervise.  Pt's caregiver ("Papaw") checked off to assist her to bathroom; RN made aware.   Educated pt on home modifications for increased safety (remove throw rugs).   PT Discharge Precautions/Restrictions Precautions Precautions: Fall Precaution Comments: R hemiplegia Restrictions Weight Bearing Restrictions: No  Pain Pain Assessment Pain Assessment: No/denies pain  Vision/Perception  Pt reports she wears contacts at baseline.  Pt reports no changes in baseline vision.  Cognition Overall Cognitive Status: Impaired/Different from baseline Arousal/Alertness: Awake/alert Orientation Level: Oriented X4 Memory: Impaired Memory Impairment: Decreased recall of new information;Decreased short term memory Awareness: Impaired Awareness Impairment: Anticipatory impairment Safety/Judgment: Impaired (impulsive)   Sensation Sensation Light Touch: Appears Intact (BLE) Proprioception: Appears Intact (BLE) Coordination Gross Motor Movements are Fluid and Coordinated: No (improvement since admission)   Motor  Motor Motor: Hemiplegia Motor - Skilled Clinical Observations: R hemiplegia, general weakness   Mobility Bed Mobility Bed Mobility: Rolling Right;Rolling Left;Supine to Sit;Sit to Supine Rolling Right: 6: Modified independent (Device/Increase time) Rolling Left: 6: Modified independent (Device/Increase time) Supine to Sit: 6: Modified independent (Device/Increase time) Sit to Supine: 6: Modified independent (Device/Increase  time) Transfers Transfers: Yes Sit to Stand: 5: Supervision Stand to Sit: 5: Supervision  Locomotion  Ambulation Ambulation: Yes Ambulation/Gait Assistance: 5: Supervision Ambulation Distance (Feet): 200 Feet Assistive device: Rolling walker Ambulation/Gait Assistance Details: Verbal cues for  technique Ambulation/Gait Assistance Details: verbal cuing for increased toe clearance & heel strike RLE Gait Gait: Yes Gait Pattern: Decreased step length - left;Decreased step length - right;Decreased dorsiflexion - right (decreased heel strike RLE) High Level Ambulation High Level Ambulation:  (lateral walking with RW & supervision) Stairs / Additional Locomotion Stairs: Yes Stairs Assistance: 5: Supervision Stair Management Technique: One rail Right;One rail Left Number of Stairs: 12 Height of Stairs: 6 Ramp: 5: Supervision (with RW) Curb: 5: Supervision (with RW) Wheelchair Mobility Wheelchair Mobility: No   Trunk/Postural Assessment  Postural Control Postural Control:  (decreased postural righting reactions)   Balance Balance Balance Assessed: Yes Dynamic Standing Balance Dynamic Standing - Balance Support: Bilateral upper extremity supported (with RW) Dynamic Standing - Level of Assistance: 5: Stand by assistance   Extremity Assessment  RLE Assessment RLE Assessment: Exceptions to [facility] RLE AROM (degrees) Overall AROM Right Lower Extremity: Within functional limits for tasks assessed RLE Strength Right Hip Flexion: 3+/5 Right Knee Flexion: 4/5 Right Knee Extension: 4-/5 Right Ankle Dorsiflexion: 3/5 LLE Assessment LLE Assessment: Within Functional Limits   See Function Navigator for Current Functional Status.  Waunita Schooner 10/16/2016, 3:52 PM

## 2016-10-16 NOTE — Progress Notes (Signed)
Nutrition Follow-up  DOCUMENTATION CODES:   Not applicable  INTERVENTION:  Discontinue Ensure.  Encourage adequate PO intake.   NUTRITION DIAGNOSIS:   Increased nutrient needs related to  (therapy) as evidenced by estimated needs; ongoing  GOAL:   Patient will meet greater than or equal to 90% of their needs; met  MONITOR:   PO intake, Labs, Weight trends, Skin, I & O's  REASON FOR ASSESSMENT:   Consult Assessment of nutrition requirement/status  ASSESSMENT:   56 y.o. female who was admitted on 09/23/16 with complaints of  HA, right sided weakness and difficulty speaking.  Patient had been drinking heavily the night before, sustained a fall and was helped back in bed but found on the floor next morning not acting normally. UDS positive for cocaine. 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 She was started on CIWA protocol and bleed felt to be due to HTN in setting of heavy alcohol use. MRI/MRA brain done revealing left thalamic hemorrhage with regional mass effect/edema, mild to moderate age advanced white matter disease, MRA negative for left thalamic hemorrhage and 5 mm L-ICA aneurysm.  Meal completion has been 100%. Intake has been adequate. Pt currently has Ensure ordered and has been refusing them. RD to discontinue orders. Plans for discharge home tomorrow. Labs and medications reviewed.   Diet Order:  Diet Heart Room service appropriate? Yes; Fluid consistency: Thin  Skin:   (Non-pressure wound on R knee)  Last BM:  1/22  Height:   Ht Readings from Last 1 Encounters:  09/23/16 _0  (1.549 m)    Weight:   Wt Readings from Last 1 Encounters:  10/13/16 114 lb 10.2 oz (52 kg)    Ideal Body Weight:  47.7 kg  BMI:  Body mass index is 21.66 kg/m.  Estimated Nutritional Needs:   Kcal:  1500-1800  Protein:  60-70 grams  Fluid:  >/= 1.5 L/day  EDUCATION NEEDS:   No education needs  identified at this time  Corrin Parker, MS, RD, LDN Pager # [phone] After hours/ weekend pager # [phone]

## 2016-10-16 NOTE — Progress Notes (Signed)
Occupational Therapy Session Note  Patient Details  Name: Kelsey Smith MRN: [id number] Date of Birth: [birth year]  Today's Date: 10/16/2016 OT Individual Time: 0700-0800 OT Individual Time Calculation (min): 60 min    Short Term Goals: Week 2:  OT Short Term Goal 1 (Week 2): Pt will demonstrate emergent awareness in functional context with min questioning cues OT Short Term Goal 1 - Progress (Week 2): Met OT Short Term Goal 2 (Week 2): Pt will perform toilet transfers with min A OT Short Term Goal 2 - Progress (Week 2): Met OT Short Term Goal 3 (Week 2): Pt will perform tub/shower transfers with min A OT Short Term Goal 3 - Progress (Week 2): Met OT Short Term Goal 4 (Week 2): Pt will perform toileting tasks with steady A OT Short Term Goal 4 - Progress (Week 2): Met  OT Short Term Goal 1 - Progress (Week 3): STG=LTG 2/2 ELOS  Skilled Therapeutic Interventions/Progress Updates:    Pt completed all bathing and dressing tasks at supervision level this morning.  Pt completed toileting tasks and transfer at mod I level.  Pt's roommate present for education. Pt transitioned to tub room and practiced stepping into tub without use of grab bars.  Pt's roommate assisted with problem solving safe way to enter and exit tub.  Pt will have seat for tub after getting into tub.  Pt and roommate verbalized understanding of recommendation for 24 hour supervision.    Therapy Documentation Precautions:  Precautions Precautions: Fall Precaution Comments: R hemiplegia Restrictions Weight Bearing Restrictions: No General:   Vital Signs: Therapy Vitals BP: 98/82 Pain: Pain Assessment Pain Assessment: No/denies pain ADL: ADL ADL Comments: see functional navigator Exercises:   Other Treatments:    See Function Navigator for Current Functional Status.   Therapy/Group: Individual Therapy  Leroy Libman 10/16/2016, 9:14 AM

## 2016-10-16 NOTE — Progress Notes (Signed)
Speech Language Pathology Daily Session Note  Patient Details  Name: Kelsey Smith MRN: [id number] Date of Birth: August 17, 1961  Today's Date: 10/16/2016 SLP Individual Time: 0910-1005 SLP Individual Time Calculation (min): 55 min  Short Term Goals: Week 3: SLP Short Term Goal 1 (Week 3): Patient will recall new, daily information with supervision verbal cues and use of external memory aids.  SLP Short Term Goal 2 (Week 3): Patient will demonstrate complex problem solving for familiar tasks with supervision verbal cues.  SLP Short Term Goal 3 (Week 3): Patient will demonstrate selective attention to task in a moderately disctracting enviornment with supervision verbal cues for redirection for 60 minutes.  SLP Short Term Goal 4 (Week 3): Patient will self-monitor and correct errors during functional tasks with supervision verbal cues.   Skilled Therapeutic Interventions: Skilled treatment session focused on cognitive goals. SLP facilitated session by re-administering the MoCA (version 7.2). Patient scored 28/30 points with a score of 26 of above considered normal. Patient continues to demonstrate mild deficits in short-term recall. Patient and her friend/caregiver were provided education in regards to her current cognitive function and strategies to utilize at home to maximize recall and overall safety. Handouts were also given to reinforce information. Patient and her friend/cargiver verbalized and demonstrated understanding of all information. Patient left upright in wheelchair. Continue with current plan of care.    Function:  Cognition Comprehension Comprehension assist level: Follows basic conversation/direction with extra time/assistive device  Expression   Expression assist level: Expresses complex 90% of the time/cues < 10% of the time  Social Interaction Social Interaction assist level: Interacts appropriately with others with medication or extra time (anti-anxiety, antidepressant).   Problem Solving Problem solving assist level: Solves complex 90% of the time/cues < 10% of the time  Memory Memory assist level: Recognizes or recalls 90% of the time/requires cueing < 10% of the time    Pain No/Denies Pain   Therapy/Group: Individual Therapy  Kelsey Smith 10/16/2016, 3:11 PM

## 2016-10-17 MED ORDER — TRAZODONE HCL 50 MG PO TABS: 25.0000 mg | ORAL_TABLET | Freq: Every evening | ORAL | 0 refills | Status: DC | PRN

## 2016-10-17 MED ORDER — ADULT MULTIVITAMIN W/MINERALS CH: 1.0000 | ORAL_TABLET | Freq: Every day | ORAL | 0 refills | Status: DC

## 2016-10-17 MED ORDER — SENNOSIDES-DOCUSATE SODIUM 8.6-50 MG PO TABS: 2.0000 | ORAL_TABLET | Freq: Two times a day (BID) | ORAL | 0 refills | Status: DC

## 2016-10-17 MED ORDER — ATORVASTATIN CALCIUM 10 MG PO TABS: 10.0000 mg | ORAL_TABLET | Freq: Every day | ORAL | 0 refills | Status: DC

## 2016-10-17 MED ORDER — LISINOPRIL 20 MG PO TABS: 20.0000 mg | ORAL_TABLET | Freq: Every day | ORAL | 0 refills | Status: DC

## 2016-10-17 MED ORDER — BROMOCRIPTINE MESYLATE 2.5 MG PO TABS: 2.5000 mg | ORAL_TABLET | Freq: Two times a day (BID) | ORAL | 0 refills | Status: DC

## 2016-10-17 MED ORDER — POTASSIUM CHLORIDE CRYS ER 20 MEQ PO TBCR: 20.0000 meq | EXTENDED_RELEASE_TABLET | Freq: Every day | ORAL | 0 refills | Status: DC

## 2016-10-17 MED ORDER — BUSPIRONE HCL 10 MG PO TABS: 10.0000 mg | ORAL_TABLET | Freq: Every morning | ORAL | 0 refills | Status: DC

## 2016-10-17 MED ORDER — PAROXETINE HCL 20 MG PO TABS: 20.0000 mg | ORAL_TABLET | Freq: Every morning | ORAL | 0 refills | Status: DC

## 2016-10-17 MED ORDER — THIAMINE HCL 100 MG PO TABS: 100.0000 mg | ORAL_TABLET | Freq: Every day | ORAL | 0 refills | Status: DC

## 2016-10-17 MED ORDER — CYANOCOBALAMIN 1000 MCG PO TABS: 1000.0000 ug | ORAL_TABLET | Freq: Every day | ORAL | 0 refills | Status: DC

## 2016-10-17 MED ORDER — FOLIC ACID 1 MG PO TABS: 1.0000 mg | ORAL_TABLET | Freq: Every day | ORAL | 0 refills | Status: DC

## 2016-10-17 MED ORDER — PANTOPRAZOLE SODIUM 40 MG PO TBEC: 40.0000 mg | DELAYED_RELEASE_TABLET | Freq: Every day | ORAL | 0 refills | Status: DC

## 2016-10-17 NOTE — Discharge Instructions (Signed)
Inpatient Rehab Discharge Instructions  [name] Discharge date and time:  10/17/16  Activities/Precautions/ Functional Status: Activity: no lifting, driving, or strenuous exercise till cleared by MD Diet: cardiac diet Wound Care:   Functional status:  ___ No restrictions     ___ Walk up steps independently _X__ 24/7 supervision/assistance   ___ Walk up steps with assistance ___ Intermittent supervision/assistance  ___ Bathe/dress independently ___ Walk with walker     _X__ Bathe/dress with supervision ___ Walk Independently    ___ Shower independently _X__ Walk with supervision    ___ Shower with assistance _X__ No alcohol/cocaine    ___ Return to work/school ________    COMMUNITY REFERRALS UPON DISCHARGE:    Home Health:   PT     OT     ST     SW                   Agency:  [facility] Phone: [phone]   Medical Equipment/Items Ordered: wheelchair, cushion, walker, tub seat                                                     Agency/Supplier: [facility] [phone]   GENERAL COMMUNITY RESOURCES FOR PATIENT/FAMILY:  Support Groups: handout provided     Special Instructions: 1. No driving till cleared by MD. 2. Needs assistance with medication management and financial issues.  3. Cannot be left alone due to safety concerns.   My questions have been answered and I understand these instructions. I will adhere to these goals and the provided educational materials after my discharge from the hospital.  Patient/Caregiver Signature _______________________________ Date __________  Clinician Signature _______________________________________ Date __________  Please bring this form and your medication list with you to all your follow-up doctor's appointments.

## 2016-10-17 NOTE — Progress Notes (Signed)
Speech Language Pathology Discharge Summary  Patient Details  Name: [place] L Fore MRN: [id number] Date of Birth: 1961-01-10  Patient has met 4 of 4 long term goals.  Patient to discharge at overall Supervision level.   Reasons goals not met: N/A   Clinical Impression/Discharge Summary: Patient has made excellent gains and has met 4 of 4 LTG's this admission. Currently, patient requires overall supervision to complete functional and familiar tasks safely in regards to problem solving, recall with use of external aids, selective attention and emergent awareness. Patient and caregiver education is complete and patient will discharge home with 24 hour supervision from friends. Patient would benefit from f/u SLP services to maximize her cognitive function and overall functional independence in order to reduce caregiver burden.   Care Partner:  Caregiver Able to Provide Assistance: Yes  Type of Caregiver Assistance: Physical;Cognitive  Recommendation:  Home Health SLP;24 hour supervision/assistance  Rationale for SLP Follow Up: Maximize cognitive function and independence;Reduce caregiver burden   Equipment: N/A   Reasons for discharge: Discharged from hospital;Treatment goals met   Patient/Family Agrees with Progress Made and Goals Achieved: Yes     Jacqeline Broers 10/17/2016, 7:04 AM

## 2016-10-17 NOTE — Progress Notes (Signed)
[facility] PHYSICAL MEDICINE & REHABILITATION     PROGRESS NOTE    Subjective/Complaints: No new issues. ver yexcited to go home  ROS: pt denies nausea, vomiting, diarrhea, cough, shortness of breath or chest pain      Objective: Vital Signs: Blood pressure 116/80, pulse 70, temperature 98.6 F (37 C), temperature source Oral, resp. rate 17, weight 52 kg (114 lb 10.2 oz), SpO2 100 %. No results found. No results for input(s): WBC, HGB, HCT, PLT in the last 72 hours.  Recent Labs  10/15/16 0816  NA 137  K 4.1  CL 104  GLUCOSE 133*  BUN 16  CREATININE 0.81  CALCIUM 9.3   CBG (last 3)  No results for input(s): GLUCAP in the last 72 hours.  Wt Readings from Last 3 Encounters:  10/13/16 52 kg (114 lb 10.2 oz)  09/24/16 47.1 kg (103 lb 13.4 oz)    Physical Exam:  Constitutional: She appears well-developed, well-nourished.  HENT: Normocephalic and atraumatic.  Eyes: EOMI. No discharge.  Cardiovascular: RRR. Respiratory: clear bilaterally to ausc GI: BS+  Musculoskeletal: She exhibits no edema or tenderness Neurological:  Left central 7 resolving. Minimal right sided sensory loss Motor: RUE 4+ to 5/5 proximal to distal. RUE with decreasing ataxia LUE 5/5 RLE 4-/5 HF, 4+/5 distally--stable   LLE   5/5. Skin: Skin is warm and dry. Ulcer medial right knee healing  Psychiatric: pleasant and engaging.   Assessment/Plan: 1. Functional and mobility deficits secondary to left thalamic ICH which require 3+ hours per day of interdisciplinary therapy in a comprehensive inpatient rehab setting. Physiatrist is providing close team supervision and 24 hour management of active medical problems listed below. Physiatrist and rehab team continue to assess barriers to discharge/monitor patient progress toward functional and medical goals.  Function:  Bathing Bathing position   Position: Shower  Bathing parts Body parts bathed by patient: Right arm, Chest, Abdomen, Front  perineal area, Right upper leg, Left upper leg, Left arm, Left lower leg, Right lower leg, Buttocks, Back Body parts bathed by helper: Buttocks, Back  Bathing assist Assist Level: Supervision or verbal cues      Upper Body Dressing/Undressing Upper body dressing   What is the patient wearing?: Pull over shirt/dress     Pull over shirt/dress - Perfomed by patient: Thread/unthread right sleeve, Thread/unthread left sleeve, Put head through opening, Pull shirt over trunk          Upper body assist Assist Level: No help, No cues      Lower Body Dressing/Undressing Lower body dressing   What is the patient wearing?: Underwear, Pants, Non-skid slipper socks, Shoes Underwear - Performed by patient: Thread/unthread right underwear leg, Thread/unthread left underwear leg, Pull underwear up/down Underwear - Performed by helper: Pull underwear up/down Pants- Performed by patient: Thread/unthread right pants leg, Thread/unthread left pants leg, Pull pants up/down Pants- Performed by helper: Fasten/unfasten pants Non-skid slipper socks- Performed by patient: Don/doff right sock, Don/doff left sock Non-skid slipper socks- Performed by helper: Don/doff right sock, Don/doff left sock     Shoes - Performed by patient: Don/doff right shoe, Don/doff left shoe, Fasten right, Fasten left Shoes - Performed by helper: Don/doff right shoe, Don/doff left shoe, Fasten right, Fasten left          Lower body assist Assist for lower body dressing: Supervision or verbal cues      Toileting Toileting   Toileting steps completed by patient: Adjust clothing prior to toileting, Adjust clothing after toileting, Performs  perineal hygiene Toileting steps completed by helper: Adjust clothing prior to toileting Toileting Assistive Devices: Other (comment) (RW)  Toileting assist Assist level: More than reasonable time   Transfers Chair/bed transfer   Chair/bed transfer method: Ambulatory Chair/bed transfer  assist level: Supervision or verbal cues Chair/bed transfer assistive device: Medical sales representative     Max distance: >150 ft Assist level: Supervision or verbal cues   Wheelchair Wheelchair activity did not occur: N/A Type: Manual Max wheelchair distance: 150 Assist Level: Touching or steadying assistance (Pt > 75%)  Cognition Comprehension Comprehension assist level: Follows basic conversation/direction with extra time/assistive device  Expression Expression assist level: Expresses complex 90% of the time/cues < 10% of the time  Social Interaction Social Interaction assist level: Interacts appropriately with others with medication or extra time (anti-anxiety, antidepressant).  Problem Solving Problem solving assist level: Solves complex 90% of the time/cues < 10% of the time  Memory Memory assist level: Recognizes or recalls 90% of the time/requires cueing < 10% of the time   Medical Problem List and Plan: 1.  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  -home today. Goals met!  Patient to see me in the office for transitional care encounter in 1-2 weeks.  2.  DVT Prophylaxis/Anticoagulation: Pharmaceutical: Lovenox 3. Pain Management: tylenol prn.  4. Mood: LCSW to follow for evaluation and support.   -neuropsych eval appreciated  -on buspar and paxil currently  -continue  bromocriptine for attention/arousal-at 2.77m  5. Neuropsych: This patient is not fully capable of making decisions on her own behalf. 6. Right leg wound/Skin/Wound Care: wounds healing  7. Fluids/Electrolytes/Nutrition: eating well  -appreciate dietary consult  -supplementing potassium     8. HTN: On lisinopril.    -NTG paste prn. 9. Polysubstance abuse: has completed CIWA protocol. Counsel. 10. Constipation  Bowel reg increased on 1/13  Bowel movement x1 on 1/18 11. Incontinence: resolved       LOS (Days) 21 A FACE TO  [place], MD 10/17/2016 9:28 AM

## 2016-10-17 NOTE — Plan of Care (Signed)
Problem: RH BLADDER ELIMINATION Goal: RH STG MANAGE BLADDER WITH ASSISTANCE STG Manage Bladder With mod Assistance   Outcome: Progressing Pt uses walker and wheelchair for ambulating.

## 2016-10-17 NOTE — Patient Care Conference (Signed)
Inpatient RehabilitationTeam Conference and Plan of Care Update Date: 10/16/2016   Time: 2:40 PM    Patient Name: Kelsey Smith      Medical Record Number: [id number]  Date of Birth: [birth year] Sex: Female         Room/Bed: 4W25C/4W25C-01 Payor Info: Payor: MEDICAID PENDING / Plan: MEDICAID PENDING / Product Type: *No Product type* /    Admitting Diagnosis: traumatic IPH  Admit Date/Time:  09/26/2016  6:32 PM Admission Comments: No comment available   Primary Diagnosis:  Intraparenchymal hemorrhage of brain ([place]) Principal Problem: Intraparenchymal hemorrhage of brain [facility])  Patient Active Problem List   Diagnosis Date Noted  . Benign essential HTN   . Right hemiparesis ([place])   . Slow transit constipation   . Lethargy   . IVH (intraventricular hemorrhage) ([place]) 09/26/2016  . B12 deficiency 09/26/2016  . Hypertensive emergency 09/26/2016  . Essential hypertension 09/26/2016  . Carotid aneurysm, left ([place]) 09/26/2016  . Open thigh wound 09/26/2016  . Somnolence   . Recurrent major depressive disorder ([place])   . ETOH abuse   . Tobacco abuse   . Cocaine abuse   . Pure hypercholesterolemia   . Intraparenchymal hemorrhage of brain ([place]) - L thalamic 09/23/2016    Expected Discharge Date: Expected Discharge Date: 10/17/16  Team Members Present: Physician leading conference: Dr. Alger Simons Social Worker Present: Lennart Pall, LCSW Nurse Present: Dorien Chihuahua, RN PT Present: Lavone Nian, PT;Other (comment) Roderic Ovens, PT) OT Present: Willeen Cass, OT;Roanna Epley, COTA SLP Present: Weston Anna, SLP PPS Coordinator present : Daiva Nakayama, RN, CRRN     Current Status/Progress Goal Weekly Team Focus  Medical   doing extremely well. reducing meds. continent of bowel and bladder now  improve cognition/safety  nutrition, med adjustment, prepare for home   Bowel/Bladder   Cont of bowel and bladder. LBM 1/19, occasional incont episodes with peri pad in place  manage  bowel and bladder with min assist  monitor for I/O and frequency   Swallow/Nutrition/ Hydration             ADL's   BADLs-supervision, functional transfers-supervisoin, occasionally impulsive  supervision/mod I overall (toilet transfers and toileting upgraded to mod I)  safety awareness, education, functional transfers, functional amb for home mgmt tasks   Mobility   supervision overall with RW, impaired safety awareness  supervision overall  pt education, transfers, neuro re-ed, balance, gait, stair negotiation   Communication             Safety/Cognition/ Behavioral Observations            Pain   no c/o pain         Skin   Reopening of R knee abrasion with some min drainage, allevyn in place  keep wound free of infection min assist  Monitor and assess q shift and PRN      *See Care Plan and progress notes for long and short-term goals.  Barriers to Discharge: impulsivity    Possible Resolutions to Barriers:  continued education, supervision of roommates, nearing mod I level now    Discharge Planning/Teaching Needs:  Plan home with roomates who have committed to providing 24/7 assistance.  Education completed with roomate today   Team Discussion:  Has met goals and ready for d/c tomorrow. Education completed.  Revisions to Treatment Plan:  NA   Continued Need for Acute Rehabilitation Level of Care: The patient requires daily medical management by a physician with specialized training in physical medicine and  rehabilitation for the following conditions: Daily direction of a multidisciplinary physical rehabilitation program to ensure safe treatment while eliciting the highest outcome that is of practical value to the patient.: Yes Daily medical management of patient stability for increased activity during participation in an intensive rehabilitation regime.: Yes Daily analysis of laboratory values and/or radiology reports with any subsequent need for medication adjustment of  medical intervention for : Neurological problems;Mood/behavior problems;Nutritional problems  Kelsey Smith 10/17/2016, 10:26 AM

## 2016-10-17 NOTE — Progress Notes (Signed)
Social Work  Discharge Note  The overall goal for the admission was met for:   Discharge location: Yes - home with roomates who have promised they will provide 24/7 assistance.  Length of Stay: Yes - 21 days  Discharge activity level: Yes - supervision  Home/community participation: Yes  Services provided included: MD, RD, PT, OT, SLP, RN, TR, Pharmacy, Neuropsych and SW  Financial Services:  Pt with no insurance upon admit. Medicaid and SSD applications taken while here.  Follow-up services arranged: Home Health: PT, OT, ST, SW via [place], DME: 16x16 lightweight w/c, cushion, rw, tub seat via [place], Other: Referred pt to [facility] medication assist program and Patient/Family has no preference for HH/DME agencies  Comments (or additional information):  Pt aware and agreeable with this SW referring her to [place] to establish primary medical care.  I was instructed to contact center at beginning of next week to schedule an appointment.  Pt (and roommate) aware that I will contact them with appt information as it is available. Also, discussed with pt and provided her with written information on local SA resources including AA, support groups, outpatient and inpatient treatment programs.  Patient/Family verbalized understanding of follow-up arrangements: Yes  Individual responsible for coordination of the follow-up plan: pt  Confirmed correct DME delivered: Kelsey Smith 10/17/2016    Kelsey Smith

## 2016-10-17 NOTE — Progress Notes (Signed)
[facility], all questions answered. All patient's belongings packed by friend and equipment taken with patient. Patient taken to car via wheelchair and assistance by [place], [place]. [place]

## 2016-10-17 NOTE — Plan of Care (Signed)
Problem: RH BOWEL ELIMINATION Goal: RH STG MANAGE BOWEL WITH ASSISTANCE STG Manage Bowel with mod Assistance.   Outcome: Progressing Pt states she is not constipated and has refused her laxative and stool softener.  Pt states she will have a bm at home if she does not here. Pt looking forward to going home tomorrow [phone](wednesday).

## 2016-10-17 NOTE — Progress Notes (Signed)
Occupational Therapy Discharge Summary  Patient Details  Name: Kelsey Smith MRN: [id number] Date of Birth: Sep 03, 1961  Patient has met 3 of 101 long term goals due to improved activity tolerance, improved balance, postural control, ability to compensate for deficits, functional use of  RIGHT upper and RIGHT lower extremity, improved attention, improved awareness and improved coordination.  Pt made steady progress with BADLs and IADLs during this admission. Pt is completes bathing/dressing tasks and tub transfers at supervision level. Pt is mod I for toilet transfers and toileting tasks.  Pt requires supervision when performing IADLs (kitchen tasks). Pt's roommate has been present for therapy and has verbalized understanding of recommendation for 24 hour supervision. Patient to discharge at overall Supervision level.  Patient's care partner is independent to provide the necessary physical and cognitive assistance at discharge.      Recommendation:  Patient will benefit from ongoing skilled OT services in home health setting to continue to advance functional skills in the area of BADL, iADL, Vocation and Reduce care partner burden.  Equipment: shower seat  Reasons for discharge: treatment goals met and discharge from hospital  Patient/family agrees with progress made and goals achieved: Yes  OT Discharge Vision/Perception  Vision- History Baseline Vision/History: No visual deficits Patient Visual Report: No change from baseline  Cognition Overall Cognitive Status: Impaired/Different from baseline Arousal/Alertness: Awake/alert Orientation Level: Oriented X4 Attention: Sustained Sustained Attention: Impaired Sustained Attention Impairment: Functional basic;Verbal basic Memory: Impaired Memory Impairment: Decreased recall of new information;Decreased short term memory Awareness: Impaired Awareness Impairment: Anticipatory impairment Problem Solving: Impaired Problem Solving  Impairment: Verbal basic;Functional basic Behaviors: Impulsive Safety/Judgment: Impaired Sensation Sensation Light Touch: Appears Intact Hot/Cold: Appears Intact Proprioception: Appears Intact Proprioception Impaired Details: Impaired RUE Coordination Gross Motor Movements are Fluid and Coordinated: No Fine Motor Movements are Fluid and Coordinated: No Motor  Motor Motor - Skilled Clinical Observations: R hemiplegia, general weakness    Trunk/Postural Assessment  Cervical Assessment Cervical Assessment: Within Functional Limits Thoracic Assessment Thoracic Assessment: Within Functional Limits Lumbar Assessment Lumbar Assessment: Within Functional Limits Postural Control Postural Control: Within Functional Limits  Balance Dynamic Sitting Balance Dynamic Sitting - Balance Support: During functional activity Dynamic Sitting - Level of Assistance: 6: Modified independent (Device/Increase time) Extremity/Trunk Assessment RUE Assessment RUE Assessment: Within Functional Limits LUE Assessment LUE Assessment: Within Functional Limits   See Function Navigator for Current Functional Status.  Leotis Shames [facility] 10/17/2016, 6:42 AM

## 2016-10-18 ENCOUNTER — Telehealth: Payer: Self-pay

## 2016-10-18 NOTE — Telephone Encounter (Signed)
Patient has paper Rx for all meds. She is supposed to take it to pharmacy of choice

## 2016-10-18 NOTE — Telephone Encounter (Signed)
[facility], states went onsite at her home for patient care today and was informed that this patient has not received any of her medications that she was discharged from the hospital with except for her trazadone, B-12, and Senokot.  States pharmacy stated that they never received a prescription for the rest of the medication.   Please advise.

## 2016-10-19 MED ORDER — POTASSIUM CHLORIDE CRYS ER 20 MEQ PO TBCR: 20.0000 meq | EXTENDED_RELEASE_TABLET | Freq: Every day | ORAL | 0 refills | Status: DC

## 2016-10-19 MED ORDER — BROMOCRIPTINE MESYLATE 2.5 MG PO TABS: 2.5000 mg | ORAL_TABLET | Freq: Two times a day (BID) | ORAL | 0 refills | Status: DC

## 2016-10-19 MED ORDER — PANTOPRAZOLE SODIUM 40 MG PO TBEC: 40.0000 mg | DELAYED_RELEASE_TABLET | Freq: Every day | ORAL | 0 refills | Status: DC

## 2016-10-19 MED ORDER — PAROXETINE HCL 20 MG PO TABS: 20.0000 mg | ORAL_TABLET | Freq: Every morning | ORAL | 0 refills | Status: DC

## 2016-10-19 MED ORDER — THIAMINE HCL 100 MG PO TABS: 100.0000 mg | ORAL_TABLET | Freq: Every day | ORAL | 0 refills | Status: DC

## 2016-10-19 MED ORDER — FOLIC ACID 1 MG PO TABS: 1.0000 mg | ORAL_TABLET | Freq: Every day | ORAL | 0 refills | Status: DC

## 2016-10-19 MED ORDER — BUSPIRONE HCL 10 MG PO TABS: 10.0000 mg | ORAL_TABLET | Freq: Every morning | ORAL | 0 refills | Status: DC

## 2016-10-19 MED ORDER — ADULT MULTIVITAMIN W/MINERALS CH: 1.0000 | ORAL_TABLET | Freq: Every day | ORAL | 0 refills | Status: DC

## 2016-10-19 MED ORDER — LISINOPRIL 20 MG PO TABS: 20.0000 mg | ORAL_TABLET | Freq: Every day | ORAL | 0 refills | Status: DC

## 2016-10-19 MED ORDER — ATORVASTATIN CALCIUM 10 MG PO TABS: 10.0000 mg | ORAL_TABLET | Freq: Every day | ORAL | 0 refills | Status: DC

## 2016-10-19 NOTE — Telephone Encounter (Signed)
Was contacted from [place] today, states patient had lost all paperwork from the hospital including the paper prescriptions, contacted PA love at the hospital and [facility], contacted patient/friend and was informed that pharmacy listed is the correct pharmacy, escribed ALL meds on the discharge note written by PA love.

## 2016-10-19 NOTE — Telephone Encounter (Signed)
[facility] and informed her of the doctors message about "paper prescriptions", she stated she will contact the patient and see if they still have that script or if she needs a new one printed.

## 2016-10-22 ENCOUNTER — Telehealth: Payer: Self-pay | Admitting: *Deleted

## 2016-10-22 NOTE — Telephone Encounter (Signed)
Kelsey Smith called and while they were out doing start of care [name] had two issues with her medication.  Her Bromocriptine is cost prohibitive ( she only got #6 because it was over $200) and needs to know what to do after she takes the #6 tablets. [facility] has ordered MSW out to assist with resources.  She also has a level 2 interaction with the paxil and trazodone that she needs to make us aware of.  I have given verbal approval of her request for ST 2wk1 1 wk2

## 2016-10-22 NOTE — Telephone Encounter (Signed)
Kelsey Smith has a question about medication.  I called her back and left message to call us back and be more specific about what she needs.

## 2016-10-22 NOTE — Telephone Encounter (Signed)
She may stop the bromocriptine once she runs out. I had already decreased it last week. I am not concerned about the paxil and trazodone at this time. I am aware of the potential interaction

## 2016-10-23 NOTE — Telephone Encounter (Signed)
Allison notified. 

## 2016-10-29 ENCOUNTER — Encounter: Payer: Self-pay | Admitting: Internal Medicine

## 2016-10-29 ENCOUNTER — Ambulatory Visit: Payer: Medicaid Other | Attending: Internal Medicine | Admitting: Internal Medicine

## 2016-10-29 VITALS — BP 113/74 | HR 77 | Temp 98.0°F | Resp 16 | Wt 122.0 lb

## 2016-10-29 DIAGNOSIS — I10 Essential (primary) hypertension: Secondary | ICD-10-CM | POA: Diagnosis not present

## 2016-10-29 DIAGNOSIS — Z1159 Encounter for screening for other viral diseases: Secondary | ICD-10-CM

## 2016-10-29 DIAGNOSIS — Z809 Family history of malignant neoplasm, unspecified: Secondary | ICD-10-CM | POA: Diagnosis not present

## 2016-10-29 DIAGNOSIS — B373 Candidiasis of vulva and vagina: Secondary | ICD-10-CM | POA: Diagnosis not present

## 2016-10-29 DIAGNOSIS — Z8673 Personal history of transient ischemic attack (TIA), and cerebral infarction without residual deficits: Secondary | ICD-10-CM | POA: Insufficient documentation

## 2016-10-29 DIAGNOSIS — B3731 Acute candidiasis of vulva and vagina: Secondary | ICD-10-CM

## 2016-10-29 DIAGNOSIS — F17200 Nicotine dependence, unspecified, uncomplicated: Secondary | ICD-10-CM | POA: Insufficient documentation

## 2016-10-29 DIAGNOSIS — F329 Major depressive disorder, single episode, unspecified: Secondary | ICD-10-CM | POA: Insufficient documentation

## 2016-10-29 DIAGNOSIS — I619 Nontraumatic intracerebral hemorrhage, unspecified: Secondary | ICD-10-CM | POA: Insufficient documentation

## 2016-10-29 DIAGNOSIS — Z820 Family history of epilepsy and other diseases of the nervous system: Secondary | ICD-10-CM | POA: Diagnosis not present

## 2016-10-29 DIAGNOSIS — Z801 Family history of malignant neoplasm of trachea, bronchus and lung: Secondary | ICD-10-CM | POA: Insufficient documentation

## 2016-10-29 LAB — CBC WITH DIFFERENTIAL/PLATELET
BASOS ABS: 0 {cells}/uL (ref 0–200)
BASOS PCT: 0 %
EOS PCT: 1 %
Eosinophils Absolute: 90 cells/uL (ref 15–500)
HCT: 34.9 % — ABNORMAL LOW (ref 35.0–45.0)
HEMOGLOBIN: 11.6 g/dL — AB (ref 11.7–15.5)
LYMPHS ABS: 2340 {cells}/uL (ref 850–3900)
Lymphocytes Relative: 26 %
MCH: 32.7 pg (ref 27.0–33.0)
MCHC: 33.2 g/dL (ref 32.0–36.0)
MCV: 98.3 fL (ref 80.0–100.0)
MONOS PCT: 8 %
MPV: 9 fL (ref 7.5–12.5)
Monocytes Absolute: 720 cells/uL (ref 200–950)
NEUTROS ABS: 5850 {cells}/uL (ref 1500–7800)
Neutrophils Relative %: 65 %
PLATELETS: 326 10*3/uL (ref 140–400)
RBC: 3.55 MIL/uL — ABNORMAL LOW (ref 3.80–5.10)
RDW: 13.2 % (ref 11.0–15.0)
WBC: 9 10*3/uL (ref 3.8–10.8)

## 2016-10-29 LAB — CMP AND LIVER
ALT: 34 U/L — ABNORMAL HIGH (ref 6–29)
AST: 29 U/L (ref 10–35)
Albumin: 4.6 g/dL (ref 3.6–5.1)
Alkaline Phosphatase: 76 U/L (ref 33–130)
BILIRUBIN DIRECT: 0.1 mg/dL (ref <=0.2)
BILIRUBIN INDIRECT: 0.2 mg/dL (ref 0.2–1.2)
BILIRUBIN TOTAL: 0.3 mg/dL (ref 0.2–1.2)
BUN: 18 mg/dL (ref 7–25)
CO2: 24 mmol/L (ref 20–31)
Calcium: 9.9 mg/dL (ref 8.6–10.4)
Chloride: 105 mmol/L (ref 98–110)
Creat: 0.74 mg/dL (ref 0.50–1.05)
GLUCOSE: 93 mg/dL (ref 65–99)
POTASSIUM: 4.7 mmol/L (ref 3.5–5.3)
Sodium: 139 mmol/L (ref 135–146)
TOTAL PROTEIN: 7.4 g/dL (ref 6.1–8.1)

## 2016-10-29 LAB — POCT URINALYSIS DIPSTICK
Bilirubin, UA: NEGATIVE
GLUCOSE UA: NEGATIVE
KETONES UA: NEGATIVE
Nitrite, UA: NEGATIVE
PROTEIN UA: NEGATIVE
SPEC GRAV UA: 1.015
Urobilinogen, UA: 0.2
pH, UA: 5.5

## 2016-10-29 MED ORDER — SULFAMETHOXAZOLE-TRIMETHOPRIM 800-160 MG PO TABS: 1.0000 | ORAL_TABLET | Freq: Two times a day (BID) | ORAL | 0 refills | Status: DC

## 2016-10-29 MED ORDER — FLUCONAZOLE 150 MG PO TABS: 150.0000 mg | ORAL_TABLET | Freq: Every day | ORAL | 0 refills | Status: DC

## 2016-10-29 MED ORDER — ATORVASTATIN CALCIUM 10 MG PO TABS: 10.0000 mg | ORAL_TABLET | Freq: Every day | ORAL | 0 refills | Status: DC

## 2016-10-29 MED ORDER — LISINOPRIL 20 MG PO TABS: 20.0000 mg | ORAL_TABLET | Freq: Every day | ORAL | 0 refills | Status: DC

## 2016-10-29 MED ORDER — NYSTATIN 100000 UNIT/GM EX POWD
Freq: Four times a day (QID) | CUTANEOUS | 0 refills | Status: DC
Start: 2016-10-29 — End: 2017-04-29

## 2016-10-29 MED ORDER — PAROXETINE HCL 20 MG PO TABS: 20.0000 mg | ORAL_TABLET | Freq: Every morning | ORAL | 0 refills | Status: DC

## 2016-10-29 MED ORDER — BUSPIRONE HCL 10 MG PO TABS: 10.0000 mg | ORAL_TABLET | Freq: Every morning | ORAL | 1 refills | Status: DC

## 2016-10-29 MED FILL — SULFAMETHOXAZOLE/TMP DS TAB: 800-160 | 5 days supply | Qty: 10 | Fill #0

## 2016-10-29 MED FILL — busPIRone HCL 10 MG TABS: 10 | 30 days supply | Qty: 30 | Fill #0

## 2016-10-29 MED FILL — NYSTOP 100,000 UNITS/GM PWD: 100000 | 7 days supply | Qty: 15 | Fill #0

## 2016-10-29 MED FILL — LISINOPRIL 20 MG TABLET: 20 | 30 days supply | Qty: 30 | Fill #0

## 2016-10-29 MED FILL — ATORVASTATIN 10 MG TABLET: 10 | 30 days supply | Qty: 30 | Fill #0

## 2016-10-29 MED FILL — PARoxetine HCL 20 MG TABS: 20 | 30 days supply | Qty: 30 | Fill #0

## 2016-10-29 MED FILL — FLUCONAZOLE 150 MG TABLET: 150 | 1 days supply | Qty: 1 | Fill #0
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
## 2016-10-29 NOTE — Patient Instructions (Addendum)
-financial aid  - Steps to Quit Smoking Smoking tobacco can be bad for your health. It can also affect almost every organ in your body. Smoking puts you and people around you at risk for many serious long-lasting (chronic) diseases. Quitting smoking is hard, but it is one of the best things that you can do for your health. It is never too late to quit. What are the benefits of quitting smoking? When you quit smoking, you lower your risk for getting serious diseases and conditions. They can include:  Lung cancer or lung disease.  Heart disease.  Stroke.  Heart attack.  Not being able to have children (infertility).  Weak bones (osteoporosis) and broken bones (fractures). If you have coughing, wheezing, and shortness of breath, those symptoms may get better when you quit. You may also get sick less often. If you are pregnant, quitting smoking can help to lower your chances of having a baby of low birth weight. What can I do to help me quit smoking? Talk with your doctor about what can help you quit smoking. Some things you can do (strategies) include:  Quitting smoking totally, instead of slowly cutting back how much you smoke over a period of time.  Going to in-person counseling. You are more likely to quit if you go to many counseling sessions.  Using resources and support systems, such as:  Online chats with a Veterinary surgeoncounselor.  Phone quitlines.  Printed Materials engineerself-help materials.  Support groups or group counseling.  Text messaging programs.  Mobile phone apps or applications.  Taking medicines. Some of these medicines may have nicotine in them. If you are pregnant or breastfeeding, do not take any medicines to quit smoking unless your doctor says it is okay. Talk with your doctor about counseling or other things that can help you. Talk with your doctor about using more than one strategy at the same time, such as taking medicines while you are also going to in-person counseling. This  can help make quitting easier. What things can I do to make it easier to quit? Quitting smoking might feel very hard at first, but there is a lot that you can do to make it easier. Take these steps:  Talk to your family and friends. Ask them to support and encourage you.  Call phone quitlines, reach out to support groups, or work with a Veterinary surgeoncounselor.  Ask people who smoke to not smoke around you.  Avoid places that make you want (trigger) to smoke, such as:  Bars.  Parties.  Smoke-break areas at work.  Spend time with people who do not smoke.  Lower the stress in your life. Stress can make you want to smoke. Try these things to help your stress:  Getting regular exercise.  Deep-breathing exercises.  Yoga.  Meditating.  Doing a body scan. To do this, close your eyes, focus on one area of your body at a time from head to toe, and notice which parts of your body are tense. Try to relax the muscles in those areas.  Download or buy apps on your mobile phone or tablet that can help you stick to your quit plan. There are many free apps, such as QuitGuide from the [employer] developer(Centers for Disease Control and Prevention). You can find more support from smokefree.gov and other websites. This information is not intended to replace advice given to you by your health care provider. Make sure you discuss any questions you have with your health care provider. Document Released: 07/07/2009  Document Revised: 05/08/2016 Document Reviewed: 01/25/2015 Elsevier Interactive Patient Education  2017 Elsevier Inc.   -  Low-Sodium Eating Plan Sodium raises blood pressure and causes water to be held in the body. Getting less sodium from food will help lower your blood pressure, reduce any swelling, and protect your heart, liver, and kidneys. We get sodium by adding salt (sodium chloride) to food. Most of our sodium comes from canned, boxed, and frozen foods. Restaurant foods, fast foods, and pizza are also very  high in sodium. Even if you take medicine to lower your blood pressure or to reduce fluid in your body, getting less sodium from your food is important. What is my plan? Most people should limit their sodium intake to 2,300 mg a day. Your health care provider recommends that you limit your sodium intake to 000mg  a day. What do I need to know about this eating plan? For the low-sodium eating plan, you will follow these general guidelines:  Choose foods with a % Daily Value for sodium of less than 5% (as listed on the food label).  Use salt-free seasonings or herbs instead of table salt or sea salt.  Check with your health care provider or pharmacist before using salt substitutes.  Eat fresh foods.  Eat more vegetables and fruits.  Limit canned vegetables. If you do use them, rinse them well to decrease the sodium.  Limit cheese to 1 oz (28 g) per day.  Eat lower-sodium products, often labeled as "lower sodium" or "no salt added."  Avoid foods that contain monosodium glutamate (MSG). MSG is sometimes added to [place] food and some canned foods.  Check food labels (Nutrition Facts labels) on foods to learn how much sodium is in one serving.  Eat more home-cooked food and less restaurant, buffet, and fast food.  When eating at a restaurant, ask that your food be prepared with less salt, or no salt if possible. How do I read food labels for sodium information? The Nutrition Facts label lists the amount of sodium in one serving of the food. If you eat more than one serving, you must multiply the listed amount of sodium by the number of servings. Food labels may also identify foods as:  Sodium free-Less than 5 mg in a serving.  Very low sodium-35 mg or less in a serving.  Low sodium-140 mg or less in a serving.  Light in sodium-50% less sodium in a serving. For example, if a food that usually has 300 mg of sodium is changed to become light in sodium, it will have 150 mg of  sodium.  Reduced sodium-25% less sodium in a serving. For example, if a food that usually has 400 mg of sodium is changed to reduced sodium, it will have 300 mg of sodium. What foods can I eat? Grains  Low-sodium cereals, including oats, puffed wheat and rice, and shredded wheat cereals. Low-sodium crackers. Unsalted rice and pasta. Lower-sodium bread. Vegetables  Frozen or fresh vegetables. Low-sodium or reduced-sodium canned vegetables. Low-sodium or reduced-sodium tomato sauce and paste. Low-sodium or reduced-sodium tomato and vegetable juices. Fruits  Fresh, frozen, and canned fruit. Fruit juice. Meat and Other Protein Products  Low-sodium canned tuna and salmon. Fresh or frozen meat, poultry, seafood, and fish. Lamb. Unsalted nuts. Dried beans, peas, and lentils without added salt. Unsalted canned beans. Homemade soups without salt. Eggs. Dairy  Milk. Soy milk. Ricotta cheese. Low-sodium or reduced-sodium cheeses. Yogurt. Condiments  Fresh and dried herbs and spices. Salt-free seasonings. Onion and  garlic powders. Low-sodium varieties of mustard and ketchup. Fresh or refrigerated horseradish. Lemon juice. Fats and Oils  Reduced-sodium salad dressings. Unsalted butter. Other  Unsalted popcorn and pretzels. The items listed above may not be a complete list of recommended foods or beverages. Contact your dietitian for more options.  What foods are not recommended? Grains  Instant hot cereals. Bread stuffing, pancake, and biscuit mixes. Croutons. Seasoned rice or pasta mixes. Noodle soup cups. Boxed or frozen macaroni and cheese. Self-rising flour. Regular salted crackers. Vegetables  Regular canned vegetables. Regular canned tomato sauce and paste. Regular tomato and vegetable juices. Frozen vegetables in sauces. Salted [place] fries. Olives. Rosita Fire. Relishes. Sauerkraut. Salsa. Meat and Other Protein Products  Salted, canned, smoked, spiced, or pickled meats, seafood, or fish. Bacon,  ham, sausage, hot dogs, corned beef, chipped beef, and packaged luncheon meats. Salt pork. Jerky. Pickled herring. Anchovies, regular canned tuna, and sardines. Salted nuts. Dairy  Processed cheese and cheese spreads. Cheese curds. Blue cheese and cottage cheese. Buttermilk. Condiments  Onion and garlic salt, seasoned salt, table salt, and sea salt. Canned and packaged gravies. Worcestershire sauce. Tartar sauce. Barbecue sauce. Teriyaki sauce. Soy sauce, including reduced sodium. Steak sauce. Fish sauce. Oyster sauce. Cocktail sauce. Horseradish that you find on the shelf. Regular ketchup and mustard. Meat flavorings and tenderizers. Bouillon cubes. Hot sauce. Tabasco sauce. Marinades. Taco seasonings. Relishes. Fats and Oils  Regular salad dressings. Salted butter. Margarine. Ghee. Bacon fat. Other  Potato and tortilla chips. Corn chips and puffs. Salted popcorn and pretzels. Canned or dried soups. Pizza. Frozen entrees and pot pies. The items listed above may not be a complete list of foods and beverages to avoid. Contact your dietitian for more information.  This information is not intended to replace advice given to you by your health care provider. Make sure you discuss any questions you have with your health care provider. Document Released: 03/02/2002 Document Revised: 02/16/2016 Document Reviewed: 07/15/2013 Elsevier Interactive Patient Education  2017 Elsevier Inc.  -  Hypertension Hypertension is another name for high blood pressure. High blood pressure forces your heart to work harder to pump blood. A blood pressure reading has two numbers, which includes a higher number over a lower number (example: 110/72). Follow these instructions at home:  Have your blood pressure rechecked by your doctor.  Only take medicine as told by your doctor. Follow the directions carefully. The medicine does not work as well if you skip doses. Skipping doses also puts you at risk for problems.  Do  not smoke.  Monitor your blood pressure at home as told by your doctor. Contact a doctor if:  You think you are having a reaction to the medicine you are taking.  You have repeat headaches or feel dizzy.  You have puffiness (swelling) in your ankles.  You have trouble with your vision. Get help right away if:  You get a very bad headache and are confused.  You feel weak, numb, or faint.  You get chest or belly (abdominal) pain.  You throw up (vomit).  You cannot breathe very well. This information is not intended to replace advice given to you by your health care provider. Make sure you discuss any questions you have with your health care provider. Document Released: 02/27/2008 Document Revised: 02/16/2016 Document Reviewed: 07/03/2013 Elsevier Interactive Patient Education  2017 [employer].  -

## 2016-10-30 LAB — URINE CYTOLOGY ANCILLARY ONLY
CHLAMYDIA, DNA PROBE: NEGATIVE
NEISSERIA GONORRHEA: NEGATIVE
TRICH (WINDOWPATH): POSITIVE — AB

## 2016-10-30 LAB — HEPATITIS C ANTIBODY: HCV AB: NEGATIVE

## 2016-10-31 ENCOUNTER — Other Ambulatory Visit: Payer: Self-pay | Admitting: Internal Medicine

## 2016-10-31 MED ORDER — METRONIDAZOLE 500 MG PO TABS: 2000.0000 mg | ORAL_TABLET | Freq: Once | ORAL | 0 refills | Status: AC

## 2016-10-31 MED FILL — ?METRONIDAZOLE 500 MG TABLE: 500 | 1 days supply | Qty: 4 | Fill #0

## 2016-11-01 ENCOUNTER — Telehealth: Payer: Self-pay

## 2016-11-01 ENCOUNTER — Other Ambulatory Visit: Payer: Self-pay

## 2016-11-01 ENCOUNTER — Telehealth: Payer: Self-pay | Admitting: Internal Medicine

## 2016-11-01 LAB — URINE CYTOLOGY ANCILLARY ONLY
BACTERIAL VAGINITIS: NEGATIVE
CANDIDA VAGINITIS: NEGATIVE

## 2016-11-01 MED ORDER — METRONIDAZOLE 500 MG PO TABS: ORAL_TABLET | ORAL | 0 refills | Status: DC

## 2016-11-01 NOTE — Telephone Encounter (Signed)
Pt is aware of results. 

## 2016-11-01 NOTE — Telephone Encounter (Signed)
Patient returned nurse's call regarding her results. Please follow up. ° °Thank you.  °

## 2016-11-01 NOTE — Telephone Encounter (Signed)
Returned pt call pt is aware of results and doesn't have any questions or concerns 

## 2016-11-06 ENCOUNTER — Encounter: Payer: Medicaid Other | Attending: Physical Medicine & Rehabilitation | Admitting: Physical Medicine & Rehabilitation

## 2016-11-06 ENCOUNTER — Encounter: Payer: Self-pay | Admitting: Physical Medicine & Rehabilitation

## 2016-11-06 VITALS — BP 101/71 | HR 79 | Resp 14

## 2016-11-06 DIAGNOSIS — F329 Major depressive disorder, single episode, unspecified: Secondary | ICD-10-CM | POA: Insufficient documentation

## 2016-11-06 DIAGNOSIS — I619 Nontraumatic intracerebral hemorrhage, unspecified: Secondary | ICD-10-CM

## 2016-11-06 DIAGNOSIS — F17200 Nicotine dependence, unspecified, uncomplicated: Secondary | ICD-10-CM | POA: Insufficient documentation

## 2016-11-06 DIAGNOSIS — Z820 Family history of epilepsy and other diseases of the nervous system: Secondary | ICD-10-CM | POA: Diagnosis not present

## 2016-11-06 DIAGNOSIS — Z801 Family history of malignant neoplasm of trachea, bronchus and lung: Secondary | ICD-10-CM | POA: Insufficient documentation

## 2016-11-06 DIAGNOSIS — G8191 Hemiplegia, unspecified affecting right dominant side: Secondary | ICD-10-CM

## 2016-11-06 DIAGNOSIS — Z809 Family history of malignant neoplasm, unspecified: Secondary | ICD-10-CM | POA: Diagnosis not present

## 2016-11-06 DIAGNOSIS — I10 Essential (primary) hypertension: Secondary | ICD-10-CM | POA: Insufficient documentation

## 2016-11-06 DIAGNOSIS — F419 Anxiety disorder, unspecified: Secondary | ICD-10-CM | POA: Insufficient documentation

## 2016-11-06 NOTE — Progress Notes (Signed)
Subjective:    Patient ID: Kelsey Smith, female    DOB: Jul 30, 1961, 56 y.o.   MRN: [id number]  HPI She is back regarding her thalamic hemorrhage. She has been home for about 3 weeks. She reports that things are going fairly well. She has fatigue still some times some mental "lapses" when she does too much. She is also having pain in her right arm and leg. It hurts to lift objects which should otherwise not be problems. She is using her left arm more to compensate. She describes it as an "achy" pain more than a "pins and needles" sensation. It also bothers her left side too but to a lesser extent.   She has started going to AA to stay off etoh and drugs.   She previously has worked as a Producer, television/film/video and would like to get back to that eventually. She also would like return to driving.  It sounds as if her roommate has been supportive and has helped with meds, transportation, etc.   Pain Inventory Average Pain 7 Pain Right Now 7 My pain is aching  In the last 24 hours, has pain interfered with the following? General activity 5 Relation with others 0 Enjoyment of life 1 What TIME of day is your pain at its worst? daytime Sleep (in general) Fair  Pain is worse with: unsure Pain improves with: na Relief from Meds: 5  Mobility walk without assistance ability to climb steps?  yes do you drive?  no  Function not employed: date last employed 9/17  Neuro/Psych weakness trouble walking confusion depression  Prior Studies Any changes since last visit?  no  Physicians involved in your care Any changes since last visit?  no   Family History  Problem Relation Age of Onset  . Lung cancer Mother   . Dementia Father   . Cancer Brother    Social History   Social History  . Marital status: Single    Spouse name: N/A  . Number of children: N/A  . Years of education: N/A   Social History Main Topics  . Smoking status: Current Every Day Smoker  . Smokeless tobacco:  Never Used  . Alcohol use Yes  . Drug use: No  . Sexual activity: Not Asked   Other Topics Concern  . None   Social History Narrative  . None   No past surgical history on file. Past Medical History:  Diagnosis Date  . Anxiety   . Depression    BP 101/71   Pulse 79   Resp 14   SpO2 98%   Opioid Risk Score:   Fall Risk Score:  `1  Depression screen PHQ 2/9  Depression screen [facility] 2/9 11/06/2016 10/29/2016  Decreased Interest 0 0  Down, Depressed, Hopeless 0 0  PHQ - 2 Score 0 0    Review of Systems  Eyes: Negative.   Respiratory: Negative.   Cardiovascular: Negative.   Gastrointestinal: Negative.   Endocrine: Negative.   Genitourinary: Negative.   Musculoskeletal: Positive for gait problem.  Skin: Negative.   Allergic/Immunologic: Negative.   Neurological: Positive for weakness.  Hematological: Negative.   Psychiatric/Behavioral: Positive for confusion and dysphoric mood.       On medication for depression  All other systems reviewed and are negative.      Objective:   Physical Exam Constitutional: She appears well-developed, well-nourished.  HENT: Normocephalic and atraumatic.  Eyes: EOMI. No discharge.  Cardiovascular: RRR. Respiratory: CTA B GI:  BS+  Musculoskeletal: She exhibits no edema or tenderness Neurological:  Left central 7 resolved. Mild right sided sensory loss. Still has decreased proprioception which is evidenced in gait Motor: RUE  5/5 proximal to distal. RUE without ataxia--no pronator drift. No attention deficits LUE 5/5 RLE 5/5 prox to distal.    LLE   5/5. Ambulates with slight list to right. No risk of falling. Changes directions without issues Attention fair although can be distracted. Reasonable insight and awareness.  Skin: Skin is warm and dry. Ulcer medial right knee healing  Psychiatric: pleasant. A little anxious        Assessment & Plan:  Medical Problem List and Plan: 1. 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             -finishing HH therapies tomorrow  - HEP  -consider outpt therapies at some point  -Discussed driving. Gave her a return to driving protocol to follow. Local driving only  -reviewed cognition and discussed a plan to potentiate memory and organization 3. Right > left arm/leg pain--might be residual from thalamic hemorrhage  -could be related to her statin at on exam there is muscular tenderness and relative fatigue reported too in these muscles.   -recommended holding statin for now and observe 4. Mood:              -on buspar and paxil currently  -appears to have a good outlook at present 5. Polysubstance abuse: continue AA  -discussed the potential health sequelae of continued substance abuse  -the patient appears quite sincere about her recovery and rehab  -will follow along for now               foLLOW UP IN 2 MONTHS. Thirty minutes of face to face patient care time were spent during this visit. All questions were encouraged and answered. Greater than 50% of time during this encounter was spent counseling patient/family in regard to her stroke recovery, cognitve deficits, substance abuse.

## 2016-11-06 NOTE — Patient Instructions (Addendum)
RETURN TO DRIVING PLAN:  WITH THE SUPERVISION OF A LICENSED DRIVER, PLEASE DRIVE IN AN EMPTY PARKING LOT FOR AT LEAST 2-3 TRIALS TO TEST REACTION TIME, VISION, USE OF EQUIPMENT IN CAR, ETC.  IF SUCCESSFUL WITH THE PARKING LOT DRIVING, PROCEED TO SUPERVISED DRIVING TRIALS IN YOUR NEIGHBORHOOD STREETS AT LOW TRAFFIC TIMES TO TEST OBSERVATION TO TRAFFIC SIGNALS, REACTION TIME, ETC. PLEASE ATTEMPT AT LEAST 2-3 TRIALS IN YOUR NEIGHBORHOOD.  IF NEIGHBORHOOD DRIVING IS SUCCESSFUL, YOU MAY PROCEED TO DRIVING IN BUSIER AREAS IN YOUR COMMUNITY WITH SUPERVISION OF A LICENSED DRIVER. PLEASE ATTEMPT AT LEAST 4-5 TRIALS.  IF COMMUNITY DRIVING IS SUCCESSFUL, YOU MAY PROCEED TO DRIVING ALONE, DURING THE DAY TIME, IN NON-PEAK TRAFFIC TIMES. YOU SHOULD DRIVE NO FURTHER THAN 30 MINUTES IN ONE DIRECTION. PLEASE DO NOT DRIVE IF YOU FEEL FATIGUED OR UNDER THE INFLUENCE OF MEDICATION.    HOLD LIPITOR  TO IMPROVE YOUR COGNITION:  REDUCE DISTRACTIONS KEEP A ROUTINE DO THINGS ONE AT A TIME/ MAKE LISTS KEEP A CALENDAR 8-10 HOURS OF SLEEP PER NIGHT

## 2016-11-12 DIAGNOSIS — R2689 Other abnormalities of gait and mobility: Secondary | ICD-10-CM

## 2016-11-12 DIAGNOSIS — M6281 Muscle weakness (generalized): Secondary | ICD-10-CM

## 2016-11-12 DIAGNOSIS — I69398 Other sequelae of cerebral infarction: Secondary | ICD-10-CM

## 2016-11-12 DIAGNOSIS — I110 Hypertensive heart disease with heart failure: Secondary | ICD-10-CM

## 2016-11-13 ENCOUNTER — Other Ambulatory Visit: Payer: Self-pay

## 2016-11-13 NOTE — Telephone Encounter (Signed)
[facility] called, stating patient requested a refill of this medication, trazodone, no mention of continuing this medication on last note, please advise

## 2016-11-21 ENCOUNTER — Other Ambulatory Visit: Payer: Self-pay | Admitting: Internal Medicine

## 2016-11-21 ENCOUNTER — Telehealth: Payer: Self-pay | Admitting: Internal Medicine

## 2016-11-21 MED ORDER — POTASSIUM CHLORIDE CRYS ER 20 MEQ PO TBCR: 20.0000 meq | EXTENDED_RELEASE_TABLET | Freq: Every day | ORAL | 0 refills | Status: DC

## 2016-11-21 MED ORDER — TRAZODONE HCL 50 MG PO TABS: 25.0000 mg | ORAL_TABLET | Freq: Every evening | ORAL | 0 refills | Status: DC | PRN

## 2016-11-21 MED ORDER — PANTOPRAZOLE SODIUM 40 MG PO TBEC: 40.0000 mg | DELAYED_RELEASE_TABLET | Freq: Every day | ORAL | 2 refills | Status: DC

## 2016-11-21 MED ORDER — LISINOPRIL 20 MG PO TABS: 20.0000 mg | ORAL_TABLET | Freq: Every day | ORAL | 3 refills | Status: DC

## 2016-11-21 NOTE — Telephone Encounter (Signed)
Requested medications refilled 

## 2016-11-21 NOTE — Telephone Encounter (Signed)
Pt. Called stating that the doctor in the hospital told her to stop taking Lipitor. Pt. States because her joints were hurting. Pt. Does not know is she needs to  Be taking the medication. Please f/u

## 2016-11-21 NOTE — Telephone Encounter (Signed)
Pt. Called requesting a refill on the following medication:  pantoprazole (PROTONIX) 40 MG tablet   traZODone (DESYREL) 50 MG tablet  potassium chloride SA (K-DUR,KLOR-CON) 20 MEQ tablet  Pt. [facility] to [facility] on [address]. Please f/u

## 2016-11-21 NOTE — Telephone Encounter (Signed)
I renewed lisinopril, dc atorvastatin for now. thanks

## 2016-11-21 NOTE — Telephone Encounter (Signed)
Contacted pt to make aware that Dr. Julien Nordmannlangeland renewed lisinopril and she dc the atorvastatin for now

## 2016-11-21 NOTE — Telephone Encounter (Signed)
Will forward to pcp

## 2016-11-28 ENCOUNTER — Encounter: Payer: Self-pay | Admitting: Internal Medicine

## 2016-11-28 ENCOUNTER — Ambulatory Visit: Payer: Medicaid Other | Attending: Internal Medicine | Admitting: Internal Medicine

## 2016-11-28 VITALS — BP 98/67 | HR 77 | Temp 98.1°F | Resp 16 | Wt 126.6 lb

## 2016-11-28 DIAGNOSIS — Z01419 Encounter for gynecological examination (general) (routine) without abnormal findings: Secondary | ICD-10-CM | POA: Diagnosis not present

## 2016-11-28 DIAGNOSIS — F339 Major depressive disorder, recurrent, unspecified: Secondary | ICD-10-CM

## 2016-11-28 DIAGNOSIS — F419 Anxiety disorder, unspecified: Secondary | ICD-10-CM | POA: Insufficient documentation

## 2016-11-28 DIAGNOSIS — F1010 Alcohol abuse, uncomplicated: Secondary | ICD-10-CM | POA: Insufficient documentation

## 2016-11-28 DIAGNOSIS — Z124 Encounter for screening for malignant neoplasm of cervix: Secondary | ICD-10-CM

## 2016-11-28 DIAGNOSIS — F329 Major depressive disorder, single episode, unspecified: Secondary | ICD-10-CM | POA: Insufficient documentation

## 2016-11-28 DIAGNOSIS — K219 Gastro-esophageal reflux disease without esophagitis: Secondary | ICD-10-CM | POA: Diagnosis not present

## 2016-11-28 DIAGNOSIS — G4700 Insomnia, unspecified: Secondary | ICD-10-CM | POA: Insufficient documentation

## 2016-11-28 DIAGNOSIS — Z1231 Encounter for screening mammogram for malignant neoplasm of breast: Secondary | ICD-10-CM

## 2016-11-28 DIAGNOSIS — Z1239 Encounter for other screening for malignant neoplasm of breast: Secondary | ICD-10-CM

## 2016-11-28 DIAGNOSIS — I10 Essential (primary) hypertension: Secondary | ICD-10-CM

## 2016-11-28 MED ORDER — LISINOPRIL 10 MG PO TABS: 10.0000 mg | ORAL_TABLET | Freq: Every day | ORAL | 3 refills | Status: DC

## 2016-11-28 MED ORDER — PAROXETINE HCL 20 MG PO TABS: 20.0000 mg | ORAL_TABLET | Freq: Every morning | ORAL | 3 refills | Status: DC

## 2016-11-28 MED ORDER — BUSPIRONE HCL 10 MG PO TABS: 10.0000 mg | ORAL_TABLET | Freq: Every morning | ORAL | 3 refills | Status: DC

## 2016-11-28 MED ORDER — AMITRIPTYLINE HCL 25 MG PO TABS
25.0000 mg | ORAL_TABLET | Freq: Every evening | ORAL | 3 refills | Status: DC | PRN
Start: 2016-11-28 — End: 2017-02-28

## 2016-11-28 MED ORDER — FAMOTIDINE 20 MG PO TABS: 20.0000 mg | ORAL_TABLET | Freq: Two times a day (BID) | ORAL | 1 refills | Status: DC

## 2016-11-28 NOTE — Patient Instructions (Addendum)
- financial aid  - take 1/2 lisinopril 87m tabs if you still have = 124muntil you pick up the new lisinopril 1063mrescription  - for blood pressure - take calcium 1200m62maily - over the counter - bone health - take Vitamin D 800 IU daily - over the counter - bone health  -   Food Choices for Gastroesophageal Reflux Disease, Adult When you have gastroesophageal reflux disease (GERD), the foods you eat and your eating habits are very important. Choosing the right foods can help ease your discomfort. What guidelines do I need to follow?  Choose fruits, vegetables, whole grains, and low-fat dairy products.  Choose low-fat meat, fish, and poultry.  Limit fats such as oils, salad dressings, butter, nuts, and avocado.  Keep a food diary. This helps you identify foods that cause symptoms.  Avoid foods that cause symptoms. These may be different for everyone.  Eat small meals often instead of 3 large meals a day.  Eat your meals slowly, in a place where you are relaxed.  Limit fried foods.  Cook foods using methods other than frying.  Avoid drinking alcohol.  Avoid drinking large amounts of liquids with your meals.  Avoid bending over or lying down until 2-3 hours after eating. What foods are not recommended? These are some foods and drinks that may make your symptoms worse: Vegetables  Tomatoes. Tomato juice. Tomato and spaghetti sauce. Chili peppers. Onion and garlic. Horseradish. Fruits  Oranges, grapefruit, and lemon (fruit and juice). Meats  High-fat meats, fish, and poultry. This includes hot dogs, ribs, ham, sausage, salami, and bacon. Dairy  Whole milk and chocolate milk. Sour cream. Cream. Butter. Ice cream. Cream cheese. Drinks  Coffee and tea. Bubbly (carbonated) drinks or energy drinks. Condiments  Hot sauce. Barbecue sauce. Sweets/Desserts  Chocolate and cocoa. Donuts. Peppermint and spearmint. Fats and Oils  High-fat foods. This includes FrenPakistanes and  potato chips. Other  Vinegar. Strong spices. This includes black pepper, white pepper, red pepper, cayenne, curry powder, cloves, ginger, and chili powder. The items listed above may not be a complete list of foods and drinks to avoid. Contact your dietitian for more information.  This information is not intended to replace advice given to you by your health care provider. Make sure you discuss any questions you have with your health care provider. Document Released: 03/11/2012 Document Revised: 02/16/2016 Document Reviewed: 07/15/2013 Elsevier Interactive Patient Education  2017 [employer] for Postmenopausal Women Menopause is a normal process in which your reproductive ability comes to an end. This process happens gradually over a span of months to years, usually between the ages of 48 a8 55. 41nopause is complete when you have missed 12 consecutive menstrual periods. It is important to talk with your health care provider about some of the most common conditions that affect postmenopausal women, such as heart disease, cancer, and bone loss (osteoporosis). Adopting a healthy lifestyle and getting preventive care can help to promote your health and wellness. Those actions can also lower your chances of developing some of these common conditions. What should I know about menopause? During menopause, you may experience a number of symptoms, such as:  Moderate-to-severe hot flashes.  Night sweats.  Decrease in sex drive.  Mood swings.  Headaches.  Tiredness.  Irritability.  Memory problems.  Insomnia. Choosing to treat or not to treat menopausal changes is an individual decision that you make with your health care provider. What should I  know about hormone replacement therapy and supplements? Hormone therapy products are effective for treating symptoms that are associated with menopause, such as hot flashes and night sweats. Hormone replacement carries  certain risks, especially as you become older. If you are thinking about using estrogen or estrogen with progestin treatments, discuss the benefits and risks with your health care provider. What should I know about heart disease and stroke? Heart disease, heart attack, and stroke become more likely as you age. This may be due, in part, to the hormonal changes that your body experiences during menopause. These can affect how your body processes dietary fats, triglycerides, and cholesterol. Heart attack and stroke are both medical emergencies. There are many things that you can do to help prevent heart disease and stroke:  Have your blood pressure checked at least every 1-2 years. High blood pressure causes heart disease and increases the risk of stroke.  If you are 29-48 years old, ask your health care provider if you should take aspirin to prevent a heart attack or a stroke.  Do not use any tobacco products, including cigarettes, chewing tobacco, or electronic cigarettes. If you need help quitting, ask your health care provider.  It is important to eat a healthy diet and maintain a healthy weight.  Be sure to include plenty of vegetables, fruits, low-fat dairy products, and lean protein.  Avoid eating foods that are high in solid fats, added sugars, or salt (sodium).  Get regular exercise. This is one of the most important things that you can do for your health.  Try to exercise for at least 150 minutes each week. The type of exercise that you do should increase your heart rate and make you sweat. This is known as moderate-intensity exercise.  Try to do strengthening exercises at least twice each week. Do these in addition to the moderate-intensity exercise.  Know your numbers.Ask your health care provider to check your cholesterol and your blood glucose. Continue to have your blood tested as directed by your health care provider. What should I know about cancer screening? There are several  types of cancer. Take the following steps to reduce your risk and to catch any cancer development as early as possible. Breast Cancer  Practice breast self-awareness.  This means understanding how your breasts normally appear and feel.  It also means doing regular breast self-exams. Let your health care provider know about any changes, no matter how small.  If you are 48 or older, have a clinician do a breast exam (clinical breast exam or CBE) every year. Depending on your age, family history, and medical history, it may be recommended that you also have a yearly breast X-ray (mammogram).  If you have a family history of breast cancer, talk with your health care provider about genetic screening.  If you are at high risk for breast cancer, talk with your health care provider about having an MRI and a mammogram every year.  Breast cancer (BRCA) gene test is recommended for women who have family members with BRCA-related cancers. Results of the assessment will determine the need for genetic counseling and BRCA1 and for BRCA2 testing. BRCA-related cancers include these types:  Breast. This occurs in males or females.  Ovarian.  Tubal. This may also be called fallopian tube cancer.  Cancer of the abdominal or pelvic lining (peritoneal cancer).  Prostate.  Pancreatic. Cervical, Uterine, and Ovarian Cancer  Your health care provider may recommend that you be screened regularly for cancer of the pelvic  organs. These include your ovaries, uterus, and vagina. This screening involves a pelvic exam, which includes checking for microscopic changes to the surface of your cervix (Pap test).  For women ages 21-65, health care providers may recommend a pelvic exam and a Pap test every three years. For women ages 65-65, they may recommend the Pap test and pelvic exam, combined with testing for human papilloma virus (HPV), every five years. Some types of HPV increase your risk of cervical cancer. Testing  for HPV may also be done on women of any age who have unclear Pap test results.  Other health care providers may not recommend any screening for nonpregnant women who are considered low risk for pelvic cancer and have no symptoms. Ask your health care provider if a screening pelvic exam is right for you.  If you have had past treatment for cervical cancer or a condition that could lead to cancer, you need Pap tests and screening for cancer for at least 20 years after your treatment. If Pap tests have been discontinued for you, your risk factors (such as having a new sexual partner) need to be reassessed to determine if you should start having screenings again. Some women have medical problems that increase the chance of getting cervical cancer. In these cases, your health care provider may recommend that you have screening and Pap tests more often.  If you have a family history of uterine cancer or ovarian cancer, talk with your health care provider about genetic screening.  If you have vaginal bleeding after reaching menopause, tell your health care provider.  There are currently no reliable tests available to screen for ovarian cancer. Lung Cancer  Lung cancer screening is recommended for adults 34-34 years old who are at high risk for lung cancer because of a history of smoking. A yearly low-dose CT scan of the lungs is recommended if you:  Currently smoke.  Have a history of at least 30 pack-years of smoking and you currently smoke or have quit within the past 15 years. A pack-year is smoking an average of one pack of cigarettes per day for one year. Yearly screening should:  Continue until it has been 15 years since you quit.  Stop if you develop a health problem that would prevent you from having lung cancer treatment. Colorectal Cancer  This type of cancer can be detected and can often be prevented.  Routine colorectal cancer screening usually begins at age 32 and continues through  age 72.  If you have risk factors for colon cancer, your health care provider may recommend that you be screened at an earlier age.  If you have a family history of colorectal cancer, talk with your health care provider about genetic screening.  Your health care provider may also recommend using home test kits to check for hidden blood in your stool.  A small camera at the end of a tube can be used to examine your colon directly (sigmoidoscopy or colonoscopy). This is done to check for the earliest forms of colorectal cancer.  Direct examination of the colon should be repeated every 5-10 years until age 14. However, if early forms of precancerous polyps or small growths are found or if you have a family history or genetic risk for colorectal cancer, you may need to be screened more often. Skin Cancer  Check your skin from head to toe regularly.  Monitor any moles. Be sure to tell your health care provider:  About any new moles or  changes in moles, especially if there is a change in a mole's shape or color.  If you have a mole that is larger than the size of a pencil eraser.  If any of your family members has a history of skin cancer, especially at a young age, talk with your health care provider about genetic screening.  Always use sunscreen. Apply sunscreen liberally and repeatedly throughout the day.  Whenever you are outside, protect yourself by wearing long sleeves, pants, a wide-brimmed hat, and sunglasses. What should I know about osteoporosis? Osteoporosis is a condition in which bone destruction happens more quickly than new bone creation. After menopause, you may be at an increased risk for osteoporosis. To help prevent osteoporosis or the bone fractures that can happen because of osteoporosis, the following is recommended:  If you are 66-67 years old, get at least 1,000 mg of calcium and at least 600 mg of vitamin D per day.  If you are older than age 40 but younger than age  30, get at least 1,200 mg of calcium and at least 600 mg of vitamin D per day.  If you are older than age 90, get at least 1,200 mg of calcium and at least 800 mg of vitamin D per day. Smoking and excessive alcohol intake increase the risk of osteoporosis. Eat foods that are rich in calcium and vitamin D, and do weight-bearing exercises several times each week as directed by your health care provider. What should I know about how menopause affects my mental health? Depression may occur at any age, but it is more common as you become older. Common symptoms of depression include:  Low or sad mood.  Changes in sleep patterns.  Changes in appetite or eating patterns.  Feeling an overall lack of motivation or enjoyment of activities that you previously enjoyed.  Frequent crying spells. Talk with your health care provider if you think that you are experiencing depression. What should I know about immunizations? It is important that you get and maintain your immunizations. These include:  Tetanus, diphtheria, and pertussis (Tdap) booster vaccine.  Influenza every year before the flu season begins.  Pneumonia vaccine.  Shingles vaccine. Your health care provider may also recommend other immunizations. This information is not intended to replace advice given to you by your health care provider. Make sure you discuss any questions you have with your health care provider. Document Released: 11/02/2005 Document Revised: 03/30/2016 Document Reviewed: 06/14/2015 Elsevier Interactive Patient Education  2017 [employer].  --  Insomnia Insomnia is a sleep disorder that makes it difficult to fall asleep or to stay asleep. Insomnia can cause tiredness (fatigue), low energy, difficulty concentrating, mood swings, and poor performance at work or school. There are three different ways to classify insomnia:  Difficulty falling asleep.  Difficulty staying asleep.  Waking up too early in the  morning. Any type of insomnia can be long-term (chronic) or short-term (acute). Both are common. Short-term insomnia usually lasts for three months or less. Chronic insomnia occurs at least three times a week for longer than three months. What are the causes? Insomnia may be caused by another condition, situation, or substance, such as:  Anxiety.  Certain medicines.  Gastroesophageal reflux disease (GERD) or other gastrointestinal conditions.  Asthma or other breathing conditions.  Restless legs syndrome, sleep apnea, or other sleep disorders.  Chronic pain.  Menopause. This may include hot flashes.  Stroke.  Abuse of alcohol, tobacco, or illegal drugs.  Depression.  Caffeine.  Neurological disorders, such as Alzheimer disease.  An overactive thyroid (hyperthyroidism). The cause of insomnia may not be known. What increases the risk? Risk factors for insomnia include:  Gender. Women are more commonly affected than men.  Age. Insomnia is more common as you get older.  Stress. This may involve your professional or personal life.  Income. Insomnia is more common in people with lower income.  Lack of exercise.  Irregular work schedule or night shifts.  Traveling between different time zones. What are the signs or symptoms? If you have insomnia, trouble falling asleep or trouble staying asleep is the main symptom. This may lead to other symptoms, such as:  Feeling fatigued.  Feeling nervous about going to sleep.  Not feeling rested in the morning.  Having trouble concentrating.  Feeling irritable, anxious, or depressed. How is this treated? Treatment for insomnia depends on the cause. If your insomnia is caused by an underlying condition, treatment will focus on addressing the condition. Treatment may also include:  Medicines to help you sleep.  Counseling or therapy.  Lifestyle adjustments. Follow these instructions at home:  Take medicines only as  directed by your health care provider.  Keep regular sleeping and waking hours. Avoid naps.  Keep a sleep diary to help you and your health care provider figure out what could be causing your insomnia. Include:  When you sleep.  When you wake up during the night.  How well you sleep.  How rested you feel the next day.  Any side effects of medicines you are taking.  What you eat and drink.  Make your bedroom a comfortable place where it is easy to fall asleep:  Put up shades or special blackout curtains to block light from outside.  Use a white noise machine to block noise.  Keep the temperature cool.  Exercise regularly as directed by your health care provider. Avoid exercising right before bedtime.  Use relaxation techniques to manage stress. Ask your health care provider to suggest some techniques that may work well for you. These may include:  Breathing exercises.  Routines to release muscle tension.  Visualizing peaceful scenes.  Cut back on alcohol, caffeinated beverages, and cigarettes, especially close to bedtime. These can disrupt your sleep.  Do not overeat or eat spicy foods right before bedtime. This can lead to digestive discomfort that can make it hard for you to sleep.  Limit screen use before bedtime. This includes:  Watching TV.  Using your smartphone, tablet, and computer.  Stick to a routine. This can help you fall asleep faster. Try to do a quiet activity, brush your teeth, and go to bed at the same time each night.  Get out of bed if you are still awake after 15 minutes of trying to sleep. Keep the lights down, but try reading or doing a quiet activity. When you feel sleepy, go back to bed.  Make sure that you drive carefully. Avoid driving if you feel very sleepy.  Keep all follow-up appointments as directed by your health care provider. This is important. Contact a health care provider if:  You are tired throughout the day or have trouble  in your daily routine due to sleepiness.  You continue to have sleep problems or your sleep problems get worse. Get help right away if:  You have serious thoughts about hurting yourself or someone else. This information is not intended to replace advice given to you by your health care provider. Make sure you discuss any  questions you have with your health care provider. Document Released: 09/07/2000 Document Revised: 02/10/2016 Document Reviewed: 06/11/2014 Elsevier Interactive Patient Education  2017 [employer].

## 2016-11-28 NOTE — Progress Notes (Addendum)
Kelsey Smith, is a 56 y.o. female  NWG:[id number]  [id number]:[id number]  DOB - Feb 21, 1961  Chief Complaint  Patient presents with  . Gynecologic Exam        Subjective:   Kelsey Smith is a 56 y.o. female here today for a follow up visit for pap smear.  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    Doing well, no c/o.  Recent trx for STD, pt states her partner was treated as well. No current menses for years.  Denies abnml breast discharge.  Does  have vag discharge, but denies burning/pain/dysuria.  Does have insomnia, has been taking trazodone in past to help w/ sleep. gerd sx well controlled, not currently taking ppi.  Patient has No headache, No chest pain, No abdominal pain - No Nausea, No new weakness tingling or numbness, No Cough - SOB.  No problems updated.  ALLERGIES: No Known Allergies  PAST MEDICAL HISTORY: Past Medical History:  Diagnosis Date  . Anxiety   . Depression     MEDICATIONS AT HOME: Prior to Admission medications   Medication Sig Start Date End Date Taking? Authorizing Provider  amitriptyline (ELAVIL) 25 MG tablet Take 1 tablet (25 mg total) by mouth at bedtime as needed for sleep. 11/28/16   Pete Glatter, MD  busPIRone (BUSPAR) 10 MG tablet Take 1 tablet (10 mg total) by mouth every morning. 11/28/16   Pete Glatter, MD  famotidine (PEPCID) 20 MG tablet Take 1 tablet (20 mg total) by mouth 2 (two) times daily. 11/28/16 11/28/17  Pete Glatter, MD  lisinopril (PRINIVIL,ZESTRIL) 10 MG tablet Take 1 tablet (10 mg total) by mouth daily. 11/28/16   Pete Glatter, MD  Multiple Vitamin (MULTIVITAMIN WITH MINERALS) TABS tablet Take 1 tablet by mouth daily. 10/19/16   Ranelle Oyster, MD  nystatin (MYCOSTATIN/NYSTOP) powder Apply topically 4 (four) times daily. 10/29/16   Pete Glatter, MD  PARoxetine (PAXIL) 20 MG tablet Take 1 tablet (20 mg  total) by mouth every morning. 11/28/16   Pete Glatter, MD  potassium chloride SA (K-DUR,KLOR-CON) 20 MEQ tablet Take 1 tablet (20 mEq total) by mouth daily. 11/21/16   Pete Glatter, MD  senna-docusate (SENOKOT-S) 8.6-50 MG tablet Take 2 tablets by mouth 2 (two) times daily. 10/17/16   Jacquelynn Cree, PA-C     Objective:   Vitals:   11/28/16 0832  BP: 98/67  Pulse: 77  Resp: 16  Temp: 98.1 F (36.7 C)  TempSrc: Oral  SpO2: 99%  Weight: 126 lb 9.6 oz (57.4 kg)   Neg orthostatics.  cma assisted w/ well woman exam.  Exam General appearance : Awake, alert, not in any distress. Speech Clear. Not toxic looking, pleasant. Thin appearing. HEENT: Atraumatic and Normocephalic, pupils equally reactive to light. Neck: supple, no JVD.  Breast /axilla: bilat nml appearance, not dippling noted. No palpable masses/nodules/nipple discharge noted on exam Abdomen: Bowel sounds active, Non tender and not distended with no gaurding, rigidity or rebound. Pelvic Exam: Cervix normal in appearance, easy friable epithelium on testing.  external genitalia normal, no adnexal masses or tenderness, no cervical motion tenderness, rectovaginal septum normal, uterus normal size, shape, and consistency and vagina normal with thick yellow/green discharge   Extremities: B/L Lower Ext shows no edema, both legs are warm to touch Neurology: Awake alert, and oriented X 3, CN  II-XII grossly intact, Non focal Skin:No Rash  Data Review Lab Results  Component Value Date   HGBA1C 5.2 09/24/2016    [facility] 11/28/2016 11/06/2016 10/29/2016  Decreased Interest 1 0 0  Down, Depressed, Hopeless 1 0 0  PHQ - 2 Score 2 0 0  Altered sleeping 2 - -  Tired, decreased energy 1 - -  Change in appetite 0 - -  Feeling bad or failure about yourself  1 - -  Trouble concentrating 2 - -  Moving slowly or fidgety/restless 0 - -  Suicidal thoughts 0 - -  PHQ-9 Score 8 - -      Assessment & Plan   1.  Cervical cancer screening bv vs std again? Will f/u w/ labs - PAP, Thin Prep w/HPV rflx HPV Type 16/18 ([facility]) - Cytology - PAP  2. Breast cancer screening - breast screening scholarship - [employer]; Future  3. Low bp, not orthostatics - will reduce lisinopril from 20 to 10mg  qd, instructed to take 1/2 tab of her 20mg  tabs until picks up new rx.  4. Insomnia - discussed long term concerns w/ trazodone, pt amendable to switching to elavil prn. Instructed to take 1/2 tab of 25mg  if feels too groggy in am on 25mg  dose. - sleep hygiene tips provided and discussed  5. gerd - currently well controlled. Not taking ppi. - will dc ppi due to risk of ckd long term; - prn pepcid as needed.  6. Hx of tob/etoh abuse/cocaine - 60 day clean, congratulated her on this. - continue AA mtgs.  7. Depression - denies si/hi/avh - doing well on buspar 10mg  qd and paxil 20mg  qd, renewed - pt not going to [facility] due to cost.  Patient have been counseled extensively about nutrition and exercise  Return in about 3 months (around 02/28/2017), or if symptoms worsen or fail to improve.  The patient was given clear instructions to go to ER or return to medical center if symptoms don't improve, worsen or new problems develop. The patient verbalized understanding. The patient was told to call to get lab results if they haven't heard anything in the next week.   This note has been created with Education officer, environmentalDragon speech recognition software and smart phrase technology. Any transcriptional errors are unintentional.   Pete Glatterawn T Langeland, MD, MBA/MHA [facility] [place], [place] [phone]   11/28/2016, 8:53 AM

## 2016-11-29 LAB — CERVICOVAGINAL ANCILLARY ONLY
Chlamydia: NEGATIVE
Neisseria Gonorrhea: NEGATIVE
Wet Prep (BD Affirm): POSITIVE — AB

## 2016-11-29 LAB — CYTOLOGY - PAP
Diagnosis: NEGATIVE
HPV: NOT DETECTED

## 2016-12-01 ENCOUNTER — Other Ambulatory Visit: Payer: Self-pay | Admitting: Internal Medicine

## 2016-12-01 MED ORDER — METRONIDAZOLE 500 MG PO TABS: 500.0000 mg | ORAL_TABLET | Freq: Two times a day (BID) | ORAL | 0 refills | Status: DC

## 2016-12-04 LAB — CERVICOVAGINAL ANCILLARY ONLY: HERPES (WINDOWPATH): NEGATIVE

## 2016-12-07 ENCOUNTER — Telehealth: Payer: Self-pay

## 2016-12-07 NOTE — Telephone Encounter (Signed)
CMA call to go over lab results  Patient verify DOB  Patient was aware and understood   

## 2016-12-07 NOTE — Telephone Encounter (Signed)
-----   Message from Pete Glatterawn T Langeland, MD sent at 12/05/2016 12:23 PM EDT ----- Pt also neg herpes and hpv on testing.   Pt has bv.  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 Eat more probiotics (ie activia yogurt 2 x a day to help w/ getting good bacteria back in system), rx flagyl rx 500bid x 7days to take - if has not picked up yet.  Please try to complete course, may cause some stomach upset, yogurt may also calm stomach down.  No current STDs.  Her papsmear was negative. Repeat in 5 years. thanks

## 2016-12-17 ENCOUNTER — Encounter: Payer: Self-pay | Admitting: Nurse Practitioner

## 2016-12-17 ENCOUNTER — Ambulatory Visit (INDEPENDENT_AMBULATORY_CARE_PROVIDER_SITE_OTHER): Payer: Self-pay | Admitting: Nurse Practitioner

## 2016-12-17 VITALS — BP 85/56 | HR 75 | Ht 61.0 in | Wt 130.6 lb

## 2016-12-17 DIAGNOSIS — I619 Nontraumatic intracerebral hemorrhage, unspecified: Secondary | ICD-10-CM

## 2016-12-17 DIAGNOSIS — I10 Essential (primary) hypertension: Secondary | ICD-10-CM

## 2016-12-17 DIAGNOSIS — I615 Nontraumatic intracerebral hemorrhage, intraventricular: Secondary | ICD-10-CM

## 2016-12-17 DIAGNOSIS — E7800 Pure hypercholesterolemia, unspecified: Secondary | ICD-10-CM

## 2016-12-17 MED ORDER — ATORVASTATIN CALCIUM 10 MG PO TABS: 10.0000 mg | ORAL_TABLET | Freq: Every day | ORAL | 6 refills | Status: DC

## 2016-12-17 NOTE — Progress Notes (Signed)
I have read the note, and I agree with the clinical assessment and plan.  Treylen Gibbs A. Gladys Gutman, MD, PhD Certified in Neurology, Clinical Neurophysiology, Sleep Medicine, Pain Medicine and Neuroimaging  [facility] [address], [address] [place], [place] [address] [phone]  
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
## 2016-12-17 NOTE — Patient Instructions (Signed)
Stressed the importance of management of risk factors to prevent further stroke No antithrombotics at this time due to risk of bleeding Maintain strict control of hypertension with blood pressure goal below 130/90, today's reading85/56 increase fluid intake  Cholesterol with LDL cholesterol less than 70, followed by primary care,  most recent 109 resume Lipitor Exercise by walking, slowly increase , eat healthy diet with whole grains,  fresh fruits and vegetables Discussed risk for recurrent stroke/ TIA and answered additional questions Follow up in 4 months

## 2016-12-28 ENCOUNTER — Other Ambulatory Visit: Payer: Self-pay | Admitting: Pharmacist

## 2016-12-28 DIAGNOSIS — Z1211 Encounter for screening for malignant neoplasm of colon: Secondary | ICD-10-CM

## 2017-01-07 ENCOUNTER — Encounter: Payer: Self-pay | Admitting: Physical Medicine & Rehabilitation

## 2017-01-07 ENCOUNTER — Encounter: Payer: Medicaid Other | Attending: Physical Medicine & Rehabilitation | Admitting: Physical Medicine & Rehabilitation

## 2017-01-07 ENCOUNTER — Ambulatory Visit: Payer: Self-pay | Admitting: Physical Medicine & Rehabilitation

## 2017-01-07 VITALS — BP 130/73 | HR 78 | Resp 14

## 2017-01-07 DIAGNOSIS — Z820 Family history of epilepsy and other diseases of the nervous system: Secondary | ICD-10-CM | POA: Diagnosis not present

## 2017-01-07 DIAGNOSIS — F17200 Nicotine dependence, unspecified, uncomplicated: Secondary | ICD-10-CM | POA: Insufficient documentation

## 2017-01-07 DIAGNOSIS — F329 Major depressive disorder, single episode, unspecified: Secondary | ICD-10-CM | POA: Insufficient documentation

## 2017-01-07 DIAGNOSIS — F419 Anxiety disorder, unspecified: Secondary | ICD-10-CM | POA: Insufficient documentation

## 2017-01-07 DIAGNOSIS — I10 Essential (primary) hypertension: Secondary | ICD-10-CM | POA: Diagnosis not present

## 2017-01-07 DIAGNOSIS — G8191 Hemiplegia, unspecified affecting right dominant side: Secondary | ICD-10-CM | POA: Insufficient documentation

## 2017-01-07 DIAGNOSIS — Z809 Family history of malignant neoplasm, unspecified: Secondary | ICD-10-CM | POA: Diagnosis not present

## 2017-01-07 DIAGNOSIS — I619 Nontraumatic intracerebral hemorrhage, unspecified: Secondary | ICD-10-CM | POA: Diagnosis not present

## 2017-01-07 DIAGNOSIS — Z801 Family history of malignant neoplasm of trachea, bronchus and lung: Secondary | ICD-10-CM | POA: Insufficient documentation

## 2017-01-07 MED ORDER — CYANOCOBALAMIN 1000 MCG PO TABS: 1000.0000 ug | ORAL_TABLET | Freq: Every day | ORAL | 4 refills | Status: AC

## 2017-01-07 NOTE — Progress Notes (Signed)
Subjective:    Patient ID: Kelsey Smith, female    DOB: 1961-06-29, 56 y.o.   MRN: [id number]  HPI  Mr. Huebert is here in follow up of her left thalamic IPH. She has been doing fairly well. She reports that her pain is worse and that she has pain in all 4 limbs. She feels that her hands are achy. She tends to limp on the right side and finds it difficult to go up and down the stairs.   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 She thinks that her left leg might bother her more from overcompensating. She stopped the lipitor briefly as we discussed at last visit and didn't notice any benefit in her body pain.   She discussed an episode with me from a few weeks ago when she was out shopping late in the day. She remembers paying for her item and nothing else after that. She has had a couple other episodes where she has had a decreased level of arousal. All of these episodes have happened when she has been fatigued or lateer in the day.      Pain Inventory Average Pain 7 Pain Right Now 7 My pain is constant and aching  In the last 24 hours, has pain interfered with the following? General activity n/a Relation with others n/a Enjoyment of life n/a What TIME of day is your pain at its worst? night Sleep (in general) Poor  Pain is worse with: inactivity and some activites Pain improves with: nothing Relief from Meds: 0  Mobility walk without assistance  Function not employed: date last employed .  Neuro/Psych No problems in this area  Prior Studies Any changes since last visit?  no  Physicians involved in your care Any changes since last visit?  no   Family History  Problem Relation Age of Onset  . Lung cancer Mother   . Dementia Father   . Cancer Brother    Social History   Social History  . Marital status: Single    Spouse name: N/A  . Number of children: N/A  . Years of education: N/A   Social History Main Topics    . Smoking status: Former Smoker    Quit date: 09/24/2016  . Smokeless tobacco: Never Used  . Alcohol use No     Comment: quit in AA  09-24-2016  . Drug use: No  . Sexual activity: Not Asked   Other Topics Concern  . None   Social History Narrative  . None   History reviewed. No pertinent surgical history. Past Medical History:  Diagnosis Date  . Anxiety   . Depression    BP 130/73 (BP Location: Left Arm, Patient Position: Sitting, Cuff Size: Normal)   Pulse 78   Resp 14   SpO2 97%   Opioid Risk Score:   Fall Risk Score:  `1  Depression screen PHQ 2/9  Depression screen [facility] 2/9 11/28/2016 11/06/2016 10/29/2016  Decreased Interest 1 0 0  Down, Depressed, Hopeless 1 0 0  PHQ - 2 Score 2 0 0  Altered sleeping 2 - -  Tired, decreased energy 1 - -  Change in appetite 0 - -  Feeling bad or failure about yourself  1 - -  Trouble concentrating 2 - -  Moving slowly or fidgety/restless 0 - -  Suicidal thoughts 0 - -  PHQ-9 Score 8 - -    Review of Systems  Constitutional:  Negative.   HENT: Negative.   Eyes: Negative.   Respiratory: Negative.   Cardiovascular: Negative.   Endocrine: Negative.   Genitourinary: Negative.   Musculoskeletal: Positive for arthralgias, gait problem and myalgias.  Skin: Negative.   Allergic/Immunologic: Negative.   Hematological: Negative.   Psychiatric/Behavioral: Negative.   All other systems reviewed and are negative.      Objective:   Physical Exam  Constitutional: She appears well-developed, well-nourished.  HENT: Normocephalic and atraumatic.  Eyes: EOMI. No discharge.  Cardiovascular: RRR. Respiratory: CTA B GI:  BS+  Musculoskeletal: She exhibits no edema or tenderness. Mild medial knee joint effusion. +meniscal signs.  Neurological:  Left central 7 resolved. Mild right sided sensory loss. Still has decreased proprioception RLE Motor: RUE  5/5 proximal to distal. RUE without ataxia--no pronator drift. No attention deficits LUE  5/5 RLE 4/5. Has some difficulties advancing the leg. Rotates the leg externallhy to help clearing.  LLE 5/5. Improved awareness and insight Skin: Skin is warm and dry. Scar right knee  Psychiatric: pleasant. A little anxious        Assessment & Plan:  Medical Problem List and Plan: 1. 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 -finishing HH therapies tomorrow             - volunteer activities might bee good for her  -she remains pre-vocational   -advised her to avoid going out on her own at the end of the day or when she feels tired.   -f/u with [facility] as planned 3. Right > left arm/leg pain--  residual from thalamic hemorrhage             -knee might meniscal injury   -ice, knee sleeve, tylenol.   -needs to work on gait mechanics   -provide knee strengthening exercises today  4. Mood:  -on buspar and paxil currently 5. Polysubstance abuse: continue AA             -pt seems to be doing well with her substance abuse rehab  -has support from family  -this is her most modifiable risk factor for another hemorrhage. Discussed with the patient today.    Follow up in 3 months. 15 minutes of face to face patient care time were spent during this visit. All questions were encouraged and answered. Greater than 50% of time during this encounter was spent counseling patient/family in regard to her stroke recovery, cognitve deficits, substance abuse.

## 2017-01-07 NOTE — Patient Instructions (Signed)
PLEASE FEEL FREE TO CALL OUR OFFICE WITH ANY PROBLEMS OR QUESTIONS ([phone])      

## 2017-01-16 DIAGNOSIS — Z0289 Encounter for other administrative examinations: Secondary | ICD-10-CM

## 2017-02-04 MED FILL — ?AMITRIPTYLINE HCL 25 MG TA: 25 | 30 days supply | Qty: 30 | Fill #0

## 2017-02-04 MED FILL — ?BUSPIRONE HCL 10 MG TABLET: 10 | 30 days supply | Qty: 30 | Fill #0

## 2017-02-07 ENCOUNTER — Encounter: Payer: Self-pay | Admitting: Internal Medicine

## 2017-02-08 ENCOUNTER — Encounter: Payer: Self-pay | Admitting: Internal Medicine

## 2017-02-11 ENCOUNTER — Encounter: Payer: Self-pay | Admitting: Internal Medicine

## 2017-02-28 ENCOUNTER — Encounter: Payer: Self-pay | Admitting: Family Medicine

## 2017-02-28 ENCOUNTER — Ambulatory Visit: Payer: Medicaid Other | Attending: Family Medicine | Admitting: Family Medicine

## 2017-02-28 ENCOUNTER — Encounter (INDEPENDENT_AMBULATORY_CARE_PROVIDER_SITE_OTHER): Payer: Self-pay

## 2017-02-28 VITALS — BP 113/74 | HR 76 | Temp 98.3°F | Resp 20 | Wt 132.2 lb

## 2017-02-28 DIAGNOSIS — E538 Deficiency of other specified B group vitamins: Secondary | ICD-10-CM

## 2017-02-28 DIAGNOSIS — G4700 Insomnia, unspecified: Secondary | ICD-10-CM | POA: Diagnosis not present

## 2017-02-28 DIAGNOSIS — G5603 Carpal tunnel syndrome, bilateral upper limbs: Secondary | ICD-10-CM | POA: Diagnosis not present

## 2017-02-28 DIAGNOSIS — G5600 Carpal tunnel syndrome, unspecified upper limb: Secondary | ICD-10-CM | POA: Insufficient documentation

## 2017-02-28 DIAGNOSIS — G4709 Other insomnia: Secondary | ICD-10-CM

## 2017-02-28 DIAGNOSIS — I618 Other nontraumatic intracerebral hemorrhage: Secondary | ICD-10-CM | POA: Diagnosis not present

## 2017-02-28 DIAGNOSIS — F329 Major depressive disorder, single episode, unspecified: Secondary | ICD-10-CM | POA: Diagnosis not present

## 2017-02-28 DIAGNOSIS — E7800 Pure hypercholesterolemia, unspecified: Secondary | ICD-10-CM | POA: Diagnosis not present

## 2017-02-28 DIAGNOSIS — I10 Essential (primary) hypertension: Secondary | ICD-10-CM | POA: Diagnosis not present

## 2017-02-28 DIAGNOSIS — I619 Nontraumatic intracerebral hemorrhage, unspecified: Secondary | ICD-10-CM

## 2017-02-28 DIAGNOSIS — F32A Depression, unspecified: Secondary | ICD-10-CM | POA: Insufficient documentation

## 2017-02-28 DIAGNOSIS — F419 Anxiety disorder, unspecified: Secondary | ICD-10-CM | POA: Diagnosis not present

## 2017-02-28 DIAGNOSIS — I69351 Hemiplegia and hemiparesis following cerebral infarction affecting right dominant side: Secondary | ICD-10-CM | POA: Insufficient documentation

## 2017-02-28 MED ORDER — MELOXICAM 7.5 MG PO TABS: 7.5000 mg | ORAL_TABLET | Freq: Every day | ORAL | 3 refills | Status: DC

## 2017-02-28 MED ORDER — GABAPENTIN 300 MG PO CAPS: 300.0000 mg | ORAL_CAPSULE | Freq: Two times a day (BID) | ORAL | 3 refills | Status: DC

## 2017-02-28 MED ORDER — PAROXETINE HCL 20 MG PO TABS: 20.0000 mg | ORAL_TABLET | Freq: Every morning | ORAL | 3 refills | Status: DC

## 2017-02-28 MED ORDER — AMITRIPTYLINE HCL 25 MG PO TABS: 25.0000 mg | ORAL_TABLET | Freq: Every evening | ORAL | 3 refills | Status: DC | PRN

## 2017-02-28 MED ORDER — BUSPIRONE HCL 10 MG PO TABS: 10.0000 mg | ORAL_TABLET | Freq: Every morning | ORAL | 3 refills | Status: DC

## 2017-02-28 MED ORDER — LISINOPRIL 10 MG PO TABS: 10.0000 mg | ORAL_TABLET | Freq: Every day | ORAL | 3 refills | Status: DC
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
## 2017-02-28 NOTE — Patient Instructions (Signed)
Carpal Tunnel Syndrome Carpal tunnel syndrome is a condition that causes pain in your hand and arm. The carpal tunnel is a narrow area located on the palm side of your wrist. Repeated wrist motion or certain diseases may cause swelling within the tunnel. This swelling pinches the main nerve in the wrist (median nerve). What are the causes? This condition may be caused by:  Repeated wrist motions.  Wrist injuries.  Arthritis.  A cyst or tumor in the carpal tunnel.  Fluid buildup during pregnancy.  Sometimes the cause of this condition is not known. What increases the risk? This condition is more likely to develop in:  People who have jobs that cause them to repeatedly move their wrists in the same motion, such as butchers and cashiers.  Women.  People with certain conditions, such as: ? Diabetes. ? Obesity. ? An underactive thyroid (hypothyroidism). ? Kidney failure.  What are the signs or symptoms? Symptoms of this condition include:  A tingling feeling in your fingers, especially in your thumb, index, and middle fingers.  Tingling or numbness in your hand.  An aching feeling in your entire arm, especially when your wrist and elbow are bent for long periods of time.  Wrist pain that goes up your arm to your shoulder.  Pain that goes down into your palm or fingers.  A weak feeling in your hands. You may have trouble grabbing and holding items.  Your symptoms may feel worse during the night. How is this diagnosed? This condition is diagnosed with a medical history and physical exam. You may also have tests, including:  An electromyogram (EMG). This test measures electrical signals sent by your nerves into the muscles.  X-rays.  How is this treated? Treatment for this condition includes:  Lifestyle changes. It is important to stop doing or modify the activity that caused your condition.  Physical or occupational therapy.  Medicines for pain and inflammation.  This may include medicine that is injected into your wrist.  A wrist splint.  Surgery.  Follow these instructions at home: If you have a splint:  Wear it as told by your health care provider. Remove it only as told by your health care provider.  Loosen the splint if your fingers become numb and tingle, or if they turn cold and blue.  Keep the splint clean and dry. General instructions  Take over-the-counter and prescription medicines only as told by your health care provider.  Rest your wrist from any activity that may be causing your pain. If your condition is work related, talk to your employer about changes that can be made, such as getting a wrist pad to use while typing.  If directed, apply ice to the painful area: ? Put ice in a plastic bag. ? Place a towel between your skin and the bag. ? Leave the ice on for 20 minutes, 2-3 times per day.  Keep all follow-up visits as told by your health care provider. This is important.  Do any exercises as told by your health care provider, physical therapist, or occupational therapist. Contact a health care provider if:  You have new symptoms.  Your pain is not controlled with medicines.  Your symptoms get worse. This information is not intended to replace advice given to you by your health care provider. Make sure you discuss any questions you have with your health care provider. Document Released: 09/07/2000 Document Revised: 01/19/2016 Document Reviewed: 01/26/2015 Elsevier Interactive Patient Education  2017 Elsevier Inc.  

## 2017-03-01 ENCOUNTER — Encounter: Payer: Self-pay | Admitting: Nurse Practitioner

## 2017-03-01 LAB — BASIC METABOLIC PANEL
BUN/Creatinine Ratio: 23 (ref 9–23)
BUN: 21 mg/dL (ref 6–24)
CALCIUM: 10.2 mg/dL (ref 8.7–10.2)
CHLORIDE: 101 mmol/L (ref 96–106)
CO2: 21 mmol/L (ref 18–29)
Creatinine, Ser: 0.92 mg/dL (ref 0.57–1.00)
GFR calc non Af Amer: 70 mL/min/{1.73_m2} (ref >59)
GFR, EST AFRICAN AMERICAN: 81 mL/min/{1.73_m2} (ref >59)
Glucose: 84 mg/dL (ref 65–99)
POTASSIUM: 4.8 mmol/L (ref 3.5–5.2)
SODIUM: 138 mmol/L (ref 134–144)

## 2017-03-12 ENCOUNTER — Ambulatory Visit: Payer: Medicaid Other | Admitting: Nurse Practitioner

## 2017-03-18 ENCOUNTER — Encounter: Payer: Self-pay | Admitting: Internal Medicine

## 2017-04-03 MED FILL — AMITRIPTYLINE HCL 25 MG TAB: 25 | 30 days supply | Qty: 30 | Fill #1 | Status: TO

## 2017-04-03 MED FILL — busPIRone HCL 10 MG TABS: 10 | 30 days supply | Qty: 30 | Fill #1 | Status: TO

## 2017-04-08 ENCOUNTER — Ambulatory Visit: Payer: Medicaid Other | Admitting: Physical Medicine & Rehabilitation

## 2017-04-11 MED FILL — MELOXICAM 7.5 MG TABLET: 7.5 | 30 days supply | Qty: 30 | Fill #0 | Status: TO

## 2017-04-11 MED FILL — PARoxetine HCL 20 MG TABS: 20 | 30 days supply | Qty: 30 | Fill #0 | Status: TO

## 2017-04-11 MED FILL — GABAPENTIN 300 MG CAPSULE: 300 | 30 days supply | Qty: 60 | Fill #0 | Status: TO

## 2017-04-11 MED FILL — PANTOPRAZOLE SOD DR 40 MG T: 40 | 30 days supply | Qty: 30 | Fill #0 | Status: TO

## 2017-04-11 MED FILL — ATORVASTATIN 10 MG TABLET: 10 | 30 days supply | Qty: 30 | Fill #0 | Status: TO

## 2017-04-16 ENCOUNTER — Encounter: Payer: Self-pay | Admitting: Physical Medicine & Rehabilitation

## 2017-04-16 ENCOUNTER — Encounter: Payer: Medicaid Other | Attending: Physical Medicine & Rehabilitation | Admitting: Physical Medicine & Rehabilitation

## 2017-04-16 VITALS — BP 104/65 | HR 80

## 2017-04-16 DIAGNOSIS — M79605 Pain in left leg: Secondary | ICD-10-CM | POA: Insufficient documentation

## 2017-04-16 DIAGNOSIS — M654 Radial styloid tenosynovitis [de Quervain]: Secondary | ICD-10-CM

## 2017-04-16 DIAGNOSIS — Z87891 Personal history of nicotine dependence: Secondary | ICD-10-CM | POA: Insufficient documentation

## 2017-04-16 DIAGNOSIS — F1910 Other psychoactive substance abuse, uncomplicated: Secondary | ICD-10-CM | POA: Diagnosis not present

## 2017-04-16 DIAGNOSIS — G5603 Carpal tunnel syndrome, bilateral upper limbs: Secondary | ICD-10-CM

## 2017-04-16 DIAGNOSIS — I10 Essential (primary) hypertension: Secondary | ICD-10-CM | POA: Diagnosis not present

## 2017-04-16 DIAGNOSIS — F329 Major depressive disorder, single episode, unspecified: Secondary | ICD-10-CM | POA: Insufficient documentation

## 2017-04-16 DIAGNOSIS — F419 Anxiety disorder, unspecified: Secondary | ICD-10-CM | POA: Diagnosis not present

## 2017-04-16 DIAGNOSIS — G8191 Hemiplegia, unspecified affecting right dominant side: Secondary | ICD-10-CM

## 2017-04-16 DIAGNOSIS — M79604 Pain in right leg: Secondary | ICD-10-CM | POA: Insufficient documentation

## 2017-04-16 DIAGNOSIS — I619 Nontraumatic intracerebral hemorrhage, unspecified: Secondary | ICD-10-CM | POA: Diagnosis not present

## 2017-04-16 MED ORDER — DICLOFENAC SODIUM 75 MG PO TBEC: 75.0000 mg | DELAYED_RELEASE_TABLET | Freq: Two times a day (BID) | ORAL | 3 refills | Status: DC

## 2017-04-16 MED ORDER — GABAPENTIN 300 MG PO CAPS: 300.0000 mg | ORAL_CAPSULE | Freq: Three times a day (TID) | ORAL | 3 refills | Status: DC

## 2017-04-16 NOTE — Progress Notes (Signed)
Subjective:    Patient ID: Kelsey Smith, female    DOB: 06-08-1961, 56 y.o.   MRN: [id number]  HPI   Mrs. Mcneil SoberCarras is here in follow up of her thalamic hemorrhage. She has had increased pain in her right right wrist especially along the radial side. She has also noticed some tightness in her fingers as well where they "lock up". She was told by primary that she had CTS.  She also has pain along her left leg. She noticed some swelling recently in the left ankle/foot which has since resolved.   She has a follow up MRI of her brain scheduled for this Thursday.   She remains involved with AA and is doing some volunteering as well. She has been sober for 7 months and is proud of it.    Pain Inventory Average Pain 9 Pain Right Now 8 My pain is burning and aching  In the last 24 hours, has pain interfered with the following? General activity 0 Relation with others 0 Enjoyment of life 5 What TIME of day is your pain at its worst? all Sleep (in general) Fair  Pain is worse with: bending and some activites Pain improves with: medication Relief from Meds: 1  Mobility walk without assistance do you drive?  yes  Function not employed: date last employed 09/2016  Neuro/Psych numbness tingling confusion depression anxiety  Prior Studies Any changes since last visit?  no  Physicians involved in your care Any changes since last visit?  no   Family History  Problem Relation Age of Onset  . Lung cancer Mother   . Dementia Father   . Cancer Brother    Social History   Social History  . Marital status: Single    Spouse name: N/A  . Number of children: N/A  . Years of education: N/A   Social History Main Topics  . Smoking status: Former Smoker    Quit date: 09/24/2016  . Smokeless tobacco: Never Used  . Alcohol use No     Comment: quit in AA  09-24-2016  . Drug use: No  . Sexual activity: Not Asked   Other Topics Concern  . None   Social History Narrative  .  None   No past surgical history on file. Past Medical History:  Diagnosis Date  . Anxiety   . Depression    BP 104/65   Pulse 80   SpO2 98%   Opioid Risk Score:   Fall Risk Score:  `1  Depression screen PHQ 2/9  Dep[facility] 04/16/2017 02/28/2017 11/28/2016 11/06/2016 10/29/2016  Decreased Interest 1 1 1  0 0  Down, Depressed, Hopeless 1 1 1  0 0  PHQ - 2 Score 2 2 2  0 0  Altered sleeping - 3 2 - -  Tired, decreased energy - 1 1 - -  Change in appetite - 0 0 - -  Feeling bad or failure about yourself  - 1 1 - -  Trouble concentrating - 1 2 - -  Moving slowly or fidgety/restless - 1 0 - -  Suicidal thoughts - 0 0 - -  PHQ-9 Score - 9 8 - -     Review of Systems  Constitutional: Negative.   HENT: Negative.   Eyes: Negative.   Respiratory: Negative.   Cardiovascular: Negative.   Gastrointestinal: Negative.   Endocrine: Negative.   Genitourinary: Negative.   Musculoskeletal:       Wrist pain  Skin: Negative.   Neurological:  Positive for numbness.       Tingling  Hematological: Negative.   Psychiatric/Behavioral: Positive for confusion and dysphoric mood. The patient is nervous/anxious.   All other systems reviewed and are negative.      Objective:   Physical Exam Constitutional: She appears well-developed, well-nourished.  HENT: Normocephalic and atraumatic.  Eyes: EOMI. No discharge.  Cardiovascular: RRR. Respiratory: CTA B GI: BS+ Musculoskeletal: Right wrist tenderness along APL and EPB. Finkelstein positive Mild medial knee joint effusion. +meniscal signs. Tinel's equivocal. No trigger fingers or contractures noted. Small ganglion cyst left dorsal wrist.  Neurological:  Left central 7 resolved. Mildright sided sensory loss along arm and leg. Still has decreased proprioception RLE Motor: RUE 5/5 proximal to distal. RUE without ataxia--no pronator drift. No attention deficits LUE 5/5 RLE nearly 5/5. Improved gait-good balance  LLE  5/5. Improved awareness and insight Skin: Skin is warm and dry. Scar right knee  Psychiatric: pleasant. A little anxious      Assessment & Plan:  Medical Problem List and Plan: 1. 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 -continue with volunteer work/HH activities.              -f/u MRI per [facility] 3. Right  and left leg pain -? meniscal injury right knee??                         -continue ice, knee sleeve, tylenol.                         - focus on appropriate gait                         -she is making progress as a whole with ambulation and looks much more fluid  -needs to be aware of overuse injuries left leg.  4. Mood:  -on buspar and paxil currently 5. Polysubstance abuse: continue AA -she is very positive and determined regarding her AA 6. Right wrist pain: predominantly de quervain's tenosynovitis. May have an element of CTS  -nerve pain related to thalamic injury  -gave patient information re de quervain's  -ice to wrist/splint  -trial of diclofenac 75mg  bid for now  -consider steroid injection  Follow up in 1 months. of face to face patient care time were spent during this visit. All questions were encouraged and answered.  Marland Kitchen

## 2017-04-16 NOTE — Patient Instructions (Signed)
PLEASE FEEL FREE TO CALL OUR OFFICE WITH ANY PROBLEMS OR QUESTIONS ([phone])      

## 2017-04-18 ENCOUNTER — Ambulatory Visit (INDEPENDENT_AMBULATORY_CARE_PROVIDER_SITE_OTHER): Payer: Medicaid Other | Admitting: Nurse Practitioner

## 2017-04-18 ENCOUNTER — Encounter: Payer: Self-pay | Admitting: Nurse Practitioner

## 2017-04-18 VITALS — BP 107/68 | HR 73 | Ht 61.0 in | Wt 144.2 lb

## 2017-04-18 DIAGNOSIS — I619 Nontraumatic intracerebral hemorrhage, unspecified: Secondary | ICD-10-CM

## 2017-04-18 DIAGNOSIS — E7800 Pure hypercholesterolemia, unspecified: Secondary | ICD-10-CM

## 2017-04-18 DIAGNOSIS — I720 Aneurysm of carotid artery: Secondary | ICD-10-CM

## 2017-04-18 DIAGNOSIS — I10 Essential (primary) hypertension: Secondary | ICD-10-CM

## 2017-04-18 NOTE — Patient Instructions (Signed)
Stressed the importance of management of risk factors to prevent further stroke Maintain strict control of hypertension with blood pressure goal below 130/90, today's reading 107/68 Stay well-hydrated  Cholesterol with LDL cholesterol less than 70, followed by primary care,  continue Lipitor   Exercise by walking,   eat healthy diet with whole grains,  fresh fruits and vegetables Continue AA for alcohol and cocaine abuse, patient claims she has not used since 09/24/2016 Follow up in 6 months

## 2017-04-18 NOTE — Progress Notes (Signed)
[facility]  PATIENT: Kelsey Smith DOB: Feb 12, 1961   REASON FOR VISIT:  follow-up for left thalamus hemorrhage, January 2018  HISTORY FROM: Patient alone at visit    HISTORY OF PRESENT ILLNESS: UPDATE 07/26/2018CM Kelsey Smith, 56 year old female returns for follow-up. 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 She has been sober for 7 months now. Blood pressure in the office today 107/68. She remains on Lipitor without complaints of myalgias. She does complain with some left leg pain due to an injury prior to her stroke. She has started wearing wrist splints on the right for carpal tunnel syndrome. She is exercising by walking. She has not had further stroke or TIA symptoms. Left ICA cavernous aneurysm 5 mm on MRA repeat in one year,then referr to  Dr. Corliss Skainseveshwar . Patient has not worked since an injury to her leg in September 2017. She previously had worked as a Interior and spatial designerhairdresser. She returns for reevaluation   HISTORY 12/17/16 CMMs. Kelsey Smith, 56 year old female returns for follow-up. She presented to the hospital with difficulty with her speech and a fall during the night. Other symptoms included headache she was drinking heavily prior to the onset of headache and weakness, she estimates 8 beers a day she also smokes. 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 2-D echo 60-65% vitamin B12 140 LDL 109 hemoglobin A1c 5.2. No anti-thrombotic prior to admission. Left ICA cavernous aneurysm 5 mm on MRA repeat in one year. Blood pressure was 169/118 on arrival. No statin prior to admission Lipitor added. She also has a history of depression. She returns to the stroke clinic today She obtained some outpatient rehabilitation and now is doing home exercise program. She claims she is going to AA and is no longer  using cocaine , alcohol or smoking. She stopped her Lipitor but she was asked to resume that. She continues to take vitamin B12 supplement blood pressure level in the office today 85 /56. She was encouraged to increase fluids for her depression and she is on amitriptyline BuSpar Paxil. 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 She returns for reevaluation   REVIEW OF SYSTEMS: Full 14 system review of systems performed and notable only for those listed, all others are neg:  Constitutional: neg  Cardiovascular: neg Ear/Nose/Throat: neg  Skin: neg Eyes:Light sensitivity  Respiratory: neg Gastroitestinal: neg  Hematology/Lymphatic: neg  Endocrine: neg Musculoskeletal: Joint pain Allergy/Immunology: neg Neurological: neg Psychiatric: neg Sleep : neg   ALLERGIES: No Known Allergies  HOME MEDICATIONS: Outpatient Medications Prior to Visit  Medication Sig Dispense Refill  . amitriptyline (ELAVIL) 25 MG tablet Take 1 tablet (25 mg total) by mouth at bedtime as needed for sleep. 30 tablet 3  . atorvastatin (LIPITOR) 10 MG tablet Take 1 tablet (10 mg total) by mouth daily. 30 tablet 6  . busPIRone (BUSPAR) 10 MG tablet Take 1 tablet (10 mg total) by mouth every morning. 30 tablet 3  . cyanocobalamin 1000 MCG tablet Take 1 tablet (1,000 mcg total) by mouth daily. 30 tablet 4  . diclofenac (VOLTAREN) 75 MG EC tablet Take 1 tablet (75 mg total) by mouth 2 (two) times daily with a meal. 60 tablet 3  . gabapentin (NEURONTIN) 300 MG capsule Take 1 capsule (300 mg total) by  mouth 3 (three) times daily. 60 capsule 3  . lisinopril (PRINIVIL,ZESTRIL) 10 MG tablet Take 1 tablet (10 mg total) by mouth daily. 30 tablet 3  . PARoxetine (PAXIL) 20 MG tablet Take 1 tablet (20 mg total) by mouth every morning. 30 tablet 3  . nystatin (MYCOSTATIN/NYSTOP) powder Apply topically 4 (four) times daily. (Patient not taking: Reported on 04/18/2017) 15 g 0   No  facility-administered medications prior to visit.     PAST MEDICAL HISTORY: Past Medical History:  Diagnosis Date  . Anxiety   . Depression     PAST SURGICAL HISTORY: No past surgical history on file.  FAMILY HISTORY: Family History  Problem Relation Age of Onset  . Lung cancer Mother   . Dementia Father   . Cancer Brother     SOCIAL HISTORY: Social History   Social History  . Marital status: Single    Spouse name: N/A  . Number of children: N/A  . Years of education: N/A   Occupational History  . Not on file.   Social History Main Topics  . Smoking status: Former Smoker    Quit date: 09/24/2016  . Smokeless tobacco: Never Used  . Alcohol use No     Comment: quit in AA  09-24-2016  . Drug use: No  . Sexual activity: Not on file   Other Topics Concern  . Not on file   Social History Narrative   Pt doing well, (not doing drugs, alcohol, going to AA .       PHYSICAL EXAM  Vitals:   04/18/17 0719  BP: 107/68  Pulse: 73  Weight: 144 lb 3.2 oz (65.4 kg)  Height: 5\' 1"  (1.549 m)   Body mass index is 27.25 kg/m.  Generalized: Well developed, in no acute distress  Head: normocephalic and atraumatic,. Oropharynx benign  Neck: Supple, no carotid bruits  Cardiac: Regular rate rhythm, no murmur  Musculoskeletal: No deformity   Neurological examination   Mentation: Alert oriented to time, place, history taking. Attention span and concentration appropriate. Recent and remote memory intact.  Follows all commands speech and language fluent.   Cranial nerve II-XII: .Pupils were equal round reactive to light extraocular movements were full, visual field were full on confrontational test. No facial weakness . hearing was intact to finger rubbing bilaterally. Uvula tongue midline. head turning and shoulder shrug were normal and symmetric.Tongue protrusion into cheek strength was normal. Motor: normal bulk and tone, full strength in the BUE, BLE,   Sensory:  mild  right-sided sensory loss on the right arm and leg , decreased proprioception right lower extremity  Coordination: finger-nose-finger, heel-to-shin bilaterally, no dysmetria Reflexes: 1+ plantar responses were flexor bilaterally. Gait and Station: Rising up from seated position without assistance, normal stance,  moderate stride, good arm swing, smooth turning, able to perform tiptoe, and heel walking without difficulty. Tandem gait is mildly unsteady  DIAGNOSTIC DATA (LABS, IMAGING, TESTING) - I reviewed patient records, labs, notes, testing and imaging myself where available.  Lab Results  Component Value Date   WBC 9.0 10/29/2016   HGB 11.6 (L) 10/29/2016   HCT 34.9 (L) 10/29/2016   MCV 98.3 10/29/2016   PLT 326 10/29/2016      Component Value Date/Time   NA 138 02/28/2017 1153   K 4.8 02/28/2017 1153   CL 101 02/28/2017 1153   CO2 21 02/28/2017 1153   GLUCOSE 84 02/28/2017 1153   GLUCOSE 93 10/29/2016 1028   BUN 21 02/28/2017 1153  CREATININE 0.92 02/28/2017 1153   CREATININE 0.74 10/29/2016 1028   CALCIUM 10.2 02/28/2017 1153   PROT 7.4 10/29/2016 1028   ALBUMIN 4.6 10/29/2016 1028   AST 29 10/29/2016 1028   ALT 34 (H) 10/29/2016 1028   ALKPHOS 76 10/29/2016 1028   BILITOT 0.3 10/29/2016 1028   GFRNONAA 70 02/28/2017 1153   GFRAA 81 02/28/2017 1153   Lab Results  Component Value Date   CHOL 216 (H) 09/24/2016   HDL 79 09/24/2016   LDLCALC 109 (H) 09/24/2016   TRIG 139 09/24/2016   CHOLHDL 2.7 09/24/2016   Lab Results  Component Value Date   HGBA1C 5.2 09/24/2016   Lab Results  Component Value Date   VITAMINB12 140 (L) 09/24/2016   Lab Results  Component Value Date   TSH 0.665 09/24/2016      ASSESSMENT AND PLAN  56 y.o. year old female  here for hospital follow-up after intraparenchymal  hemorrhage of the brain , left thalmic with risk factors of alcohol abuse,  tobacco abuse,  cocaine abuse hyperlipidemia B12 deficiency hypertension depressive  disorder and aneurysm . The patient is a current patient of Dr. Roda ShuttersXu  who is out of the office today . This note is sent to the work in doctor.    Discussed repeating MRI brain with Dr. Lucia GaskinsAhern who did not feel this was necessary since her stroke was due to cocaine abuse alcohol abuse  Stressed the importance of management of risk factors to prevent further stroke Maintain strict control of hypertension with blood pressure goal below 130/90, today's reading 107/68 Stay well-hydrated  Cholesterol with LDL cholesterol less than 70, followed by primary care,  continue Lipitor   Exercise by walking,   eat healthy diet with whole grains,  fresh fruits and vegetables Continue AA for alcohol and cocaine abuse, patient claims she has not used since 09/24/2016 Follow up in 6 months Will repeat MRA of the brain at that time and refer as necessary I spent 25 min  in total face to face time with the patient more than 50% of which was spent counseling and coordination of care, reviewing test results reviewing medications and discussing and reviewing the diagnosis of stroke and management of risk factors and the importance of continuing to abstain from alcohol and cocaine Nilda RiggsNancy Carolyn Alaiyah Bollman, [facility] HospitalGNP, [facility]lyBC, APRN  [facility] [address], [address] [place], [place] [address] [phone]

## 2017-04-18 NOTE — Progress Notes (Signed)
I have read the note, and I agree with the clinical assessment and plan.  Julio Zappia KEITH   

## 2017-04-29 ENCOUNTER — Ambulatory Visit (INDEPENDENT_AMBULATORY_CARE_PROVIDER_SITE_OTHER): Payer: Medicaid Other | Admitting: Gastroenterology

## 2017-04-29 ENCOUNTER — Encounter: Payer: Self-pay | Admitting: Gastroenterology

## 2017-04-29 VITALS — BP 140/88 | HR 68 | Ht 61.0 in | Wt 146.1 lb

## 2017-04-29 DIAGNOSIS — Z1211 Encounter for screening for malignant neoplasm of colon: Secondary | ICD-10-CM | POA: Diagnosis not present

## 2017-04-29 DIAGNOSIS — I639 Cerebral infarction, unspecified: Secondary | ICD-10-CM

## 2017-04-29 NOTE — Progress Notes (Signed)
[facility] Consult Note:  History: Kelsey Smith 04/29/2017  Referring physician: Jaclyn ShaggyAmao, Enobong, MD  Reason for consult/chief complaint: Colon Cancer Screening   Subjective  HPI:  This is a 56 year old woman referred a few months ago for screening colonoscopy.  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 She has been sober since then, and says he just got her seven-month chip from AA. She denies abdominal pain, altered bowel habits or rectal bleeding, and has no family history of colon cancer. She is not yet back to working, having suffered an injury to her right ankle nearly a year ago. However, she appears to have almost completely recovered from her CVA except for a slight stutter.   ROS:  Review of Systems she denies chest pain dyspnea or dysuria  Past Medical History: Past Medical History:  Diagnosis Date  . Alcoholism (HCC)   . Aneurysm, cerebral   . Anxiety   . Arthritis   . Depression   . HLD (hyperlipidemia)   . Hypertension   . Stroke [facility](HCC)   I reviewed her recent neurology clinic note Past Surgical History: Past Surgical History:  Procedure Laterality Date  . ELBOW FRACTURE SURGERY Left   . GYNECOLOGIC CRYOSURGERY       Family History: Family History  Problem Relation Age of Onset  . Lung cancer Mother   . Dementia Father   . Brain cancer Brother   . Lung cancer Brother   . Breast cancer Maternal Grandmother   . Breast cancer Paternal Aunt     Social History: Social History   Social History  . Marital status: Single    Spouse name: N/A  . Number of children: 0  . Years of education: N/A   Occupational History  . hair stylist    Social History Main Topics  . Smoking status: Former Smoker    Quit date: 09/24/2016  . Smokeless tobacco: Never Used  . Alcohol use No     Comment: quit in AA  09-24-2016  . Drug use: No  . Sexual  activity: Not Asked   Other Topics Concern  . None   Social History Narrative   Pt doing well, (not doing drugs, alcohol, going to AA .      Allergies: No Known Allergies  Outpatient Meds: Current Outpatient Prescriptions  Medication Sig Dispense Refill  . amitriptyline (ELAVIL) 25 MG tablet Take 1 tablet (25 mg total) by mouth at bedtime as needed for sleep. 30 tablet 3  . atorvastatin (LIPITOR) 10 MG tablet Take 1 tablet (10 mg total) by mouth daily. 30 tablet 6  . busPIRone (BUSPAR) 10 MG tablet Take 1 tablet (10 mg total) by mouth every morning. 30 tablet 3  . cyanocobalamin 1000 MCG tablet Take 1 tablet (1,000 mcg total) by mouth daily. 30 tablet 4  . diclofenac (VOLTAREN) 75 MG EC tablet Take 1 tablet (75 mg total) by mouth 2 (two) times daily with a meal. 60 tablet 3  . gabapentin (NEURONTIN) 300 MG capsule Take 1 capsule (300 mg total) by mouth 3 (three) times daily. 60 capsule 3  . lisinopril (PRINIVIL,ZESTRIL) 10 MG tablet Take 1 tablet (10 mg total) by mouth daily. 30 tablet 3  . Multiple Vitamin (MULTIVITAMIN) tablet Take 1 tablet by mouth daily.    Marland Kitchen. PARoxetine (PAXIL) 20 MG tablet Take 1 tablet (20 mg total) by mouth every morning.  30 tablet 3   No current facility-administered medications for this visit.       ___________________________________________________________________ Objective   Exam:  BP 140/88 (BP Location: Left Arm, Patient Position: Sitting, Cuff Size: Normal)   Pulse 68   Ht 5\' 1"  (1.549 m) Comment: height measured without shoes  Wt 146 lb 2 oz (66.3 kg)   BMI 27.61 kg/m    General: this is a(n) Well-appearing woman , antalgic gait   Eyes: sclera anicteric, no redness  ENT: oral mucosa moist without lesions, no cervical or supraclavicular lymphadenopathy, good dentition  CV: RRR without murmur, S1/S2, no JVD, mild ankle edema bilaterally  Resp: clear to auscultation bilaterally, normal RR and effort noted  GI: soft, no tenderness,  with active bowel sounds. No guarding or palpable organomegaly noted.  Skin; warm and dry, no rash or jaundice noted Neuro: awake, alert and oriented x 3. Mild decrease right grip and biceps strength compared to left,  and fluent speech, no facial droop or dysarthria  CBC Latest Ref Rng & Units 10/29/2016 10/01/2016 09/27/2016  WBC 3.8 - 10.8 K/uL 9.0 9.0 10.8(H)  Hemoglobin 11.7 - 15.5 g/dL 11.6(L) 13.5 13.1  Hematocrit 35.0 - 45.0 % 34.9(L) 41.5 38.4  Platelets 140 - 400 K/uL 326 454(H) 316   CMP Latest Ref Rng & Units 02/28/2017 10/29/2016 10/15/2016  Glucose 65 - 99 mg/dL 84 93 161(W)  BUN 6 - 24 mg/dL 21 18 16   Creatinine 0.57 - 1.00 mg/dL 9.60 4.54 0.98  Sodium 134 - 144 mmol/L 138 139 137  Potassium 3.5 - 5.2 mmol/L 4.8 4.7 4.1  Chloride 96 - 106 mmol/L 101 105 104  CO2 18 - 29 mmol/L 21 24 27   Calcium 8.7 - 10.2 mg/dL 11.9 9.9 9.3  Total Protein 6.1 - 8.1 g/dL - 7.4 -  Total Bilirubin 0.2 - 1.2 mg/dL - 0.3 -  Alkaline Phos 33 - 130 U/L - 76 -  AST 10 - 35 U/L - 29 -  ALT 6 - 29 U/L - 34(H) -     Assessment: Encounter Diagnosis  Name Primary?  . Special screening for malignant neoplasms, colon Yes  Status post intracranial hemorrhage with residual neurologic deficit, currently with very good functional status. She is well enough to have a screening colonoscopy performed at this point. We discussed the procedure in detail and she is agreeable.  The benefits and risks of the planned procedure were described in detail with the patient or (when appropriate) their health care proxy.  Risks were outlined as including, but not limited to, bleeding, infection, perforation, adverse medication reaction leading to cardiac or pulmonary decompensation, or pancreatitis (if ERCP).  The limitation of incomplete mucosal visualization was also discussed.  No guarantees or warranties were given.  Total 20 minutes time, over half spent reviewing records and discussing procedure.  Thank you for the  courtesy of this consult.  Please call me with any questions or concerns.  Charlie Pitter III  CC: Jaclyn Shaggy, MD

## 2017-04-30 ENCOUNTER — Telehealth: Payer: Self-pay

## 2017-04-30 ENCOUNTER — Other Ambulatory Visit: Payer: Self-pay

## 2017-04-30 DIAGNOSIS — Z1211 Encounter for screening for malignant neoplasm of colon: Secondary | ICD-10-CM

## 2017-04-30 MED ORDER — NA SULFATE-K SULFATE-MG SULF 17.5-3.13-1.6 GM/177ML PO SOLN: 1.0000 | Freq: Once | ORAL | 0 refills | Status: AC

## 2017-04-30 NOTE — Telephone Encounter (Signed)
Pt was seen on 04-29-2017 in the office. The power had gone out and we couldn't scheduled her screening colonoscopy. Pt has been contacted today. Colonoscopy scheduled for 05-28-2017 @ 2pm. Instructions have been mailed to patients home address on file.

## 2017-05-22 ENCOUNTER — Encounter: Payer: Medicaid Other | Attending: Physical Medicine & Rehabilitation | Admitting: Physical Medicine & Rehabilitation

## 2017-05-22 ENCOUNTER — Encounter: Payer: Self-pay | Admitting: Physical Medicine & Rehabilitation

## 2017-05-22 VITALS — BP 108/74 | HR 70

## 2017-05-22 DIAGNOSIS — M654 Radial styloid tenosynovitis [de Quervain]: Secondary | ICD-10-CM | POA: Insufficient documentation

## 2017-05-22 DIAGNOSIS — F419 Anxiety disorder, unspecified: Secondary | ICD-10-CM | POA: Insufficient documentation

## 2017-05-22 DIAGNOSIS — F1910 Other psychoactive substance abuse, uncomplicated: Secondary | ICD-10-CM | POA: Diagnosis not present

## 2017-05-22 DIAGNOSIS — M79604 Pain in right leg: Secondary | ICD-10-CM | POA: Diagnosis not present

## 2017-05-22 DIAGNOSIS — F329 Major depressive disorder, single episode, unspecified: Secondary | ICD-10-CM | POA: Diagnosis not present

## 2017-05-22 DIAGNOSIS — M79605 Pain in left leg: Secondary | ICD-10-CM | POA: Diagnosis not present

## 2017-05-22 DIAGNOSIS — I619 Nontraumatic intracerebral hemorrhage, unspecified: Secondary | ICD-10-CM

## 2017-05-22 DIAGNOSIS — Z87891 Personal history of nicotine dependence: Secondary | ICD-10-CM | POA: Insufficient documentation

## 2017-05-22 DIAGNOSIS — I10 Essential (primary) hypertension: Secondary | ICD-10-CM | POA: Diagnosis not present

## 2017-05-22 NOTE — Patient Instructions (Signed)
PLEASE FEEL FREE TO CALL OUR OFFICE WITH ANY PROBLEMS OR QUESTIONS ([phone])      

## 2017-05-22 NOTE — Progress Notes (Signed)
Subjective:    Patient ID: Kelsey Smith, female    DOB: 1961/07/13, 56 y.o.   MRN: [id number]  HPI   Kelsey Smith is here in follow up of her left ICH. She is doing quite well although she has had one fall when she lost her balance while she was taking out the trash. Otherwise her right > left wrist have been tender. The diclofenac hasn't helped a great deal. The CTS splint has only been worn at night. Her right knee remains tender at times.   Her memory continues to be an issue as well as her concentration. She has lost two cell phones since I last saw her.      Pain Inventory Average Pain 8 Pain Right Now 6 My pain is constant and aching  In the last 24 hours, has pain interfered with the following? General activity 8 Relation with others 8 Enjoyment of life 8 What TIME of day is your pain at its worst? morning Sleep (in general) Fair  Pain is worse with: walking, standing and some activites Pain improves with: medication Relief from Meds: 5  Mobility walk without assistance ability to climb steps?  yes do you drive?  yes  Function not employed: date last employed 2017  Neuro/Psych weakness trouble walking confusion  Prior Studies Any changes since last visit?  no  Physicians involved in your care Any changes since last visit?  no   Family History  Problem Relation Age of Onset  . Lung cancer Mother   . Dementia Father   . Brain cancer Brother   . Lung cancer Brother   . Breast cancer Maternal Grandmother   . Breast cancer Paternal Aunt    Social History   Social History  . Marital status: Single    Spouse name: N/A  . Number of children: 0  . Years of education: N/A   Occupational History  . hair stylist    Social History Main Topics  . Smoking status: Former Smoker    Quit date: 09/24/2016  . Smokeless tobacco: Never Used  . Alcohol use No     Comment: quit in AA  09-24-2016  . Drug use: No  . Sexual activity: Not on file   Other  Topics Concern  . Not on file   Social History Narrative   Pt doing well, (not doing drugs, alcohol, going to AA .     Past Surgical History:  Procedure Laterality Date  . ELBOW FRACTURE SURGERY Left   . GYNECOLOGIC CRYOSURGERY     Past Medical History:  Diagnosis Date  . Alcoholism (HCC)   . Aneurysm, cerebral   . Anxiety   . Arthritis   . Depression   . HLD (hyperlipidemia)   . Hypertension   . Stroke [facility](HCC)    There were no vitals taken for this visit.  Opioid Risk Score:   Fall Risk Score:  `1  Depression screen PHQ 2/9  [facility] 04/16/2017 02/28/2017 11/28/2016 11/06/2016 10/29/2016  Decreased Interest 1 1 1  0 0  Down, Depressed, Hopeless 1 1 1  0 0  PHQ - 2 Score 2 2 2  0 0  Altered sleeping - 3 2 - -  Tired, decreased energy - 1 1 - -  Change in appetite - 0 0 - -  Feeling bad or failure about yourself  - 1 1 - -  Trouble concentrating - 1 2 - -  Moving slowly or fidgety/restless - 1 0 - -  Suicidal thoughts - 0 0 - -  PHQ-9 Score - 9 8 - -     Review of Systems  Constitutional: Positive for unexpected weight change.  HENT: Negative.   Eyes: Negative.   Respiratory: Negative.   Cardiovascular: Negative.   Gastrointestinal: Negative.   Endocrine: Negative.   Genitourinary: Negative.   Musculoskeletal: Positive for joint swelling.  Skin: Negative.   Allergic/Immunologic: Negative.   Neurological: Negative.   Hematological: Negative.   Psychiatric/Behavioral: Negative.   All other systems reviewed and are negative.      Objective:   Physical Exam  Constitutional: She appears well-developed, well-nourished.  HENT: Normocephalic and atraumatic.  Eyes: EOMI. No discharge.  Cardiovascular: RRR. Respiratory: normal effort GI: BS+ Musculoskeletal: Right wrist tenderness along APL and EPB. Lourena Simmonds remains positive positive Mild medial knee joint effusion. +meniscal signs. Tinel's equivocal. No trigger fingers or contractures noted.  Small ganglion cyst left dorsal wrist.  Neurological:  Left central 7 resolved. Mildright sided sensory loss along arm and leg. Still has decreased proprioception RLE Motor: RUE 5/5 proximal to distal. RUE without ataxia--no pronator drift.  Some attentional issues at times.  LUE 5/5 RLE nearly 5/5. Improved gait-good balance noted. No antalgia LLE 5/5. Improved awareness and insight Skin: Skin is warm and dry. Scar right knee Psychiatric: pleasant. A little anxious      Assessment & Plan:  Medical Problem List and Plan: 1. 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 -continue with volunteer work/HH activities. She's doing quite well.   -she continues to have short term memory and concentration deficits 3. Right  and left leg pain -? meniscal injury right knee??  -continue ice, knee sleeve, tylenol. - exercise as possible, aquatics are the best.  -she is making progress as a whole with ambulation and looks much more fluid             -needs to be aware of overuse injuries left leg.  4. Mood:  -on buspar and paxil currently 5. Polysubstance abuse: continue AA -she remains very positive and determined regarding her AA 6. Right wrist pain: predominantly de quervain's tenosynovitis. ? element of CTS             -nerve pain related to thalamic injury             -After informed consent and preparation of the skin with betadine and isopropyl alcohol, I injected 3mg  (0.5cc) of celestone and 2cc of 1% lidocaine around the right EPB/APL via lateral approach. Additionally, aspiration was performed prior to injection. The patient tolerated well, and no complications were encountered. Afterward the area was cleaned and dressed. Post- injection instructions were provided.              -ice to  wrist/splint             -can use diclofenac prn               Follow up in 3 months. 15 minutes of face to face patient care time were spent during this visit. All questions were encouraged and answered.

## 2017-05-28 ENCOUNTER — Encounter: Payer: Self-pay | Admitting: Gastroenterology

## 2017-05-28 ENCOUNTER — Ambulatory Visit (AMBULATORY_SURGERY_CENTER): Payer: Medicaid Other | Admitting: Gastroenterology

## 2017-05-28 VITALS — BP 123/88 | HR 65 | Temp 97.8°F | Resp 19 | Ht 61.0 in | Wt 146.0 lb

## 2017-05-28 DIAGNOSIS — Z1212 Encounter for screening for malignant neoplasm of rectum: Secondary | ICD-10-CM | POA: Diagnosis not present

## 2017-05-28 DIAGNOSIS — Z1211 Encounter for screening for malignant neoplasm of colon: Secondary | ICD-10-CM

## 2017-05-28 MED ORDER — SODIUM CHLORIDE 0.9 % IV SOLN: 500.0000 mL | INTRAVENOUS | Status: AC

## 2017-05-28 NOTE — Op Note (Signed)
[facility] Patient Name: Kelsey Smith Procedure Date: 05/28/2017 1:46 PM MRN: [id number] Endoscopist: Sherilyn CooterHenry L. Myrtie Neitheranis , MD Age: [age] Referring MD:  Date of Birth: Kelsey Smith Gender: Female Account #: [id number] Procedure:                Colonoscopy Indications:              Screening for colorectal malignant neoplasm, This                            is the patient's first colonoscopy Medicines:                Monitored Anesthesia Care Procedure:                Pre-Anesthesia Assessment:                           - Prior to the procedure, a History and Physical                            was performed, and patient medications and                            allergies were reviewed. The patient's tolerance of                            previous anesthesia was also reviewed. The risks                            and benefits of the procedure and the sedation                            options and risks were discussed with the patient.                            All questions were answered, and informed consent                            was obtained. Prior Anticoagulants: The patient has                            taken no previous anticoagulant or antiplatelet                            agents. ASA Grade Assessment: III - A patient with                            severe systemic disease. After reviewing the risks                            and benefits, the patient was deemed in                            satisfactory condition to undergo the procedure.  After obtaining informed consent, the colonoscope                            was passed under direct vision. Throughout the                            procedure, the patient's blood pressure, pulse, and                            oxygen saturations were monitored continuously. The                            Colonoscope was introduced through the anus and                            advanced to the the  cecum, identified by                            appendiceal orifice and ileocecal valve. The                            colonoscopy was performed without difficulty. The                            patient tolerated the procedure well. The quality                            of the bowel preparation was good. The ileocecal                            valve, appendiceal orifice, and rectum were                            photographed. The quality of the bowel preparation                            was evaluated using the BBPS [facility] Bowel                            Preparation Scale) with scores of: Right Colon = 2,                            Transverse Colon = 2 and Left Colon = 2. The total                            BBPS score equals 6. After lavage. The bowel                            preparation used was SUPREP. Scope In: 1:58:23 PM Scope Out: 2:11:54 PM Scope Withdrawal Time: 0 hours 8 minutes 57 seconds  Total Procedure Duration: 0 hours 13 minutes 31 seconds  Findings:                 The perianal and  digital rectal examinations were                            normal.                           The entire examined colon appeared normal on direct                            and retroflexion views. Complications:            No immediate complications. Estimated Blood Loss:     Estimated blood loss: none. Impression:               - The entire examined colon is normal on direct and                            retroflexion views.                           - No specimens collected. Recommendation:           - Patient has a contact number available for                            emergencies. The signs and symptoms of potential                            delayed complications were discussed with the                            patient. Return to normal activities tomorrow.                            Written discharge instructions were provided to the                            patient.                            - Resume previous diet.                           - Continue present medications.                           - Repeat colonoscopy in 10 years for screening                            purposes. [name] L. Myrtie Neither, MD 05/28/2017 2:14:27 PM This report has been signed electronically.

## 2017-05-28 NOTE — Patient Instructions (Signed)
Impression/Recommendations:  Resume previous diet. Continue present medications.  YOU HAD AN ENDOSCOPIC PROCEDURE TODAY AT THE [facility]:   Refer to the procedure report that was given to you for any specific questions about what was found during the examination.  If the procedure report does not answer your questions, please call your gastroenterologist to clarify.  If you requested that your care partner not be given the details of your procedure findings, then the procedure report has been included in a sealed envelope for you to review at your convenience later.  YOU SHOULD EXPECT: Some feelings of bloating in the abdomen. Passage of more gas than usual.  Walking can help get rid of the air that was put into your GI tract during the procedure and reduce the bloating. If you had a lower endoscopy (such as a colonoscopy or flexible sigmoidoscopy) you may notice spotting of blood in your stool or on the toilet paper. If you underwent a bowel prep for your procedure, you may not have a normal bowel movement for a few days.  Please Note:  You might notice some irritation and congestion in your nose or some drainage.  This is from the oxygen used during your procedure.  There is no need for concern and it should clear up in a day or so.  SYMPTOMS TO REPORT IMMEDIATELY:   Following lower endoscopy (colonoscopy or flexible sigmoidoscopy):  Excessive amounts of blood in the stool  Significant tenderness or worsening of abdominal pains  Swelling of the abdomen that is new, acute  Fever of 100F or higher For urgent or emergent issues, a gastroenterologist can be reached at any hour by calling [phone].   DIET:  We do recommend a small meal at first, but then you may proceed to your regular diet.  Drink plenty of fluids but you should avoid alcoholic beverages for 24 hours.  ACTIVITY:  You should plan to take it easy for the rest of today and you should NOT DRIVE or use heavy  machinery until tomorrow (because of the sedation medicines used during the test).    FOLLOW UP: Our staff will call the number listed on your records the next business day following your procedure to check on you and address any questions or concerns that you may have regarding the information given to you following your procedure. If we do not reach you, we will leave a message.  However, if you are feeling well and you are not experiencing any problems, there is no need to return our call.  We will assume that you have returned to your regular daily activities without incident.  If any biopsies were taken you will be contacted by phone or by letter within the next 1-3 weeks.  Please call us at [phone] if you have not heard about the biopsies in 3 weeks.    SIGNATURES/CONFIDENTIALITY: You and/or your care partner have signed paperwork which will be entered into your electronic medical record.  These signatures attest to the fact that that the information above on your After Visit Summary has been reviewed and is understood.  Full responsibility of the confidentiality of this discharge information lies with you and/or your care-partner. 

## 2017-05-28 NOTE — Progress Notes (Signed)
Report given to PACU, vss 

## 2017-05-29 ENCOUNTER — Telehealth: Payer: Self-pay | Admitting: *Deleted

## 2017-05-29 NOTE — Telephone Encounter (Signed)
  Follow up Call-  Call back number 05/28/2017  Post procedure Call Back phone  # [phone]  Permission to leave phone message Yes  Some recent data might be hidden     Patient questions:  Do you have a fever, pain , or abdominal swelling? No. Pain Score  0 *  Have you tolerated food without any problems? Yes.    Have you been able to return to your normal activities? Yes.    Do you have any questions about your discharge instructions: Diet   No. Medications  No. Follow up visit  No.  Do you have questions or concerns about your Care? No.  Actions: * If pain score is 4 or above: No action needed, pain <4.

## 2017-05-31 ENCOUNTER — Encounter: Payer: Self-pay | Admitting: Family Medicine

## 2017-05-31 ENCOUNTER — Ambulatory Visit: Payer: Medicaid Other | Attending: Family Medicine | Admitting: Family Medicine

## 2017-05-31 VITALS — BP 128/82 | HR 70 | Temp 97.5°F | Ht 61.0 in | Wt 144.2 lb

## 2017-05-31 DIAGNOSIS — M654 Radial styloid tenosynovitis [de Quervain]: Secondary | ICD-10-CM | POA: Diagnosis not present

## 2017-05-31 DIAGNOSIS — F419 Anxiety disorder, unspecified: Secondary | ICD-10-CM

## 2017-05-31 DIAGNOSIS — I10 Essential (primary) hypertension: Secondary | ICD-10-CM

## 2017-05-31 DIAGNOSIS — Z23 Encounter for immunization: Secondary | ICD-10-CM

## 2017-05-31 DIAGNOSIS — Z8673 Personal history of transient ischemic attack (TIA), and cerebral infarction without residual deficits: Secondary | ICD-10-CM | POA: Diagnosis not present

## 2017-05-31 DIAGNOSIS — Z1231 Encounter for screening mammogram for malignant neoplasm of breast: Secondary | ICD-10-CM | POA: Diagnosis not present

## 2017-05-31 DIAGNOSIS — N3941 Urge incontinence: Secondary | ICD-10-CM

## 2017-05-31 DIAGNOSIS — F329 Major depressive disorder, single episode, unspecified: Secondary | ICD-10-CM | POA: Diagnosis not present

## 2017-05-31 DIAGNOSIS — F32A Depression, unspecified: Secondary | ICD-10-CM

## 2017-05-31 DIAGNOSIS — R350 Frequency of micturition: Secondary | ICD-10-CM | POA: Insufficient documentation

## 2017-05-31 DIAGNOSIS — Z79899 Other long term (current) drug therapy: Secondary | ICD-10-CM | POA: Insufficient documentation

## 2017-05-31 DIAGNOSIS — I618 Other nontraumatic intracerebral hemorrhage: Secondary | ICD-10-CM | POA: Diagnosis not present

## 2017-05-31 DIAGNOSIS — I619 Nontraumatic intracerebral hemorrhage, unspecified: Secondary | ICD-10-CM | POA: Diagnosis not present

## 2017-05-31 DIAGNOSIS — Z1239 Encounter for other screening for malignant neoplasm of breast: Secondary | ICD-10-CM

## 2017-05-31 MED ORDER — LISINOPRIL 10 MG PO TABS: 10.0000 mg | ORAL_TABLET | Freq: Every day | ORAL | 3 refills | Status: DC

## 2017-05-31 MED ORDER — ATORVASTATIN CALCIUM 10 MG PO TABS: 10.0000 mg | ORAL_TABLET | Freq: Every day | ORAL | 3 refills | Status: DC

## 2017-05-31 MED ORDER — DICLOFENAC SODIUM 75 MG PO TBEC: 75.0000 mg | DELAYED_RELEASE_TABLET | Freq: Two times a day (BID) | ORAL | 3 refills | Status: DC

## 2017-05-31 MED ORDER — GABAPENTIN 300 MG PO CAPS: 300.0000 mg | ORAL_CAPSULE | Freq: Three times a day (TID) | ORAL | 3 refills | Status: DC

## 2017-05-31 MED ORDER — PAROXETINE HCL 20 MG PO TABS: 20.0000 mg | ORAL_TABLET | Freq: Every morning | ORAL | 3 refills | Status: DC

## 2017-05-31 NOTE — Patient Instructions (Signed)

## 2017-05-31 NOTE — Progress Notes (Signed)
Subjective:  Patient ID: Kelsey Smith, female    DOB: 1961-05-25  Age: 56 y.o. MRN: [id number]  CC: Hypertension and Urinary Frequency   HPI Kelsey Smith  is a 56 year old female with a history of depression and anxiety, insomnia, previous polysubstance abuse, left thalamic intracranial hemorrhage in 08/2016 thought to be secondary to a combination of hypertensive emergency (with residual right-sided weakness), polysubstance abuse (currently attending AA meetings and has been 9 months alcohol free)   She continues to have pain in the radial aspect of her right wrist and is status post cortisone injection by Dr. Hermelinda MedicusSchwartz as treatment for de Quervain's tenosynovitis. Pain is associated with some numbness. 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  Her anxiety and depression are controlled and she has been compliant with all medications. She is concerned that she takes too many medications.  She complains of urinary frequency and urge incontinence which she has experienced for the last few months.  Past Medical History:  Diagnosis Date  . Alcoholism (HCC)   . Aneurysm, cerebral   . Anxiety   . Arthritis   . Depression   . HLD (hyperlipidemia)   . Hypertension   . Stroke [facility](HCC)     Past Surgical History:  Procedure Laterality Date  . ELBOW FRACTURE SURGERY Left   . GYNECOLOGIC CRYOSURGERY      No Known Allergies   Outpatient Medications Prior to Visit  Medication Sig Dispense Refill  . busPIRone (BUSPAR) 10 MG tablet Take 1 tablet (10 mg total) by mouth every morning. 30 tablet 3  . cyanocobalamin 1000 MCG tablet Take 1 tablet (1,000 mcg total) by mouth daily. 30 tablet 4  . Multiple Vitamin (MULTIVITAMIN) tablet Take 1 tablet by mouth daily.    Marland Kitchen. amitriptyline (ELAVIL) 25 MG tablet Take 1 tablet (25 mg total) by mouth at bedtime as needed for sleep. 30 tablet 3    . atorvastatin (LIPITOR) 10 MG tablet Take 1 tablet (10 mg total) by mouth daily. 30 tablet 6  . diclofenac (VOLTAREN) 75 MG EC tablet Take 1 tablet (75 mg total) by mouth 2 (two) times daily with a meal. 60 tablet 3  . gabapentin (NEURONTIN) 300 MG capsule Take 1 capsule (300 mg total) by mouth 3 (three) times daily. 60 capsule 3  . lisinopril (PRINIVIL,ZESTRIL) 10 MG tablet Take 1 tablet (10 mg total) by mouth daily. 30 tablet 3  . PARoxetine (PAXIL) 20 MG tablet Take 1 tablet (20 mg total) by mouth every morning. 30 tablet 3   Facility-Administered Medications Prior to Visit  Medication Dose Route Frequency Provider Last Rate Last Dose  . 0.9 %  sodium chloride infusion  500 mL Intravenous Continuous Danis, Starr LakeHenry L III, MD        ROS Review of Systems Constitutional: Negative for activity change, appetite change and fatigue.  HENT: Negative for congestion, sinus pressure and sore throat.   Eyes: Negative for visual disturbance.  Respiratory: Negative for cough, chest tightness, shortness of breath and wheezing.   Cardiovascular: Negative for chest pain and palpitations.  Gastrointestinal: Negative for abdominal distention, abdominal pain and constipation.  Endocrine: Negative for polydipsia.  Genitourinary: Negative for dysuria and frequency.  positive for urge incontinence Musculoskeletal:       See hpi  Skin: Negative for rash.  Neurological: Positive for weakness and numbness. Negative for tremors  and light-headedness.  Hematological: Does not bruise/bleed easily.  Psychiatric/Behavioral: Negative for agitation and behavioral problems.   Objective:  BP 128/82   Pulse 70   Temp (!) 97.5 F (36.4 C) (Oral)   Ht  (1.549 m)   Wt 144 lb 3.2 oz (65.4 kg)   SpO2 99%   BMI 27.25 kg/m   BP/Weight 05/31/2017 05/28/2017 05/22/2017  Systolic BP 128 123 108  Diastolic BP 82 88 74  Wt. (Lbs) 144.2 146 -  BMI 27.25 27.59 -      Physical Exam Constitutional: She is oriented  to person, place, and time. She appears well-developed and well-nourished.  Cardiovascular: Normal rate, normal heart sounds and intact distal pulses.   No murmur heard. Pulmonary/Chest: Effort normal and breath sounds normal. She has no wheezes. She has no rales. She exhibits no tenderness.  Abdominal: Soft. Bowel sounds are normal. She exhibits no distension and no mass. There is no tenderness.  Musculoskeletal: Normal range of motion.   pain at the radial styloid on active stretch  Neurological: She is alert and oriented to person, place, and time.  Motor strength: Right upper extremity and left upper extremity-5/5 Right lower extremity 4/5; left lower extremity 5/5  Skin: Skin is warm and dry.  Psychiatric: She has a normal mood and affect.   Assessment & Plan:   1. Intraparenchymal hemorrhage of brain (HCC) - L thalamic Residual right lower extremity weakness Risk factor modification - gabapentin (NEURONTIN) 300 MG capsule; Take 1 capsule (300 mg total) by mouth 3 (three) times daily.  Dispense: 60 capsule; Refill: 3  2. Anxiety and depression Controlled Advised to hold off on BuSpar and continue Paxil If symptoms are controlled on just proximal I will discontinue BuSpar as she complains of having to take several medications - PARoxetine (PAXIL) 20 MG tablet; Take 1 tablet (20 mg total) by mouth every morning.  Dispense: 30 tablet; Refill: 3  3. Essential hypertension Controlled Low-sodium diet - lisinopril (PRINIVIL,ZESTRIL) 10 MG tablet; Take 1 tablet (10 mg total) by mouth daily.  Dispense: 30 tablet; Refill: 3  4. De Quervain's tenosynovitis, right Uncontrolled Status post corticosteroid injection Use brace - diclofenac (VOLTAREN) 75 MG EC tablet; Take 1 tablet (75 mg total) by mouth 2 (two) times daily with a meal.  Dispense: 60 tablet; Refill: 3  5. Urge incontinence Discussed Kegel exercises If symptoms persist we will consider addition of oxybutynin  6.  Screening for breast cancer - MM Digital Screening; Future   Meds ordered this encounter  Medications  . gabapentin (NEURONTIN) 300 MG capsule    Sig: Take 1 capsule (300 mg total) by mouth 3 (three) times daily.    Dispense:  60 capsule    Refill:  3  . PARoxetine (PAXIL) 20 MG tablet    Sig: Take 1 tablet (20 mg total) by mouth every morning.    Dispense:  30 tablet    Refill:  3  . atorvastatin (LIPITOR) 10 MG tablet    Sig: Take 1 tablet (10 mg total) by mouth daily.    Dispense:  30 tablet    Refill:  3  . lisinopril (PRINIVIL,ZESTRIL) 10 MG tablet    Sig: Take 1 tablet (10 mg total) by mouth daily.    Dispense:  30 tablet    Refill:  3  . diclofenac (VOLTAREN) 75 MG EC tablet    Sig: Take 1 tablet (75 mg total) by mouth 2 (two) times daily with a meal.  Dispense:  60 tablet    Refill:  3    Follow-up: Return in about 3 months (around 08/30/2017) for Follow-up on hypertension and incontinence.   Jaclyn Shaggy MD

## 2017-06-05 ENCOUNTER — Ambulatory Visit
Admission: RE | Admit: 2017-06-05 | Discharge: 2017-06-05 | Disposition: A | Discharge status: Home / Self Care | Payer: Medicaid Other | Source: Ambulatory Visit | Attending: Family Medicine | Admitting: Family Medicine

## 2017-06-05 DIAGNOSIS — Z1239 Encounter for other screening for malignant neoplasm of breast: Secondary | ICD-10-CM

## 2017-06-07 ENCOUNTER — Other Ambulatory Visit: Payer: Self-pay | Admitting: Family Medicine

## 2017-06-07 DIAGNOSIS — R928 Other abnormal and inconclusive findings on diagnostic imaging of breast: Secondary | ICD-10-CM

## 2017-06-13 ENCOUNTER — Ambulatory Visit
Admission: RE | Admit: 2017-06-13 | Discharge: 2017-06-13 | Disposition: A | Discharge status: Home / Self Care | Payer: Medicaid Other | Source: Ambulatory Visit | Attending: Family Medicine | Admitting: Family Medicine

## 2017-06-13 ENCOUNTER — Telehealth: Payer: Self-pay

## 2017-06-13 DIAGNOSIS — R928 Other abnormal and inconclusive findings on diagnostic imaging of breast: Secondary | ICD-10-CM

## 2017-06-13 NOTE — Telephone Encounter (Signed)
Error

## 2017-06-20 ENCOUNTER — Telehealth: Payer: Self-pay

## 2017-06-20 NOTE — Telephone Encounter (Signed)
Pt was called and informed of lab result.

## 2017-08-21 ENCOUNTER — Encounter: Payer: Medicaid Other | Attending: Physical Medicine & Rehabilitation | Admitting: Physical Medicine & Rehabilitation

## 2017-08-21 ENCOUNTER — Encounter: Payer: Self-pay | Admitting: Physical Medicine & Rehabilitation

## 2017-08-21 VITALS — BP 113/74 | HR 75

## 2017-08-21 DIAGNOSIS — F329 Major depressive disorder, single episode, unspecified: Secondary | ICD-10-CM | POA: Diagnosis not present

## 2017-08-21 DIAGNOSIS — I619 Nontraumatic intracerebral hemorrhage, unspecified: Secondary | ICD-10-CM

## 2017-08-21 DIAGNOSIS — F1020 Alcohol dependence, uncomplicated: Secondary | ICD-10-CM | POA: Insufficient documentation

## 2017-08-21 DIAGNOSIS — Z801 Family history of malignant neoplasm of trachea, bronchus and lung: Secondary | ICD-10-CM | POA: Diagnosis not present

## 2017-08-21 DIAGNOSIS — M6530 Trigger finger, unspecified finger: Secondary | ICD-10-CM | POA: Diagnosis not present

## 2017-08-21 DIAGNOSIS — Z820 Family history of epilepsy and other diseases of the nervous system: Secondary | ICD-10-CM | POA: Diagnosis not present

## 2017-08-21 DIAGNOSIS — I10 Essential (primary) hypertension: Secondary | ICD-10-CM | POA: Diagnosis not present

## 2017-08-21 DIAGNOSIS — Z9889 Other specified postprocedural states: Secondary | ICD-10-CM | POA: Insufficient documentation

## 2017-08-21 DIAGNOSIS — M654 Radial styloid tenosynovitis [de Quervain]: Secondary | ICD-10-CM | POA: Diagnosis not present

## 2017-08-21 DIAGNOSIS — E785 Hyperlipidemia, unspecified: Secondary | ICD-10-CM | POA: Diagnosis not present

## 2017-08-21 DIAGNOSIS — Z803 Family history of malignant neoplasm of breast: Secondary | ICD-10-CM | POA: Insufficient documentation

## 2017-08-21 DIAGNOSIS — Z8673 Personal history of transient ischemic attack (TIA), and cerebral infarction without residual deficits: Secondary | ICD-10-CM | POA: Diagnosis not present

## 2017-08-21 DIAGNOSIS — F419 Anxiety disorder, unspecified: Secondary | ICD-10-CM | POA: Insufficient documentation

## 2017-08-21 DIAGNOSIS — R531 Weakness: Secondary | ICD-10-CM | POA: Insufficient documentation

## 2017-08-21 DIAGNOSIS — Z800 Family history of malignant neoplasm of digestive organs: Secondary | ICD-10-CM | POA: Diagnosis not present

## 2017-08-21 NOTE — Patient Instructions (Signed)
PLEASE FEEL FREE TO CALL OUR OFFICE WITH ANY PROBLEMS OR QUESTIONS ([phone])      

## 2017-08-21 NOTE — Progress Notes (Signed)
Subjective:    Patient ID: Kelsey Smith, female    DOB: Oct 20, 1960, 56 y.o.   MRN: [id number]  HPI   Mrs Mcneil SoberCarras is here in follow up of her left ICH. She is doing well. She has remained abstinent from cigarettes, etoh, and drugs. She is leading groups and meetings with AA and other groups in the community. She's very proud of herself and the progress she's made.    We injected her right wrist with some benefit in august although the benefits were fleeting. She has now noticed that her thumb is "popping" and gets "stuck" frequently sending pain down the right wrist. She is not using a splint consistently  Her knee pain has been reasonable and manageable although she is experiencing swelling and some pain when she's up on the leg for longer periods of time.   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  She has some thoughts of going into the field where she is volunteering right now.  Pain Inventory Average Pain 6 Pain Right Now 5 My pain is dull, tingling and aching  In the last 24 hours, has pain interfered with the following? General activity 5 Relation with others 5 Enjoyment of life 5 What TIME of day is your pain at its worst? night Sleep (in general) Fair  Pain is worse with: sitting and some activites Pain improves with: medication Relief from Meds: 6  Mobility walk without assistance ability to climb steps?  yes do you drive?  yes  Function disabled: date disabled 2018  Neuro/Psych bladder control problems confusion depression  Prior Studies Any changes since last visit?  no  Physicians involved in your care Any changes since last visit?  no   Family History  Problem Relation Age of Onset  . Lung cancer Mother   . Dementia Father   . Brain cancer Brother   . Lung cancer Brother   . Breast cancer Maternal Grandmother   . Breast cancer Paternal Aunt    . Colon cancer Neg Hx    Social History   Socioeconomic History  . Marital status: Single    Spouse name: Not on file  . Number of children: 0  . Years of education: Not on file  . Highest education level: Not on file  Social Needs  . Financial resource strain: Not on file  . Food insecurity - worry: Not on file  . Food insecurity - inability: Not on file  . Transportation needs - medical: Not on file  . Transportation needs - non-medical: Not on file  Occupational History  . Occupation: hair stylist  Tobacco Use  . Smoking status: Former Smoker    Last attempt to quit: 09/24/2016    Years since quitting: 0.9  . Smokeless tobacco: Never Used  Substance and Sexual Activity  . Alcohol use: No    Comment: quit in AA  09-24-2016  . Drug use: No  . Sexual activity: Not on file  Other Topics Concern  . Not on file  Social History Narrative   Pt doing well, (not doing drugs, alcohol, going to AA .     Past Surgical History:  Procedure Laterality Date  . ELBOW FRACTURE SURGERY Left   . GYNECOLOGIC CRYOSURGERY     Past Medical History:  Diagnosis Date  . Alcoholism (HCC)   . Aneurysm, cerebral   . Anxiety   .  Arthritis   . Depression   . HLD (hyperlipidemia)   . Hypertension   . Stroke [facility](HCC)    There were no vitals taken for this visit.  Opioid Risk Score:   Fall Risk Score:  `1  Depression screen PHQ 2/9  Depression screen [facility]/9 04/16/2017 02/28/2017 11/28/2016 11/06/2016 10/29/2016  Decreased Interest 1 1 1  0 0  Down, Depressed, Hopeless 1 1 1  0 0  PHQ - 2 Score 2 2 2  0 0  Altered sleeping - 3 2 - -  Tired, decreased energy - 1 1 - -  Change in appetite - 0 0 - -  Feeling bad or failure about yourself  - 1 1 - -  Trouble concentrating - 1 2 - -  Moving slowly or fidgety/restless - 1 0 - -  Suicidal thoughts - 0 0 - -  PHQ-9 Score - 9 8 - -     Review of Systems  Constitutional: Positive for unexpected weight change.  HENT: Negative.   Eyes: Negative.     Respiratory: Negative.   Cardiovascular: Negative.   Gastrointestinal: Negative.   Endocrine: Negative.   Genitourinary: Negative.   Musculoskeletal: Negative.   Skin: Negative.   Allergic/Immunologic: Negative.   Neurological: Negative.   Hematological: Negative.   Psychiatric/Behavioral: Negative.   All other systems reviewed and are negative.      Objective:   Physical Exam  Constitutional: She appears well-developed, well-nourished.  HENT: Normocephalic and atraumatic.  Eyes: EOMI. No discharge.  Cardiovascular: rrr Respiratory: normal effort GI: BS+ Musculoskeletal: Mild right wrist tenderness along APL and EPB.  Lourena SimmondsFinkelstein still positive.  positivemedial joint line tenderness. . Tinel's equivocal. Trigger finger at right thumb. When it caught, it reproduced her pain.  Neurological:  Left central 7 resolved. Mildright sided sensory loss along arm and leg. Still has decreased proprioception RLE Motor: motor function nearly 5/5 in all 4's.  Improved awareness and insight Skin: Skin is warm and dry. Scar right knee Psychiatric: pleasant. A little anxious      Assessment & Plan:  Medical Problem List and Plan: 1. 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 -continue volunteer activities. May be able to go back to work at a sedentary work level.          -neuropsych testing prior to return? 3. Right and left leg pain -?meniscal injury right knee??  -continue ice, knee sleeve, tylenol. - exercise as possible, aquatics are the best.  -consider injection if persistent -needs to be aware of overuse injuries left leg.  4. Mood:  -on buspar and paxil currently 5. Polysubstance abuse: continue AA -she remains very positive and determined regarding her AA 6.  Right wrist pain: predominantly de quervain's tenosynovitis. ? element of CTS -nerve pain related to thalamic injury -After informed consent and preparation of the skin with betadine and isopropyl alcohol, I injected 3mg  (0.5cc) of celestone and 2cc of 1% lidocaine into the proximal pulley of right thumb via anterior approach. Additionally, aspiration was performed prior to injection. The patient tolerated well, and no complications were encountered. Afterward the area was cleaned and dressed. Post- injection instructions were provided.   -continue ice to wrist/splint -can use diclofenac prn   Follow up in 3months. 15 minutes of face to face patient care time were spent during this visit. All questions were encouraged and answered.

## 2017-08-30 ENCOUNTER — Encounter: Payer: Self-pay | Admitting: Family Medicine

## 2017-08-30 ENCOUNTER — Ambulatory Visit: Payer: Medicaid Other | Attending: Family Medicine | Admitting: Family Medicine

## 2017-08-30 DIAGNOSIS — M654 Radial styloid tenosynovitis [de Quervain]: Secondary | ICD-10-CM

## 2017-08-30 DIAGNOSIS — Z8673 Personal history of transient ischemic attack (TIA), and cerebral infarction without residual deficits: Secondary | ICD-10-CM | POA: Diagnosis not present

## 2017-08-30 DIAGNOSIS — I10 Essential (primary) hypertension: Secondary | ICD-10-CM

## 2017-08-30 DIAGNOSIS — F419 Anxiety disorder, unspecified: Secondary | ICD-10-CM | POA: Diagnosis not present

## 2017-08-30 DIAGNOSIS — Z79899 Other long term (current) drug therapy: Secondary | ICD-10-CM | POA: Insufficient documentation

## 2017-08-30 DIAGNOSIS — F32A Depression, unspecified: Secondary | ICD-10-CM

## 2017-08-30 DIAGNOSIS — I618 Other nontraumatic intracerebral hemorrhage: Secondary | ICD-10-CM | POA: Diagnosis not present

## 2017-08-30 DIAGNOSIS — F329 Major depressive disorder, single episode, unspecified: Secondary | ICD-10-CM | POA: Diagnosis not present

## 2017-08-30 DIAGNOSIS — I619 Nontraumatic intracerebral hemorrhage, unspecified: Secondary | ICD-10-CM | POA: Diagnosis not present

## 2017-08-30 DIAGNOSIS — N3941 Urge incontinence: Secondary | ICD-10-CM | POA: Diagnosis not present

## 2017-08-30 DIAGNOSIS — N3281 Overactive bladder: Secondary | ICD-10-CM | POA: Insufficient documentation

## 2017-08-30 MED ORDER — ATORVASTATIN CALCIUM 10 MG PO TABS: 10.0000 mg | ORAL_TABLET | Freq: Every day | ORAL | 3 refills | Status: DC

## 2017-08-30 MED ORDER — OXYBUTYNIN CHLORIDE 5 MG PO TABS: 5.0000 mg | ORAL_TABLET | Freq: Two times a day (BID) | ORAL | 3 refills | Status: DC

## 2017-08-30 MED ORDER — LISINOPRIL 10 MG PO TABS: 10.0000 mg | ORAL_TABLET | Freq: Every day | ORAL | 3 refills | Status: DC

## 2017-08-30 MED ORDER — GABAPENTIN 300 MG PO CAPS: 300.0000 mg | ORAL_CAPSULE | Freq: Three times a day (TID) | ORAL | 3 refills | Status: DC

## 2017-08-30 MED ORDER — BUSPIRONE HCL 10 MG PO TABS: 10.0000 mg | ORAL_TABLET | Freq: Every morning | ORAL | 3 refills | Status: DC

## 2017-08-30 MED ORDER — DICLOFENAC SODIUM 75 MG PO TBEC: 75.0000 mg | DELAYED_RELEASE_TABLET | Freq: Two times a day (BID) | ORAL | 3 refills | Status: DC

## 2017-08-30 MED ORDER — PAROXETINE HCL 20 MG PO TABS: 20.0000 mg | ORAL_TABLET | Freq: Every morning | ORAL | 3 refills | Status: DC

## 2017-08-30 NOTE — Progress Notes (Signed)
Subjective:  Patient ID: Kelsey Smith, female    DOB: [birth year]  Age: 56 y.o. MRN: [id number]  CC: Hypertension   HPI Kelsey Smith  is a 56 year old female with a history of depression and anxiety, hypertension, insomnia, previous polysubstance abuse, left thalamic intracranial hemorrhage in 08/2016 (with residual right-sided weakness), polysubstance abuse (currently attending AA meetings).  She presents today for a follow-up visit and reports doing well. Her anxiety and depression are controlled on Paxil and BuSpar.  On 08/21/17 she received a right thumb injection for de Quervain's tenosynovitis in her right thumb by Dr Hermelinda MedicusSchwartz and reports improvement in her symptoms.  She does have pain in her right thumb with associated catching of her thumb on trying to extend from a flexed position and she uses a brace.  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 She has residual right leg weakness and walks with a limp.  She had complained of urge incontinence at her last visit and we had discussed Kegel exercises however symptoms still persist.  Past Medical History:  Diagnosis Date  . Alcoholism (HCC)   . Aneurysm, cerebral   . Anxiety   . Arthritis   . Depression   . HLD (hyperlipidemia)   . Hypertension   . Stroke [facility](HCC)     Past Surgical History:  Procedure Laterality Date  . ELBOW FRACTURE SURGERY Left   . GYNECOLOGIC CRYOSURGERY      No Known Allergies   Outpatient Medications Prior to Visit  Medication Sig Dispense Refill  . cyanocobalamin 1000 MCG tablet Take 1 tablet (1,000 mcg total) by mouth daily. 30 tablet 4  . Multiple Vitamin (MULTIVITAMIN) tablet Take 1 tablet by mouth daily.    Marland Kitchen. atorvastatin (LIPITOR) 10 MG tablet Take 1 tablet (10 mg total) by mouth  daily. 30 tablet 3  . busPIRone (BUSPAR) 10 MG tablet Take 1 tablet (10 mg total) by mouth every morning. 30 tablet 3  . diclofenac (VOLTAREN) 75 MG EC tablet Take 1 tablet (75 mg total) by mouth 2 (two) times daily with a meal. 60 tablet 3  . gabapentin (NEURONTIN) 300 MG capsule Take 1 capsule (300 mg total) by mouth 3 (three) times daily. 60 capsule 3  . lisinopril (PRINIVIL,ZESTRIL) 10 MG tablet Take 1 tablet (10 mg total) by mouth daily. 30 tablet 3  . PARoxetine (PAXIL) 20 MG tablet Take 1 tablet (20 mg total) by mouth every morning. 30 tablet 3   Facility-Administered Medications Prior to Visit  Medication Dose Route Frequency Provider Last Rate Last Dose  . 0.9 %  sodium chloride infusion  500 mL Intravenous Continuous Danis, Starr LakeHenry L III, MD        ROS Review of Systems  Constitutional: Negative for activity change, appetite change and fatigue.  HENT: Negative for congestion, sinus pressure and sore throat.   Eyes: Negative for visual disturbance.  Respiratory: Negative for cough, chest tightness, shortness of breath and wheezing.   Cardiovascular: Negative for chest pain and palpitations.  Gastrointestinal: Negative for abdominal distention, abdominal pain and constipation.  Endocrine: Negative for polydipsia.  Genitourinary: Positive for urgency. Negative for dysuria and frequency.  Musculoskeletal: Negative for arthralgias and back pain.  Skin: Negative for rash.  Neurological: Positive for weakness. Negative for  tremors, light-headedness and numbness.  Hematological: Does not bruise/bleed easily.  Psychiatric/Behavioral: Negative for agitation and behavioral problems.    Objective:  BP (!) 84/57   Pulse 80   Temp 97.6 F (36.4 C) (Oral)   Ht 5\' 1"  (1.549 m)   Wt 153 lb (69.4 kg)   SpO2 96%   BMI 28.91 kg/m   BP/Weight 08/30/2017 08/21/2017 05/31/2017  Systolic BP 84 113 128  Diastolic BP 57 74 82  Wt. (Lbs) 153 - 144.2  BMI 28.91 - 27.25      Physical Exam    Constitutional: She is oriented to person, place, and time. She appears well-developed and well-nourished.  Cardiovascular: Normal rate, normal heart sounds and intact distal pulses.  No murmur heard. Pulmonary/Chest: Effort normal and breath sounds normal. She has no wheezes. She has no rales. She exhibits no tenderness.  Abdominal: Soft. Bowel sounds are normal. She exhibits no distension and no mass. There is no tenderness.  Musculoskeletal: Normal range of motion.  Right thumb click on extension from flexed position  Neurological: She is alert and oriented to person, place, and time.  Skin: Skin is warm and dry.  Psychiatric: She has a normal mood and affect.     Assessment & Plan:   1. Anxiety and depression Stable - PARoxetine (PAXIL) 20 MG tablet; Take 1 tablet (20 mg total) by mouth every morning.  Dispense: 30 tablet; Refill: 3 - busPIRone (BUSPAR) 10 MG tablet; Take 1 tablet (10 mg total) by mouth every morning.  Dispense: 30 tablet; Refill: 3  2. Essential hypertension Controlled Blood pressure is on the low side; will consider reducing dose of lisinopril at next visit Low sodium, DASH diet Fasting labs at next visit - lisinopril (PRINIVIL,ZESTRIL) 10 MG tablet; Take 1 tablet (10 mg total) by mouth daily.  Dispense: 30 tablet; Refill: 3  3. Intraparenchymal hemorrhage of brain (HCC) - L thalamic With residual right lower extremity weakness Risk factor modification She is currently on disability - gabapentin (NEURONTIN) 300 MG capsule; Take 1 capsule (300 mg total) by mouth 3 (three) times daily.  Dispense: 60 capsule; Refill: 3 - atorvastatin (LIPITOR) 10 MG tablet; Take 1 tablet (10 mg total) by mouth daily.  Dispense: 30 tablet; Refill: 3  4. De Quervain's tenosynovitis, right Status post right thumb injection by rehab Continue with thumb spica splint - diclofenac (VOLTAREN) 75 MG EC tablet; Take 1 tablet (75 mg total) by mouth 2 (two) times daily with a meal.   Dispense: 60 tablet; Refill: 3  5. Urge incontinence Has tried Kegel exercises with no success Placed on oxybutynin-discussed side effects - oxybutynin (DITROPAN) 5 MG tablet; Take 1 tablet (5 mg total) by mouth 2 (two) times daily.  Dispense: 60 tablet; Refill: 3   Meds ordered this encounter  Medications  . oxybutynin (DITROPAN) 5 MG tablet    Sig: Take 1 tablet (5 mg total) by mouth 2 (two) times daily.    Dispense:  60 tablet    Refill:  3  . PARoxetine (PAXIL) 20 MG tablet    Sig: Take 1 tablet (20 mg total) by mouth every morning.    Dispense:  30 tablet    Refill:  3  . lisinopril (PRINIVIL,ZESTRIL) 10 MG tablet    Sig: Take 1 tablet (10 mg total) by mouth daily.    Dispense:  30 tablet    Refill:  3  . gabapentin (NEURONTIN) 300 MG capsule    Sig: Take 1 capsule (300  mg total) by mouth 3 (three) times daily.    Dispense:  60 capsule    Refill:  3  . diclofenac (VOLTAREN) 75 MG EC tablet    Sig: Take 1 tablet (75 mg total) by mouth 2 (two) times daily with a meal.    Dispense:  60 tablet    Refill:  3  . busPIRone (BUSPAR) 10 MG tablet    Sig: Take 1 tablet (10 mg total) by mouth every morning.    Dispense:  30 tablet    Refill:  3  . atorvastatin (LIPITOR) 10 MG tablet    Sig: Take 1 tablet (10 mg total) by mouth daily.    Dispense:  30 tablet    Refill:  3    Follow-up: Return in about 3 months (around 11/28/2017) for follow of of chronic medical conditions.   Jaclyn ShaggyEnobong Amao MD

## 2017-08-30 NOTE — Patient Instructions (Signed)

## 2017-10-18 NOTE — Progress Notes (Signed)
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  She returns today for follow-up.  She claims she was going to AA but stopped and she has had some alcohol on occasion.  She denies cocaine use.  Blood pressure in the office today 133/85.  She remains on Lipitor without complaints of myalgias and labs followed by primary care.  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  She gets little exercise.  She is on Paxil and BuSpar for her depression.  She returns for reevaluation  UPDATE 07/26/2018CM Kelsey Smith, 57 year old female returns for follow-up. 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 She has been sober for 7 months now. Blood pressure in the office today 107/68. She remains on Lipitor without complaints of myalgias. She does complain with some left leg pain due to an injury prior to her stroke. She has started wearing wrist splints on the right for carpal tunnel syndrome. She is exercising by walking. She has not had further stroke or TIA symptoms. Left ICA cavernous aneurysm 5 mm on MRA repeat in one year,then referr to  Dr. Corliss Skains . Patient has not worked since an injury to her leg in September 2017. She previously had worked as a Interior and spatial designer. She returns for reevaluation   HISTORY 12/17/16 Kelsey Smith, 57 year old female returns for follow-up. She presented to the hospital with difficulty with her speech and a fall during the night. Other symptoms included headache she was drinking heavily prior to the onset of headache and weakness, she  estimates 8 beers a day she also smokes. 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 2-D echo 60-65% vitamin B12 140 LDL 109 hemoglobin A1c 5.2. No anti-thrombotic prior to admission. Left ICA cavernous aneurysm 5 mm on MRA repeat in one year. Blood pressure was 169/118 on arrival. No statin prior to admission Lipitor added. She also has a history of depression. She returns to the stroke clinic today She obtained some outpatient rehabilitation and now is doing home exercise program. She claims she is going to AA and is no longer using cocaine , alcohol or smoking. She stopped her Lipitor but she was asked to resume that. She continues to take vitamin B12 supplement blood pressure level in the office today 85 /56. She was encouraged to increase fluids for her depression and she is on amitriptyline BuSpar Paxil. 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 She returns for reevaluation   REVIEW OF SYSTEMS: Full 14 system review of systems performed and notable only for those listed, all others are neg:  Constitutional: neg  Cardiovascular: neg Ear/Nose/Throat: neg  Skin: neg Eyes:Light sensitivity  Respiratory: neg Gastroitestinal: neg  Hematology/Lymphatic: neg  Endocrine: neg Musculoskeletal: Joint pain Allergy/Immunology: neg Neurological: neg Psychiatric: Depression Sleep : neg   ALLERGIES: No Known Allergies  HOME MEDICATIONS: Outpatient Medications Prior to Visit  Medication Sig Dispense Refill  . atorvastatin (LIPITOR) 10 MG tablet Take 1  tablet (10 mg total) by mouth daily. 30 tablet 3  . busPIRone (BUSPAR) 10 MG tablet Take 1 tablet (10 mg total) by mouth every morning. 30 tablet 3  . cyanocobalamin 1000 MCG tablet Take 1 tablet (1,000 mcg total) by mouth daily. 30 tablet 4  . diclofenac (VOLTAREN) 75 MG EC tablet Take 1 tablet (75 mg total) by mouth 2  (two) times daily with a meal. 60 tablet 3  . gabapentin (NEURONTIN) 300 MG capsule Take 1 capsule (300 mg total) by mouth 3 (three) times daily. 60 capsule 3  . lisinopril (PRINIVIL,ZESTRIL) 10 MG tablet Take 1 tablet (10 mg total) by mouth daily. 30 tablet 3  . Multiple Vitamin (MULTIVITAMIN) tablet Take 1 tablet by mouth daily.    Marland Kitchen PARoxetine (PAXIL) 20 MG tablet Take 1 tablet (20 mg total) by mouth every morning. 30 tablet 3  . oxybutynin (DITROPAN) 5 MG tablet Take 1 tablet (5 mg total) by mouth 2 (two) times daily. 60 tablet 3   Facility-Administered Medications Prior to Visit  Medication Dose Route Frequency Provider Last Rate Last Dose  . 0.9 %  sodium chloride infusion  500 mL Intravenous Continuous Sherrilyn Rist, MD        PAST MEDICAL HISTORY: Past Medical History:  Diagnosis Date  . Alcoholism (HCC)   . Aneurysm, cerebral   . Anxiety   . Arthritis   . Depression   . HLD (hyperlipidemia)   . Hypertension   . Stroke [facility])     PAST SURGICAL HISTORY: Past Surgical History:  Procedure Laterality Date  . ELBOW FRACTURE SURGERY Left   . GYNECOLOGIC CRYOSURGERY      FAMILY HISTORY: Family History  Problem Relation Age of Onset  . Lung cancer Mother   . Dementia Father   . Brain cancer Brother   . Lung cancer Brother   . Breast cancer Maternal Grandmother   . Breast cancer Paternal Aunt   . Colon cancer Neg Hx     SOCIAL HISTORY: Social History   Socioeconomic History  . Marital status: Single    Spouse name: Not on file  . Number of children: 0  . Years of education: Not on file  . Highest education level: Not on file  Social Needs  . Financial resource strain: Not on file  . Food insecurity - worry: Not on file  . Food insecurity - inability: Not on file  . Transportation needs - medical: Not on file  . Transportation needs - non-medical: Not on file  Occupational History  . Occupation: hair stylist  Tobacco Use  . Smoking status: Former Smoker     Last attempt to quit: 09/24/2016    Years since quitting: 1.0  . Smokeless tobacco: Never Used  Substance and Sexual Activity  . Alcohol use: No    Comment: quit in AA  09-24-2016  . Drug use: No  . Sexual activity: Not on file  Other Topics Concern  . Not on file  Social History Narrative   Pt doing well, (not doing drugs, alcohol, going to AA .         PHYSICAL EXAM  Vitals:   10/21/17 0723  BP: 133/85  Pulse: 80  Weight: 151 lb (68.5 kg)  Height: 5' (1.524 m)   Body mass index is 29.49 kg/m.  Generalized: Well developed, in no acute distress  Head: normocephalic and atraumatic,. Oropharynx benign  Neck: Supple, no carotid bruits  Cardiac: Regular rate rhythm, no murmur  Musculoskeletal: No deformity   Neurological examination   Mentation: Alert oriented to time, place, history taking. Attention span and concentration appropriate. Recent and remote memory intact.  Follows all commands speech and language fluent.   Cranial nerve II-XII: .Pupils were equal round reactive to light extraocular movements were full, visual field were full on confrontational test. No facial weakness . hearing was intact to finger rubbing bilaterally. Uvula tongue midline. head turning and shoulder shrug were normal and symmetric.Tongue protrusion into cheek strength was normal. Motor: normal bulk and tone, full strength in the BUE, BLE Sensory:  mild right-sided sensory loss on the right arm and leg , Coordination: finger-nose-finger, heel-to-shin bilaterally, no dysmetria Reflexes: 1+ plantar responses were flexor bilaterally. Gait and Station: Rising up from seated position without assistance, normal stance,  moderate stride, good arm swing, smooth turning, able to perform tiptoe, and heel walking without difficulty. Tandem gait is mildly unsteady  DIAGNOSTIC DATA (LABS, IMAGING, TESTING) - I reviewed patient records, labs, notes, testing and imaging myself where available.  Lab Results    Component Value Date   WBC 9.0 10/29/2016   HGB 11.6 (L) 10/29/2016   HCT 34.9 (L) 10/29/2016   MCV 98.3 10/29/2016   PLT 326 10/29/2016      Component Value Date/Time   NA 138 02/28/2017 1153   K 4.8 02/28/2017 1153   CL 101 02/28/2017 1153   CO2 21 02/28/2017 1153   GLUCOSE 84 02/28/2017 1153   GLUCOSE 93 10/29/2016 1028   BUN 21 02/28/2017 1153   CREATININE 0.92 02/28/2017 1153   CREATININE 0.74 10/29/2016 1028   CALCIUM 10.2 02/28/2017 1153   PROT 7.4 10/29/2016 1028   ALBUMIN 4.6 10/29/2016 1028   AST 29 10/29/2016 1028   ALT 34 (H) 10/29/2016 1028   ALKPHOS 76 10/29/2016 1028   BILITOT 0.3 10/29/2016 1028   GFRNONAA 70 02/28/2017 1153   GFRAA 81 02/28/2017 1153   Lab Results  Component Value Date   CHOL 216 (H) 09/24/2016   HDL 79 09/24/2016   LDLCALC 109 (H) 09/24/2016   TRIG 139 09/24/2016   CHOLHDL 2.7 09/24/2016   Lab Results  Component Value Date   HGBA1C 5.2 09/24/2016   Lab Results  Component Value Date   VITAMINB12 140 (L) 09/24/2016   Lab Results  Component Value Date   TSH 0.665 09/24/2016      ASSESSMENT AND PLAN  57 y.o. year old female  here for  follow-up after intraparenchymal  hemorrhage of the brain , left thalmic with risk factors of alcohol abuse,  tobacco abuse,  cocaine abuse hyperlipidemia B12 deficiency hypertension depressive disorder and aneurysm . The patient is a current patient of Dr. Roda ShuttersXu  who is out of the office today . This note is sent to the work in doctor.    Patient admits to having some alcohol however she denies cocaine use   Stressed the importance of management of risk factors to prevent further stroke Maintain strict control of hypertension with blood pressure goal below 130/90, today's reading 133/85/ Stay well-hydrated  Cholesterol with LDL cholesterol less than 70, followed by primary care,  continue Lipitor   Exercise by walking,   eat healthy diet with whole grains,  fresh fruits and vegetables Restart AA  for alcohol and cocaine abuse, patient claims she has had a drink or two Will repeat MRA of the brain at that time and refer as necessary Will discharge from stroke clinic I spent 25 min  in  total face to face time with the patient more than 50% of which was spent counseling and coordination of care, reviewing test results reviewing medications and discussing and reviewing the diagnosis of stroke and management of risk factors and the importance of continuing to abstain from alcohol and cocaine Nilda Riggs, [facility], [facility], APRN  [facility] [address], [address] [place], [place] [address] [phone]

## 2017-10-21 ENCOUNTER — Encounter (INDEPENDENT_AMBULATORY_CARE_PROVIDER_SITE_OTHER): Payer: Self-pay

## 2017-10-21 ENCOUNTER — Ambulatory Visit: Payer: Medicaid Other | Admitting: Nurse Practitioner

## 2017-10-21 ENCOUNTER — Encounter: Payer: Self-pay | Admitting: Nurse Practitioner

## 2017-10-21 VITALS — BP 133/85 | HR 80 | Ht 60.0 in | Wt 151.0 lb

## 2017-10-21 DIAGNOSIS — E785 Hyperlipidemia, unspecified: Secondary | ICD-10-CM | POA: Diagnosis not present

## 2017-10-21 DIAGNOSIS — I671 Cerebral aneurysm, nonruptured: Secondary | ICD-10-CM | POA: Diagnosis not present

## 2017-10-21 DIAGNOSIS — I10 Essential (primary) hypertension: Secondary | ICD-10-CM

## 2017-10-21 DIAGNOSIS — F1010 Alcohol abuse, uncomplicated: Secondary | ICD-10-CM

## 2017-10-21 DIAGNOSIS — I619 Nontraumatic intracerebral hemorrhage, unspecified: Secondary | ICD-10-CM | POA: Diagnosis not present

## 2017-10-21 DIAGNOSIS — I720 Aneurysm of carotid artery: Secondary | ICD-10-CM | POA: Diagnosis not present

## 2017-10-21 NOTE — Patient Instructions (Signed)
Stressed the importance of management of risk factors to prevent further stroke Maintain strict control of hypertension with blood pressure goal below 130/90, today's reading 133/85/ Stay well-hydrated  Cholesterol with LDL cholesterol less than 70, followed by primary care,  continue Lipitor   Exercise by walking,   eat healthy diet with whole grains,  fresh fruits and vegetables Restart AA for alcohol and cocaine abuse, patient claims she has had a drink Will repeat MRA of the brain at that time and refer as necessary Will discharge from stroke clinic

## 2017-10-30 ENCOUNTER — Ambulatory Visit
Admission: RE | Admit: 2017-10-30 | Discharge: 2017-10-30 | Disposition: A | Discharge status: Home / Self Care | Payer: Medicaid Other | Source: Ambulatory Visit | Attending: Nurse Practitioner | Admitting: Nurse Practitioner

## 2017-10-30 DIAGNOSIS — I671 Cerebral aneurysm, nonruptured: Secondary | ICD-10-CM

## 2017-10-31 ENCOUNTER — Telehealth: Payer: Self-pay

## 2017-10-31 NOTE — Telephone Encounter (Signed)
Notes recorded by Hildred AlaminMurrell, Katrina Y, RN on 10/31/2017 at 1:28 PM EST Unable to get in contact with patient. The rang just rang for 3 minutes no vm was set up. Rn will try later. ------

## 2017-10-31 NOTE — Telephone Encounter (Signed)
-----   Message from Nilda RiggsNancy Carolyn Martin, NP sent at 10/30/2017 12:20 PM EST ----- MRA of the head unchanged from previous.  Please call the patient

## 2017-11-06 NOTE — Telephone Encounter (Signed)
LEft 2nd vm for patient to call back about MRA head results. ------

## 2017-11-06 NOTE — Telephone Encounter (Signed)
Rn receive call from patient. MRA head unchanged from the previous one. PT verbalized understanding.

## 2017-11-13 ENCOUNTER — Encounter: Payer: Medicaid Other | Attending: Physical Medicine & Rehabilitation | Admitting: Physical Medicine & Rehabilitation

## 2017-11-20 ENCOUNTER — Ambulatory Visit: Payer: Medicaid Other | Admitting: Physical Medicine & Rehabilitation

## 2017-11-29 ENCOUNTER — Encounter: Payer: Self-pay | Admitting: Family Medicine

## 2017-11-29 ENCOUNTER — Ambulatory Visit: Payer: Medicaid Other | Attending: Family Medicine | Admitting: Family Medicine

## 2017-11-29 VITALS — BP 104/69 | HR 68 | Temp 97.6°F | Ht 60.0 in | Wt 151.2 lb

## 2017-11-29 DIAGNOSIS — I69251 Hemiplegia and hemiparesis following other nontraumatic intracranial hemorrhage affecting right dominant side: Secondary | ICD-10-CM | POA: Insufficient documentation

## 2017-11-29 DIAGNOSIS — F32A Depression, unspecified: Secondary | ICD-10-CM

## 2017-11-29 DIAGNOSIS — R202 Paresthesia of skin: Secondary | ICD-10-CM

## 2017-11-29 DIAGNOSIS — G8191 Hemiplegia, unspecified affecting right dominant side: Secondary | ICD-10-CM

## 2017-11-29 DIAGNOSIS — N3281 Overactive bladder: Secondary | ICD-10-CM

## 2017-11-29 DIAGNOSIS — E785 Hyperlipidemia, unspecified: Secondary | ICD-10-CM | POA: Insufficient documentation

## 2017-11-29 DIAGNOSIS — I10 Essential (primary) hypertension: Secondary | ICD-10-CM

## 2017-11-29 DIAGNOSIS — G4700 Insomnia, unspecified: Secondary | ICD-10-CM | POA: Insufficient documentation

## 2017-11-29 DIAGNOSIS — G4709 Other insomnia: Secondary | ICD-10-CM

## 2017-11-29 DIAGNOSIS — F329 Major depressive disorder, single episode, unspecified: Secondary | ICD-10-CM

## 2017-11-29 DIAGNOSIS — I619 Nontraumatic intracerebral hemorrhage, unspecified: Secondary | ICD-10-CM | POA: Diagnosis not present

## 2017-11-29 DIAGNOSIS — Z79899 Other long term (current) drug therapy: Secondary | ICD-10-CM | POA: Diagnosis not present

## 2017-11-29 DIAGNOSIS — F419 Anxiety disorder, unspecified: Secondary | ICD-10-CM | POA: Diagnosis not present

## 2017-11-29 MED ORDER — LISINOPRIL 10 MG PO TABS
10.0000 mg | ORAL_TABLET | Freq: Every day | ORAL | 3 refills | Status: DC
Start: 2017-11-29 — End: 2018-03-05

## 2017-11-29 MED ORDER — ATORVASTATIN CALCIUM 10 MG PO TABS: 10.0000 mg | ORAL_TABLET | Freq: Every day | ORAL | 3 refills | Status: DC

## 2017-11-29 MED ORDER — OXYBUTYNIN CHLORIDE 5 MG PO TABS: 5.0000 mg | ORAL_TABLET | Freq: Two times a day (BID) | ORAL | 3 refills | Status: DC

## 2017-11-29 MED ORDER — TRAZODONE HCL 50 MG PO TABS: 50.0000 mg | ORAL_TABLET | Freq: Every evening | ORAL | 3 refills | Status: DC | PRN

## 2017-11-29 MED ORDER — GABAPENTIN 300 MG PO CAPS: 300.0000 mg | ORAL_CAPSULE | Freq: Three times a day (TID) | ORAL | 3 refills | Status: DC

## 2017-11-29 MED ORDER — PAROXETINE HCL 20 MG PO TABS: 20.0000 mg | ORAL_TABLET | Freq: Every morning | ORAL | 3 refills | Status: DC

## 2017-11-29 MED ORDER — BUSPIRONE HCL 10 MG PO TABS: 10.0000 mg | ORAL_TABLET | Freq: Every morning | ORAL | 3 refills | Status: DC

## 2017-11-29 NOTE — Progress Notes (Signed)
Subjective:  Patient ID: Kelsey Smith, female    DOB: [birth year]  Age: 57 y.o. MRN: [id number]  CC: Hypertension   HPI Kelsey L Crossno is a 57 year old female with a history of depression and anxiety, hypertension, insomnia, previous polysubstance abuse, left thalamic intracranial hemorrhage in 08/2016 (with residual right-sided weakness), polysubstance abuse (currently attending [place] meetings).  She was seen by [facility] in 10/21/17 for follow-up of her left thalamic intracranial hemorrhage; MRA of the head revealed unchanged 5 mm left ICA superior hypophyseal region aneurysm, unchanged from previous.  She informs me she was released by neurology. She complains of insomnia and would like medication to help her sleep; denies intake of caffeinated products late in the afternoon.  Complains of numbness and tingling on the medial aspect of her right thigh at her previous thigh wound with some swelling. 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 Tessa Lerner she will be unable to work for the rest of the year.  She has residual right leg weakness and walks with a limp.   Her anxiety and depression are controlled on Paxil and BuSpar and she takes oxybutynin for management of urge incontinence.   Past Medical History:  Diagnosis Date  . Alcoholism ([place])   . Aneurysm, cerebral   . Anxiety   . Arthritis   . Depression   . HLD (hyperlipidemia)   . Hypertension   . Stroke [facility])     Past Surgical History:  Procedure Laterality Date  . ELBOW FRACTURE SURGERY Left   . GYNECOLOGIC CRYOSURGERY      No Known Allergies   Outpatient Medications Prior to Visit  Medication Sig Dispense Refill  . cyanocobalamin 1000 MCG tablet Take 1 tablet (1,000 mcg total) by mouth daily. 30 tablet 4  . diclofenac (VOLTAREN) 75 MG EC tablet Take 1 tablet (75 mg total) by mouth 2 (two) times  daily with a meal. 60 tablet 3  . Multiple Vitamin (MULTIVITAMIN) tablet Take 1 tablet by mouth daily.    Marland Kitchen atorvastatin (LIPITOR) 10 MG tablet Take 1 tablet (10 mg total) by mouth daily. 30 tablet 3  . busPIRone (BUSPAR) 10 MG tablet Take 1 tablet (10 mg total) by mouth every morning. 30 tablet 3  . gabapentin (NEURONTIN) 300 MG capsule Take 1 capsule (300 mg total) by mouth 3 (three) times daily. 60 capsule 3  . lisinopril (PRINIVIL,ZESTRIL) 10 MG tablet Take 1 tablet (10 mg total) by mouth daily. 30 tablet 3  . PARoxetine (PAXIL) 20 MG tablet Take 1 tablet (20 mg total) by mouth every morning. 30 tablet 3   Facility-Administered Medications Prior to Visit  Medication Dose Route Frequency Provider Last Rate Last Dose  . 0.9 %  sodium chloride infusion  500 mL Intravenous Continuous Danis, Estill Cotta III, MD        ROS Review of Systems  Constitutional: Negative for activity change, appetite change and fatigue.  HENT: Negative for congestion, sinus pressure and sore throat.   Eyes: Negative for visual disturbance.  Respiratory: Negative for cough, chest tightness, shortness of breath and wheezing.   Cardiovascular: Negative for chest pain and palpitations.  Gastrointestinal: Negative for abdominal distention, abdominal pain and constipation.  Endocrine: Negative for polydipsia.  Genitourinary: Negative for dysuria and frequency.  Musculoskeletal: Negative for arthralgias and back pain.  Skin: Negative for rash.  Neurological: Positive for weakness. Negative for tremors,  light-headedness and numbness.  Hematological: Does not bruise/bleed easily.  Psychiatric/Behavioral: Negative for agitation and behavioral problems.    Objective:  BP 104/69   Pulse 68   Temp 97.6 F (36.4 C) (Oral)   Ht 5' (1.524 m)   Wt 151 lb 3.2 oz (68.6 kg)   SpO2 96%   BMI 29.53 kg/m   BP/Weight 11/29/2017 10/21/2017 61/12/4313  Systolic BP 400 867 84  Diastolic BP 69 85 57  Wt. (Lbs) 151.2 151 153  BMI  29.53 29.49 28.91      Physical Exam  Constitutional: She is oriented to person, place, and time. She appears well-developed and well-nourished.  Cardiovascular: Normal rate, normal heart sounds and intact distal pulses.  No murmur heard. Pulmonary/Chest: Effort normal and breath sounds normal. She has no wheezes. She has no rales. She exhibits no tenderness.  Abdominal: Soft. Bowel sounds are normal. She exhibits no distension and no mass. There is no tenderness.  Musculoskeletal: Normal range of motion.  Neurological: She is alert and oriented to person, place, and time.  Paresthesia on left medial thigh Slightly reduced motor strength in the right upper and right lower extremity  Skin:  Left lower medial thigh scar from previous injury    CMP Latest Ref Rng & Units 02/28/2017 10/29/2016 10/15/2016  Glucose 65 - 99 mg/dL 84 93 133(H)  BUN 6 - 24 mg/dL '21 18 16  ' Creatinine 0.57 - 1.00 mg/dL 0.92 0.74 0.81  Sodium 134 - 144 mmol/L 138 139 137  Potassium 3.5 - 5.2 mmol/L 4.8 4.7 4.1  Chloride 96 - 106 mmol/L 101 105 104  CO2 18 - 29 mmol/L '21 24 27  ' Calcium 8.7 - 10.2 mg/dL 10.2 9.9 9.3  Total Protein 6.1 - 8.1 g/dL - 7.4 -  Total Bilirubin 0.2 - 1.2 mg/dL - 0.3 -  Alkaline Phos 33 - 130 U/L - 76 -  AST 10 - 35 U/L - 29 -  ALT 6 - 29 U/L - 34(H) -    Lipid Panel     Component Value Date/Time   CHOL 216 (H) 09/24/2016 0716   TRIG 139 09/24/2016 0716   HDL 79 09/24/2016 0716   CHOLHDL 2.7 09/24/2016 0716   VLDL 28 09/24/2016 0716   LDLCALC 109 (H) 09/24/2016 0716    Assessment & Plan:   1. Anxiety and depression Controlled with residual right-sided weakness - busPIRone (BUSPAR) 10 MG tablet; Take 1 tablet (10 mg total) by mouth every morning.  Dispense: 30 tablet; Refill: 3 - PARoxetine (PAXIL) 20 MG tablet; Take 1 tablet (20 mg total) by mouth every morning.  Dispense: 30 tablet; Refill: 3  2. Intraparenchymal hemorrhage of brain (HCC) - L thalamic With residual  right-sided weakness Risk factor modification - atorvastatin (LIPITOR) 10 MG tablet; Take 1 tablet (10 mg total) by mouth daily.  Dispense: 30 tablet; Refill: 3  3. Essential hypertension Controlled Counseled on blood pressure goal of less than 130/80, low-sodium, DASH diet, medication compliance, 150 minutes of moderate intensity exercise per week. Discussed medication compliance, adverse effects. - CMP14+EGFR; Future - Lipid panel; Future - lisinopril (PRINIVIL,ZESTRIL) 10 MG tablet; Take 1 tablet (10 mg total) by mouth daily.  Dispense: 30 tablet; Refill: 3  4. Overactive bladder Stable - oxybutynin (DITROPAN) 5 MG tablet; Take 1 tablet (5 mg total) by mouth 2 (two) times daily.  Dispense: 60 tablet; Refill: 3  5. Right hemiparesis ([place]) Advised to change positions slowly  6. Paresthesia - gabapentin (NEURONTIN) 300 MG  capsule; Take 1 capsule (300 mg total) by mouth 3 (three) times daily.  Dispense: 60 capsule; Refill: 3  7. Other insomnia Discussed sleep hygiene, avoiding caffeinated products late in the afternoons. - traZODone (DESYREL) 50 MG tablet; Take 1 tablet (50 mg total) by mouth at bedtime as needed for sleep.  Dispense: 30 tablet; Refill: 3   Meds ordered this encounter  Medications  . busPIRone (BUSPAR) 10 MG tablet    Sig: Take 1 tablet (10 mg total) by mouth every morning.    Dispense:  30 tablet    Refill:  3  . atorvastatin (LIPITOR) 10 MG tablet    Sig: Take 1 tablet (10 mg total) by mouth daily.    Dispense:  30 tablet    Refill:  3  . lisinopril (PRINIVIL,ZESTRIL) 10 MG tablet    Sig: Take 1 tablet (10 mg total) by mouth daily.    Dispense:  30 tablet    Refill:  3  . PARoxetine (PAXIL) 20 MG tablet    Sig: Take 1 tablet (20 mg total) by mouth every morning.    Dispense:  30 tablet    Refill:  3  . gabapentin (NEURONTIN) 300 MG capsule    Sig: Take 1 capsule (300 mg total) by mouth 3 (three) times daily.    Dispense:  60 capsule    Refill:  3  .  oxybutynin (DITROPAN) 5 MG tablet    Sig: Take 1 tablet (5 mg total) by mouth 2 (two) times daily.    Dispense:  60 tablet    Refill:  3  . traZODone (DESYREL) 50 MG tablet    Sig: Take 1 tablet (50 mg total) by mouth at bedtime as needed for sleep.    Dispense:  30 tablet    Refill:  3    Follow-up: Return in about 3 months (around 03/01/2018) for follow up of chronic medical conditions.   Charlott Rakes MD

## 2017-12-03 ENCOUNTER — Ambulatory Visit: Payer: Medicaid Other | Attending: Family Medicine

## 2017-12-03 DIAGNOSIS — I10 Essential (primary) hypertension: Secondary | ICD-10-CM

## 2017-12-03 NOTE — Progress Notes (Signed)
Patient here for lab visit only 

## 2017-12-04 LAB — LIPID PANEL
CHOLESTEROL TOTAL: 169 mg/dL (ref 100–199)
Chol/HDL Ratio: 3.8 ratio (ref 0.0–4.4)
HDL: 44 mg/dL (ref >39)
LDL CALC: 98 mg/dL (ref 0–99)
TRIGLYCERIDES: 136 mg/dL (ref 0–149)
VLDL CHOLESTEROL CAL: 27 mg/dL (ref 5–40)

## 2017-12-04 LAB — CMP14+EGFR
A/G RATIO: 1.7 (ref 1.2–2.2)
ALK PHOS: 87 IU/L (ref 39–117)
ALT: 21 IU/L (ref 0–32)
AST: 20 IU/L (ref 0–40)
Albumin: 4.5 g/dL (ref 3.5–5.5)
BUN/Creatinine Ratio: 23 (ref 9–23)
BUN: 21 mg/dL (ref 6–24)
Bilirubin Total: <0.2 mg/dL (ref 0.0–1.2)
CHLORIDE: 102 mmol/L (ref 96–106)
CO2: 21 mmol/L (ref 20–29)
Calcium: 9.6 mg/dL (ref 8.7–10.2)
Creatinine, Ser: 0.93 mg/dL (ref 0.57–1.00)
GFR calc Af Amer: 79 mL/min/{1.73_m2} (ref >59)
GFR calc non Af Amer: 69 mL/min/{1.73_m2} (ref >59)
GLOBULIN, TOTAL: 2.7 g/dL (ref 1.5–4.5)
Glucose: 79 mg/dL (ref 65–99)
POTASSIUM: 4.8 mmol/L (ref 3.5–5.2)
SODIUM: 139 mmol/L (ref 134–144)
Total Protein: 7.2 g/dL (ref 6.0–8.5)

## 2017-12-05 ENCOUNTER — Telehealth: Payer: Self-pay

## 2017-12-05 NOTE — Telephone Encounter (Signed)
Patient was called and informed of lab results. 

## 2017-12-12 ENCOUNTER — Telehealth: Payer: Self-pay | Admitting: Family Medicine

## 2017-12-12 NOTE — Telephone Encounter (Signed)
Pt. Called requesting to be referred to the dermatologist. Pt. States that she want to be seen because cancer runs in her family and would like for the dermatologist to look at her skin. Pt. Prefers a women. Please f/u

## 2017-12-12 NOTE — Telephone Encounter (Signed)
Pt. Called requesting to be referred to the dermatologist. Pt. States that she want to be seen because cancer runs in her family and would like for the dermatologist to look at her skin. Pt. Prefers a women. Please f/u  °

## 2017-12-12 NOTE — Telephone Encounter (Signed)
Patient was called and informed that Medicaid patient are not covered under the referral program for Dermatologist.

## 2017-12-17 ENCOUNTER — Encounter: Payer: Self-pay | Admitting: Physical Medicine & Rehabilitation

## 2017-12-17 ENCOUNTER — Encounter: Payer: Medicaid Other | Attending: Physical Medicine & Rehabilitation | Admitting: Physical Medicine & Rehabilitation

## 2017-12-17 ENCOUNTER — Ambulatory Visit: Payer: Self-pay | Admitting: Physical Medicine & Rehabilitation

## 2017-12-17 VITALS — BP 123/85 | HR 71 | Ht 60.0 in | Wt 146.0 lb

## 2017-12-17 DIAGNOSIS — F329 Major depressive disorder, single episode, unspecified: Secondary | ICD-10-CM | POA: Insufficient documentation

## 2017-12-17 DIAGNOSIS — F419 Anxiety disorder, unspecified: Secondary | ICD-10-CM | POA: Insufficient documentation

## 2017-12-17 DIAGNOSIS — M6530 Trigger finger, unspecified finger: Secondary | ICD-10-CM | POA: Diagnosis not present

## 2017-12-17 DIAGNOSIS — M654 Radial styloid tenosynovitis [de Quervain]: Secondary | ICD-10-CM

## 2017-12-17 MED ORDER — BUSPIRONE HCL 10 MG PO TABS: 10.0000 mg | ORAL_TABLET | Freq: Two times a day (BID) | ORAL | 3 refills | Status: DC

## 2017-12-17 NOTE — Patient Instructions (Signed)
PLEASE FEEL FREE TO CALL OUR OFFICE WITH ANY PROBLEMS OR QUESTIONS [phone]     I WOULD LIKE FOR YOUR TO BE INVOLVED IN THE COMMUNITY. YOU CAN TRY WORKING ON A LIMITED BASIS BUT I WOULDN'T OVER DO IT.

## 2017-12-17 NOTE — Progress Notes (Signed)
Subjective:    Patient ID: Kelsey Smith, female    DOB: June 02, 1961, 57 y.o.   MRN: [id number]  HPI   Kelsey Smith is here in follow-up of her left thalamic hemorrhage.  I last saw her in November.  We performed a trigger finger injection at that time on her right thumb.  It provided substantial relief.  Now her wrist and finger seem to be bothering her again.  She states that she fell off the wagon briefly but is back in Kelsey Smith and going to meetings.  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  Apparently that went fairly well.  She likes to volunteer as well.  She does feel that in general she is more anxious and wonders if any of her medications need to be adjusted.  Right knee still is giving her some problems and she has an orthopedic appointment set up later this week.  The pain is along the medial aspect of her knee which she has discussed with me before.   Pain Inventory Average Pain 7 Pain Right Now 7 My pain is sharp, dull and aching  In the last 24 hours, has pain interfered with the following? General activity 5 Relation with others 0 Enjoyment of life 5 What TIME of day is your pain at its worst? morning Sleep (in general) Fair  Pain is worse with: walking, bending and some activites Pain improves with: rest, medication and injections Relief from Meds: 7  Mobility walk without assistance ability to climb steps?  yes do you drive?  yes  Function not employed: date last employed .  Neuro/Psych bladder control problems trouble walking depression anxiety  Prior Studies Any changes since last visit?  no  Physicians involved in your care Any changes since last visit?  no   Family History  Problem Relation Age of Onset  . Lung cancer Mother   . Dementia Father   . Brain cancer Brother   . Lung cancer Brother   . Breast cancer Maternal Grandmother   . Breast cancer Paternal Aunt   . Colon cancer  Neg Hx    Social History   Socioeconomic History  . Marital status: Single    Spouse name: Not on file  . Number of children: 0  . Years of education: Not on file  . Highest education level: Not on file  Occupational History  . Occupation: hair stylist  Social Needs  . Financial resource strain: Not on file  . Food insecurity:    Worry: Not on file    Inability: Not on file  . Transportation needs:    Medical: Not on file    Non-medical: Not on file  Tobacco Use  . Smoking status: Former Smoker    Last attempt to quit: 09/24/2016    Years since quitting: 1.2  . Smokeless tobacco: Never Used  Substance and Sexual Activity  . Alcohol use: No    Comment: quit in AA  09-24-2016  . Drug use: No  . Sexual activity: Not on file  Lifestyle  . Physical activity:    Days per week: Not on file    Minutes per session: Not on file  . Stress: Not on file  Relationships  . Social connections:    Talks on phone: Not on file    Gets together: Not on file    Attends religious service: Not on file  Active member of club or organization: Not on file    Attends meetings of clubs or organizations: Not on file    Relationship status: Not on file  Other Topics Concern  . Not on file  Social History Narrative   Pt doing well, (not doing drugs, alcohol, going to AA .     Past Surgical History:  Procedure Laterality Date  . ELBOW FRACTURE SURGERY Left   . GYNECOLOGIC CRYOSURGERY     Past Medical History:  Diagnosis Date  . Alcoholism (HCC)   . Aneurysm, cerebral   . Anxiety   . Arthritis   . Depression   . HLD (hyperlipidemia)   . Hypertension   . Stroke (HCC)    BP 123/85 (BP Location: Right Arm, Patient Position: Sitting, Cuff Size: Normal)   Pulse 71   Ht 5' (1.524 m)   Wt 146 lb (66.2 kg)   SpO2 97%   BMI 28.51 kg/m   Opioid Risk Score:   Fall Risk Score:  `1  Depression screen PHQ 2/9  Depression screen [facility] 2/9 08/30/2017 04/16/2017 02/28/2017 11/28/2016 11/06/2016  10/29/2016  Decreased Interest 1 1 1 1  0 0  Down, Depressed, Hopeless 1 1 1 1  0 0  PHQ - 2 Score 2 2 2 2  0 0  Altered sleeping 3 - 3 2 - -  Tired, decreased energy 1 - 1 1 - -  Change in appetite 0 - 0 0 - -  Feeling bad or failure about yourself  1 - 1 1 - -  Trouble concentrating 2 - 1 2 - -  Moving slowly or fidgety/restless 1 - 1 0 - -  Suicidal thoughts 0 - 0 0 - -  PHQ-9 Score 10 - 9 8 - -    Review of Systems  Constitutional: Negative.   HENT: Negative.   Eyes: Negative.   Respiratory: Negative.   Cardiovascular: Negative.   Gastrointestinal: Negative.   Genitourinary: Positive for difficulty urinating.  Musculoskeletal: Positive for arthralgias and gait problem.  Allergic/Immunologic: Negative.   Hematological: Negative.   Psychiatric/Behavioral: Positive for dysphoric mood. The patient is nervous/anxious.   All other systems reviewed and are negative.      Objective:   Physical Exam  General: No acute distress HEENT: EOMI, oral membranes moist Cards: reg rate  Chest: normal effort Abdomen: Soft, NT, ND Skin: dry, intact Extremities: no edema  Musculoskeletal: Positive tenderness Kirsteins test positive once again.  Along APLand EPB.    She also has pain at the base of the right thumb with a catch noted during flexion and extension..  Neurological:   Continued mild sensory loss along the right arm and leg with light touch and proprioception.  Motor: motor function nearly 5/5 in all 4's.  Reasonable insight and awareness.  Language and memory are functional. Skin: Skin is warm and dry. Scar right knee Psychiatric: She is anxious today but pleasant      Assessment & Plan:  Medical Problem List and Plan: 1. 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 -We discussed doing some work as a Interior and spatial designer.  I think that she could try some limited work for a few hours a week if that  something she wants to pursue.  I told her she needs to be careful not to overdo things..                3. Right and left leg pain -?meniscal injury right knee??  -  continue ice, knee sleeve, tylenol. - exercise as possible, aquatics are the best. -Orthopedic consult pending. -Consider further imaging based on her orthopedic visit 4. Mood:  -on buspar and paxil currently.  Increase BuSpar to 10 mg twice daily 5. Polysubstance abuse: continue AA -Select risk for relapse.  Encouraged her to maintain her AA involvement 6. Right wrist pain: predominantly de quervain's tenosynovitis. Patient also with right thumb pain and trigger finger -nerve pain related to thalamic injury -After informed consent and preparation of the skin with betadine and isopropyl alcohol, I injected 0.5mg  of celestone and 2cc of 1% lidocaine around  the proximal pulley of the right thumb via anterior approach. Additionally, aspiration was performed prior to injection. The patient tolerated well, and no complications were encountered. Afterward the area was cleaned and dressed. Post- injection instructions were provided.             -After informed consent and preparation of the skin with betadine and isopropyl alcohol, I injected 0.5mg  of celestone and 2cc of 1% lidocaine around  the right APL/EPB tendons via lateral approach. Additionally, aspiration was performed prior to injection. The patient tolerated well, and no complications were encountered. Afterward the area was cleaned and dressed. Post- injection instructions were provided.   -continue ice to wrist/splint (night time at least) -can use diclofenac prn  Follow up in23months.15 minutes of face to face patient care time were spent during this visit. All questions were encouraged and  answered.

## 2017-12-19 ENCOUNTER — Other Ambulatory Visit (INDEPENDENT_AMBULATORY_CARE_PROVIDER_SITE_OTHER): Payer: Self-pay

## 2017-12-19 ENCOUNTER — Ambulatory Visit (INDEPENDENT_AMBULATORY_CARE_PROVIDER_SITE_OTHER): Payer: Medicaid Other

## 2017-12-19 ENCOUNTER — Ambulatory Visit (INDEPENDENT_AMBULATORY_CARE_PROVIDER_SITE_OTHER): Payer: Medicaid Other | Admitting: Orthopaedic Surgery

## 2017-12-19 DIAGNOSIS — M25561 Pain in right knee: Secondary | ICD-10-CM

## 2017-12-19 DIAGNOSIS — G8929 Other chronic pain: Secondary | ICD-10-CM
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
## 2017-12-24 ENCOUNTER — Encounter: Payer: Self-pay | Admitting: Physical Therapy

## 2017-12-24 ENCOUNTER — Ambulatory Visit: Payer: Medicaid Other | Attending: Family Medicine | Admitting: Physical Therapy

## 2017-12-24 DIAGNOSIS — I619 Nontraumatic intracerebral hemorrhage, unspecified: Secondary | ICD-10-CM | POA: Insufficient documentation

## 2017-12-24 DIAGNOSIS — M25561 Pain in right knee: Secondary | ICD-10-CM | POA: Diagnosis present

## 2017-12-24 DIAGNOSIS — M25661 Stiffness of right knee, not elsewhere classified: Secondary | ICD-10-CM | POA: Insufficient documentation

## 2017-12-24 DIAGNOSIS — R600 Localized edema: Secondary | ICD-10-CM | POA: Diagnosis present

## 2017-12-24 DIAGNOSIS — R110 Nausea: Secondary | ICD-10-CM | POA: Insufficient documentation

## 2017-12-24 DIAGNOSIS — R2681 Unsteadiness on feet: Secondary | ICD-10-CM | POA: Diagnosis present

## 2017-12-24 DIAGNOSIS — G8929 Other chronic pain: Secondary | ICD-10-CM | POA: Diagnosis present

## 2017-12-24 DIAGNOSIS — M6281 Muscle weakness (generalized): Secondary | ICD-10-CM | POA: Diagnosis present

## 2017-12-24 NOTE — Patient Instructions (Signed)
    Copyright  VHI. All rights reserved.  Step-Up: Lateral   Step up to side with right leg. Bring other foot up onto _6___ inch step. Return to floor position with left leg. Repeat _15 x 2___ times per session. Do _2___ sessions per day.   Copyright  VHI. All rights reserved.  Forward   Facing step, place one leg on step, flexed at hip. Step up slowly, bringing hips in line with knee and shoulder. Bring other foot onto step. Reverse process to step back down. Repeat with other leg. Do _15 x 2___ repetitions, 2 x a day  Kelsey Smith, PT Certified Exercise Expert for the Aging Adult  12/24/17 8:36 AM Phone: [phone] Fax: [phone]
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
## 2018-01-06 ENCOUNTER — Ambulatory Visit: Payer: Medicaid Other | Admitting: Physical Therapy

## 2018-01-06 ENCOUNTER — Encounter: Payer: Self-pay | Admitting: Physical Therapy

## 2018-01-06 DIAGNOSIS — R600 Localized edema: Secondary | ICD-10-CM

## 2018-01-06 DIAGNOSIS — R2681 Unsteadiness on feet: Secondary | ICD-10-CM

## 2018-01-06 DIAGNOSIS — M25561 Pain in right knee: Secondary | ICD-10-CM

## 2018-01-06 DIAGNOSIS — M25661 Stiffness of right knee, not elsewhere classified: Secondary | ICD-10-CM

## 2018-01-06 DIAGNOSIS — M6281 Muscle weakness (generalized): Secondary | ICD-10-CM

## 2018-01-06 DIAGNOSIS — G8929 Other chronic pain: Secondary | ICD-10-CM

## 2018-01-06 DIAGNOSIS — I619 Nontraumatic intracerebral hemorrhage, unspecified: Secondary | ICD-10-CM

## 2018-01-06 DIAGNOSIS — R110 Nausea: Secondary | ICD-10-CM

## 2018-01-06 NOTE — Therapy (Signed)
[facility] Outpatient Rehabilitation [facility] [address] [place], [place], [address] Phone: [phone]   Fax:  [phone]  Physical Therapy Treatment  Patient Details  Name: Kelsey Smith MRN: [id number] Date of Birth: [birth year] Referring Provider: Gershon Mussel MD   Encounter Date: 01/06/2018  PT End of Session - 01/06/18 1306    Visit Number  2    Number of Visits  4    Date for PT Re-Evaluation  01/21/18    Authorization Type  MCD-     Authorization Time Period  01/03/2018-02/01/2018    Authorization - Visit Number  1    Authorization - Number of Visits  3    PT Start Time  0100    PT Stop Time  0140    PT Time Calculation (min)  40 min       Past Medical History:  Diagnosis Date  . Alcoholism (HCC)   . Aneurysm, cerebral   . Anxiety   . Arthritis   . Depression   . HLD (hyperlipidemia)   . Hypertension   . Stroke [facility]     Past Surgical History:  Procedure Laterality Date  . ELBOW FRACTURE SURGERY Left   . GYNECOLOGIC CRYOSURGERY      There were no vitals filed for this visit.  Subjective Assessment - 01/06/18 1259    Currently in Pain?  Yes    Pain Score  5     Pain Location  Knee    Pain Orientation  Right;Medial                       [facility] Adult PT Treatment/Exercise - 01/06/18 0001      Standardized Balance Assessment   Standardized Balance Assessment  Berg Balance Test      Berg Balance Test   Sit to Stand  Able to stand without using hands and stabilize independently    Standing Unsupported  Able to stand safely 2 minutes    Sitting with Back Unsupported but Feet Supported on Floor or Stool  Able to sit safely and securely 2 minutes    Stand to Sit  Sits safely with minimal use of hands    Transfers  Able to transfer safely, minor use of hands    Standing Unsupported with Eyes Closed  Able to stand 10 seconds safely    Standing Ubsupported with Feet Together  Able to place feet together independently  and stand 1 minute safely    From Standing, Reach Forward with Outstretched Arm  Can reach confidently >25 cm (10")    From Standing Position, Pick up Object from Floor  Able to pick up shoe safely and easily    From Standing Position, Turn to Look Behind Over each Shoulder  Looks behind from both sides and weight shifts well    Turn 360 Degrees  Able to turn 360 degrees safely in 4 seconds or less    Standing Unsupported, Alternately Place Feet on Step/Stool  Able to stand independently and safely and complete 8 steps in 20 seconds    Standing Unsupported, One Foot in Front  Able to place foot tandem independently and hold 30 seconds    Standing on One Leg  Able to lift leg independently and hold > 10 seconds    Total Score  56      Knee/Hip Exercises: Stretches   Active Hamstring Stretch  3 reps;30 seconds    Quad Stretch  3 reps;30 seconds  Knee/Hip Exercises: Aerobic   Nustep  L4 LE x 5 min      Knee/Hip Exercises: Standing   Lateral Step Up  Right;10 reps;Step Height: 6" x2, HHA    Forward Step Up  Right;2 sets;10 reps HHA      Knee/Hip Exercises: Supine   Quad Sets  10 reps    Short Arc Quad Sets  10 reps with ball squeeze    Bridges with [employer]  10 reps    Straight Leg Raises  10 reps    Straight Leg Raise with External Rotation  10 reps    Other Supine Knee/Hip Exercises  ball squeeze x 10       Knee/Hip Exercises: Sidelying   Clams  x10               PT Short Term Goals - 12/24/17 0830      PT SHORT TERM GOAL #1   Title  Pt will be independent with inital HEP    Baseline  Pt with no knowledge of appropriate exercises to progress    Time  4    Period  Weeks    Status  New    Target Date  01/21/18      PT SHORT TERM GOAL #2   Title  Pt will do BERG balance test and set LTG     Baseline  Pt demonstrates unsteadiness on feet and wide based gait    Time  4    Period  Weeks    Status  New    Target Date  01/21/18      PT SHORT TERM GOAL #3    Title  Pt will be able to negotiate steps with with step over step technique and minimal UE support    Baseline  Pt must use both UE and negotiates steps one step at a time    Time  4    Period  Weeks    Status  New    Target Date  01/21/18      PT SHORT TERM GOAL #4   Title  flexion in right knee to improve to 145 degree flexion in order to improve quality of gait with normal stride length     Baseline  Pt with wide based stance in gait and decreased stride length on left due to decrease stance time on right/weakness. Eval right knee flex 137/tight    Time  4    Period  Weeks    Status  New    Target Date  01/21/18        PT Long Term Goals - 12/24/17 0830      PT LONG TERM GOAL #1   Title  Pt will be independent with advanced HEP    Time  6    Period  Weeks    Status  New    Target Date  02/04/18      PT LONG TERM GOAL #2   Title  gross LE strength to at least 4+/5  to improve gait and functional mobility    Baseline  Pt with antalgic gait and wide based for balance, MMT 3-/5 to 4/5    Time  6    Period  Weeks    Status  New    Target Date  02/04/18      PT LONG TERM GOAL #3   Title  Pt will be able to perform 15 squats from chair to show improved better motor controll with  sit to stand without need for UE support    Baseline  Pt uses UE support to rise from sitting to standing    Time  6    Period  Weeks    Status  New    Target Date  02/04/18      PT LONG TERM GOAL #4   Title  Pt will be able to ambulate around shops/grocery stores for 1 hour before taking a break due to fatigue    Baseline  Pt unable to stand > 30 mintes,  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 Focused Basic Knee exercises and stretches. Updated HEP. Encouraged her to wear sneakers.     PT Next Visit Plan  Manual for scar mobilization,  basic knee strength.  quad strength.  DO BERG for balance.     PT Home Exercise Plan  knee step ups and lateral step ups, SLR with ER, quad set     Consulted and Agree with Plan of Care  Patient       Patient will benefit from skilled therapeutic intervention in order to improve the following deficits and impairments:  Abnormal gait, Decreased balance, Decreased range of motion, Decreased mobility, Decreased scar mobility, Pain, Increased fascial restricitons, Difficulty walking, Decreased strength  Visit Diagnosis: Muscle weakness (generalized)  Chronic pain of right knee  Unsteadiness on feet  Localized edema  Stiffness of right knee, not elsewhere classified  Nausea  Intraparenchymal hemorrhage of brain (HCC) - L thalamic     Problem List Patient Active Problem List   Diagnosis Date Noted  . Paresthesia 11/29/2017  . Hyperlipidemia 10/21/2017  . Overactive bladder 08/30/2017  . Trigger finger, acquired 08/21/2017  . De Quervain's tenosynovitis, right 04/16/2017  . Carpal tunnel syndrome 02/28/2017  . Insomnia 02/28/2017  . Anxiety and depression 02/28/2017  . Benign essential HTN   . Right hemiparesis (HCC)   . Slow transit constipation   . Lethargy   . IVH (intraventricular hemorrhage) (HCC) 09/26/2016  . B12 deficiency 09/26/2016  . Hypertensive emergency 09/26/2016  . Essential hypertension 09/26/2016  . Carotid aneurysm, left (HCC) 09/26/2016  . Open thigh wound 09/26/2016  . Somnolence   . Recurrent major depressive disorder (HCC)   . ETOH abuse   . Tobacco abuse   . Cocaine abuse (HCC)   . Pure hypercholesterolemia   . 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

## 2018-01-07 NOTE — Progress Notes (Signed)
Personally  participated in, made any corrections needed, and agree with history, physical, neuro exam,assessment and plan as stated above.    Antonia Ahern, MD [facility] 

## 2018-01-09 ENCOUNTER — Telehealth: Payer: Self-pay | Admitting: Family Medicine

## 2018-01-09 ENCOUNTER — Ambulatory Visit: Payer: Medicaid Other | Admitting: Physical Therapy

## 2018-01-09 NOTE — Telephone Encounter (Signed)
Pt called to request BP management

## 2018-01-13 ENCOUNTER — Ambulatory Visit: Payer: Medicaid Other | Admitting: Physical Therapy

## 2018-01-16 ENCOUNTER — Encounter: Payer: Self-pay | Admitting: Physical Therapy

## 2018-01-16 ENCOUNTER — Ambulatory Visit: Payer: Medicaid Other | Admitting: Physical Therapy

## 2018-01-16 DIAGNOSIS — M6281 Muscle weakness (generalized): Secondary | ICD-10-CM

## 2018-01-16 DIAGNOSIS — M25561 Pain in right knee: Secondary | ICD-10-CM

## 2018-01-16 DIAGNOSIS — R2681 Unsteadiness on feet: Secondary | ICD-10-CM

## 2018-01-16 DIAGNOSIS — R600 Localized edema: Secondary | ICD-10-CM

## 2018-01-16 DIAGNOSIS — G8929 Other chronic pain: Secondary | ICD-10-CM

## 2018-01-16 DIAGNOSIS — M25661 Stiffness of right knee, not elsewhere classified: Secondary | ICD-10-CM

## 2018-01-16 NOTE — Therapy (Signed)
[facility] [facility] [address] [place], [place], [address] Phone: [phone]   Fax:  [phone]  Physical Therapy Treatment  Patient Details  Name: Kelsey Smith MRN: [id number] Date of Birth: 1961-07-24 Referring Provider: Gershon MusselXU, Naiping MD   Encounter Date: 01/16/2018  PT End of Session - 01/16/18 0802    Visit Number  3    Number of Visits  4    Date for PT Re-Evaluation  01/21/18    Authorization Type  MCD-     Authorization Time Period  01/03/2018-02/01/2018    Authorization - Visit Number  2    Authorization - Number of Visits  3    PT Start Time  0800    Activity Tolerance  Patient tolerated treatment well    Behavior During Therapy  [facility] for tasks assessed/performed       Past Medical History:  Diagnosis Date  . Alcoholism (HCC)   . Aneurysm, cerebral   . Anxiety   . Arthritis   . Depression   . HLD (hyperlipidemia)   . Hypertension   . Stroke [facility](HCC)     Past Surgical History:  Procedure Laterality Date  . ELBOW FRACTURE SURGERY Left   . GYNECOLOGIC CRYOSURGERY      There were no vitals filed for this visit.  Subjective Assessment - 01/16/18 0805    Subjective  I have been doing all my exercises.  Walking a lot. and I was in [place] with a friend going to her MD appt.     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  unlimited    How long can you stand comfortably?  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  15 minutes then fatigues and my legs drag    Diagnostic tests  MRA    Patient Stated Goals  I want to strengthen my leg and get rid of my limp in my right leg. 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      Currently in  Pain?  Yes    Pain Score  5     Pain Location  Knee    Pain Orientation  Right    Pain Descriptors / Indicators  Tingling    Pain Type  Chronic pain    Pain Onset  More than a month ago         [facility] - 01/16/18 0823      AROM   Right Knee Extension  0    Right Knee Flexion  141                   [facility] Adult PT Treatment/Exercise - 01/16/18 0823      Ambulation/Gait   Stairs  Yes    Stairs Assistance  7: Independent    Stair Management Technique  One rail Left;Alternating pattern    Number of Stairs  8    Height of Stairs  6      Self-Care   Self-Care  Other Self-Care Comments    Other Self-Care Comments   scar tissue massage self  and return demo      Knee/Hip Exercises: Stretches  Active Hamstring Stretch  3 reps;30 seconds    Quad Stretch  3 reps;30 seconds      Knee/Hip Exercises: Aerobic   Nustep  L4 LE x 5 min       Knee/Hip Exercises: Standing   Knee Flexion  15 reps 2 lb left    Lateral Step Up  Right;Step Height: 6";15 reps x2, HHA    Forward Step Up  Right;2 sets;15 reps HHA    Other Standing Knee Exercises  sink squats x 15      Knee/Hip Exercises: Seated   Long Arc Quad  15 reps;Left 2 lb left      Knee/Hip Exercises: Supine   Quad Sets  20 reps    Short Arc Quad Sets  Strengthening;15 reps with ball squeezes    Bridges with [employer]  15 reps    Straight Leg Raises  15 reps    Straight Leg Raise with External Rotation  15 reps    Other Supine Knee/Hip Exercises  ball squeeze x 15 adductor      Manual Therapy   Manual Therapy  Soft tissue mobilization    Soft tissue mobilization  IASTYM to right scar.  educated on scar tissue massage of scar and medial hamstring of right             PT Education - 01/16/18 0818    Education provided  Yes    Education Details  education on scar tissue massage  added knee exercises with weights to HEP    Person(s) Educated  Patient    Methods  Verbal  cues;Demonstration;Explanation    Comprehension  Verbalized understanding;Returned demonstration       PT Short Term Goals - 01/16/18 0805      PT SHORT TERM GOAL #1   Title  Pt will be independent with inital HEP    Baseline  Pt given initial home exercise program    Time  4    Period  Weeks    Status  On-going      PT SHORT TERM GOAL #2   Title  Pt will do BERG balance test and set LTG     Baseline  Pt with 56/56 BERG  no need for assistive devise or balance issue    Time  4    Period  Weeks    Status  Achieved      PT SHORT TERM GOAL #3   Title  Pt will be able to negotiate steps with with step over step technique and minimal UE support    Baseline  Pt using left UE and alternating steps x 8 steps    Time  4    Period  Weeks    Status  Achieved      PT SHORT TERM GOAL #4   Title  flexion in right knee to improve to 145 degree flexion in order to improve quality of gait with normal stride length     Baseline  Pt with right 141 flexion 01-16-18    Time  4    Period  Weeks    Status  On-going        PT Long Term Goals - 12/24/17 0830      PT LONG TERM GOAL #1   Title  Pt will be independent with advanced HEP    Time  6    Period  Weeks    Status  New    Target Date  02/04/18  PT LONG TERM GOAL #2   Title  gross LE strength to at least 4+/5  to improve gait and functional mobility    Baseline  Pt with antalgic gait and wide based for balance, MMT 3-/5 to 4/5    Time  6    Period  Weeks    Status  New    Target Date  02/04/18      PT LONG TERM GOAL #3   Title  Pt will be able to perform 15 squats from chair to show improved better motor controll with sit to stand without need for UE support    Baseline  Pt uses UE support to rise from sitting to standing    Time  6    Period  Weeks    Status  New    Target Date  02/04/18      PT LONG TERM GOAL #4   Title  Pt will be able to ambulate around shops/grocery stores for 1 hour before taking a break due to  fatigue    Baseline  Pt unable to stand > 30 mintes,  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  Pt BERG is 56/56 and no need for LTG for balance.  Pt mostly limited by LE weakness and fatigue.  Pt is on target for achieving for all STG's next visit.Marland Kitchen    PT Frequency  2x / week 1st 4 visits under initial authorization    PT Duration  6 weeks    PT Treatment/Interventions  ADLs/Self Care Home Management;Cryotherapy;Electrical Stimulation;Iontophoresis 4mg /ml Dexamethasone;Moist Heat;Ultrasound;Therapeutic exercise;Therapeutic activities;Functional mobility training;Gait training;Stair training;Neuromuscular re-education;Patient/family education;Scar mobilization;Manual techniques;Passive range of motion;Dry needling;Taping;Vasopneumatic Device    PT Next Visit Plan  RE authorization 01-23-18 continue knee strength standing.    PT Home Exercise Plan  knee step ups and lateral step ups, SLR with ER, quad set .        Patient will benefit from skilled therapeutic intervention in order to improve the following deficits and impairments:  Abnormal gait, Decreased balance, Decreased range of motion, Decreased mobility, Decreased scar mobility, Pain, Increased fascial restricitons, Difficulty walking, Decreased strength  Visit Diagnosis: Muscle weakness (generalized)  Chronic pain of right knee  Unsteadiness on feet  Localized edema  Stiffness of right knee, not elsewhere classified     Problem List Patient Active Problem List   Diagnosis Date Noted  .  Paresthesia 11/29/2017  . Hyperlipidemia 10/21/2017  . Overactive bladder 08/30/2017  . Trigger finger, acquired 08/21/2017  . De Quervain's tenosynovitis, right 04/16/2017  . Carpal tunnel syndrome 02/28/2017  . Insomnia 02/28/2017  . Anxiety and depression 02/28/2017  . Benign essential HTN   . Right hemiparesis (HCC)   . Slow transit constipation   . Lethargy   . IVH (  intraventricular hemorrhage) (HCC) 09/26/2016  . B12 deficiency 09/26/2016  . Hypertensive emergency 09/26/2016  . Essential hypertension 09/26/2016  . Carotid aneurysm, left (HCC) 09/26/2016  . Open thigh wound 09/26/2016  . Somnolence   . Recurrent major depressive disorder (HCC)   . ETOH abuse   . Tobacco abuse   . Cocaine abuse (HCC)   . Pure hypercholesterolemia   . 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 [place], [place], [address] Phone: [phone]   Fax:  [phone]  Name: Kelsey Smith MRN: [id number] Date of Birth: 16-May-1961

## 2018-01-16 NOTE — Patient Instructions (Addendum)
  Remember to do Sink Squats x 15 x 2  As shown in clinic   Copyright  VHI. All rights reserved.  Heel Slide     Raise leg until knee is straight. 15_ reps per set, 2___ sets per day, _7__ days per week May add weights Copyright  VHI. All rights reserved.  Short Arc Arrow ElectronicsQuad   Place a large can or rolled towel under leg. Straighten knee and leg. Hold _3___ seconds. Repeat with other leg. Repeat __15__ times. Do _2___ sessions per day. Can add weight as able  http://gt2.exer.us/365   Copyright  VHI. All rights reserved.  Quad Set   Slowly tighten muscles on thigh of straight leg while counting out loud to __5__. Repeat with other leg. Repeat _30___ times. Do __2__ sessions per day.  http://gt2.exer.us/361   Copyright  VHI. All rights reserved.   Copyright  VHI. All rights reserved.   Garen LahLawrie Beardsley, PT Certified Exercise Expert for the Aging Adult  01/16/18 8:15 AM Phone: [phone] Fax: [phone]

## 2018-01-21 ENCOUNTER — Encounter: Payer: Medicaid Other | Admitting: Physical Therapy

## 2018-01-23 ENCOUNTER — Encounter: Payer: Self-pay | Admitting: Physical Therapy

## 2018-01-23 ENCOUNTER — Ambulatory Visit: Payer: Medicaid Other | Attending: Family Medicine | Admitting: Physical Therapy

## 2018-01-23 DIAGNOSIS — M6281 Muscle weakness (generalized): Secondary | ICD-10-CM | POA: Diagnosis not present

## 2018-01-23 DIAGNOSIS — R600 Localized edema: Secondary | ICD-10-CM

## 2018-01-23 DIAGNOSIS — M25661 Stiffness of right knee, not elsewhere classified: Secondary | ICD-10-CM | POA: Diagnosis present

## 2018-01-23 DIAGNOSIS — G8929 Other chronic pain: Secondary | ICD-10-CM | POA: Insufficient documentation

## 2018-01-23 DIAGNOSIS — M25561 Pain in right knee: Secondary | ICD-10-CM | POA: Insufficient documentation

## 2018-01-23 DIAGNOSIS — R2681 Unsteadiness on feet: Secondary | ICD-10-CM | POA: Diagnosis present

## 2018-01-23 NOTE — Patient Instructions (Signed)
    Achilles / Gastroc, Standing   Stand, right foot behind, heel on floor and turned slightly out, leg straight, forward leg bent. Move hips forward. Hold _30__ seconds. Repeat _3__ times per session. Do _3__ sessions per day.   Stretching: Soleus   Stand with right foot back, both knees bent. Keeping heel on floor, turned slightly out, lean into wall until stretch is felt in lower calf. Hold _30___ seconds. Repeat __3__ times per set. Do __3__ sessions per day.  OR you can do the step gastroc stretch   http://orth.exer.us/82   Gastroc / Heel Cord Stretch - On Step   Stand with heels over edge of stair. Holding rail, lower heels until stretch is felt in calf of legs. Hold 30 secs.  Repeat __3_ times. Do __3_ times per day. Knee Extension: Terminal - Standing (Single Leg)    Face anchor in shoulder width stance, band around knee. Allow tension of band to slightly bend knee. Pull leg back, straightening knee. Repeat _15 times per set. Repeat with other leg. Do _2_ sets per session. Do _2_ sessions per day Anchor Height: Knee  http://tub.exer.us/36   Copyright  VHI. All rights reserved.   Heel Raise: Bilateral (Standing)    Rise on balls of feet. Repeat __15__ times per set. Do __2__ sets per session. Do __2__ sessions per day.  http://orth.exer.us/39   Copyright  VHI. All rights reserved.   Garen Lah, PT Certified Exercise Expert for the Aging Adult  01/23/18 8:42 AM Phone: [phone] Fax: [phone]

## 2018-01-23 NOTE — Therapy (Signed)
[facility] [address] [place], [place], [address] Phone: [phone]   Fax:  [phone]  Physical Therapy Treatment/Reauthorization /Recertification  Patient Details  Name: Kelsey Smith MRN: [id number] Date of Birth: 1961/08/18 Referring Provider: Gershon Mussel MD   Encounter Date: 01/23/2018  PT End of Session - 01/23/18 0805    Visit Number  4    Date for PT Re-Evaluation  03/06/18 end of 4th visit 4/30/-19    Authorization Type  MCD- resubmittid    PT Start Time  0800    PT Stop Time  0843    PT Time Calculation (min)  43 min    Activity Tolerance  Patient tolerated treatment well    Behavior During Therapy  [facility] for tasks assessed/performed       Past Medical History:  Diagnosis Date  . Alcoholism (HCC)   . Aneurysm, cerebral   . Anxiety   . Arthritis   . Depression   . HLD (hyperlipidemia)   . Hypertension   . Stroke [facility])     Past Surgical History:  Procedure Laterality Date  . ELBOW FRACTURE SURGERY Left   . GYNECOLOGIC CRYOSURGERY      There were no vitals filed for this visit.  Subjective Assessment - 01/23/18 0808    Subjective  I was at a fish fry and I was standing off and on for 5 hours.  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  unlimited    How long can you stand comfortably?  45 minutes one time at a fish fry    How long can you walk comfortably?  20 minutes at one times    Diagnostic tests  MRA    Patient Stated Goals  I want to strengthen my leg and get rid of my limp in my right leg. 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      Currently in Pain?  Yes    Pain Score  5     Pain Location  Knee    Pain Orientation  Right    Pain Descriptors / Indicators  Aching;Tender    Pain Type  Chronic pain    Pain Onset  More than a month ago    Pain Frequency  Constant         [facility] PT Assessment - 01/23/18 0811      Assessment   Referring Provider  Gershon Mussel MD      AROM   Right Knee Extension  0    Right Knee Flexion  141      Strength   Right Hip Flexion  4-/5    Right Hip Extension  4-/5    Right Hip ABduction  3/5    Left Hip Flexion  4+/5    Left Hip Extension  4+/5    Left Hip ABduction  4/5    Right Knee Flexion  4/5    Right Knee Extension  4/5    Left Knee Flexion  5/5    Left Knee Extension  5/5    Right Ankle Dorsiflexion  5/5    Right Ankle Plantar Flexion  5/5    Left Ankle Dorsiflexion  4-/5    Left Ankle Plantar  Flexion  4-/5                   [facility] Adult PT Treatment/Exercise - 01/23/18 0800      Ambulation/Gait   Stairs  Yes    Stairs Assistance  7: Independent    Stair Management Technique  One rail Right;Alternating pattern    Number of Stairs  16    Height of Stairs  6               PT Short Term Goals - 01/23/18 0981      PT SHORT TERM GOAL #1   Title  Pt will be independent with inital HEP    Baseline  Pt returned demo of initial HEP independently    Time  4    Period  Weeks    Status  Achieved      PT SHORT TERM GOAL #2   Title  Pt will do BERG balance test and set LTG     Baseline  Pt with 56/56 BERG  no need for assistive devise or balance issue    Time  4    Period  Weeks    Status  Achieved      PT SHORT TERM GOAL #3   Title  Pt will be able to negotiate steps with with step over step technique and minimal UE support    Baseline  Pt able to negotiate steps with minimal use of UE and alternating steps x 8 steps    Time  4    Period  Weeks    Status  Achieved      PT SHORT TERM GOAL #4   Title  flexion in right knee to improve to 145 degree flexion in order to improve quality of gait with normal stride length     Baseline  Pt right knee 147 flexion 01-23-18    Time  4    Period   Weeks    Status  Achieved        PT Long Term Goals - 01/23/18 1748      PT LONG TERM GOAL #1   Title  Pt will be independent with advanced HEP    Time  6    Period  Weeks    Status  On-going    Target Date  03/06/18      PT LONG TERM GOAL #2   Title  gross LE strength to at least 4+/5  to improve gait and functional mobility    Baseline  Pt with antalgic gait and wide based for balance, MMT 3-/5 to 4/5    Time  6    Period  Weeks    Status  On-going    Target Date  03/06/18      PT LONG TERM GOAL #3   Title  Pt will be able to perform 15 squats from chair to show improved better motor controll with sit to stand without need for UE support    Baseline  Pt uses UE support to rise from sitting to standing and has 5/10 pain in right knee    Time  6    Period  Weeks    Status  On-going    Target Date  03/06/18      PT LONG TERM GOAL #4   Title  Pt will be able to ambulate around shops/grocery stores for 1 hour before taking a break due to fatigue    Baseline  Pt was able  to walk for 20 minutes but was able to stand this past weekend for a fish fry for no longer than 45 minutes    Time  6    Period  Weeks    Status  On-going    Target Date  03/06/18      PT LONG TERM GOAL #5   Title  Average pain with ADL's </ = 3/10    Baseline  Pain about 5/10 now right knee    Time  6    Period  Weeks    Status  On-going    Target Date  03/06/18            Plan - 01/23/18 0806    Clinical Impression Statement  Pt enters clinic without assistive device and tennis shoes, able to negotiate stesp with left UE support.  Pt knee MMT right 4/5 and is improving in standing tolerance able to stand for reacreational event up to 45 minutes.  she can only walk 20 minutes without fatigueing.  right knee AROM 0-141,  pt walks with antalgic gait and habitually bends knee on heel strike.  Continuing to work on gait and strengthening.  Improving in MMT in bil LE but will benefit from additional  time to complete and achieve LTG's . All STG's achieved       Rehab Potential  Good    PT Frequency  2x / week    PT Duration  6 weeks    PT Treatment/Interventions  ADLs/Self Care Home Management;Cryotherapy;Electrical Stimulation;Iontophoresis /ml Dexamethasone;Moist Heat;Ultrasound;Therapeutic exercise;Therapeutic activities;Functional mobility training;Gait training;Stair training;Neuromuscular re-education;Patient/family education;Scar mobilization;Manual techniques;Passive range of motion;Dry needling;Taping;Vasopneumatic Device    PT Next Visit Plan  re authorization/ re certification done 01-23-18     PT Home Exercise Plan  knee step ups and lateral step ups, SLR with ER, quad set . gastroc stretch, TKE,steps gastroc stretch bil heel raise    Consulted and Agree with Plan of Care  Patient       Patient will benefit from skilled therapeutic intervention in order to improve the following deficits and impairments:  Abnormal gait, Decreased balance, Decreased range of motion, Decreased mobility, Decreased scar mobility, Pain, Increased fascial restricitons, Difficulty walking, Decreased strength  Visit Diagnosis: Muscle weakness (generalized)  Chronic pain of right knee  Unsteadiness on feet  Localized edema  Stiffness of right knee, not elsewhere classified     Problem List Patient Active Problem List   Diagnosis Date Noted  . Paresthesia 11/29/2017  . Hyperlipidemia 10/21/2017  . Overactive bladder 08/30/2017  . Trigger finger, acquired 08/21/2017  . De Quervain's tenosynovitis, right 04/16/2017  . Carpal tunnel syndrome 02/28/2017  . Insomnia 02/28/2017  . Anxiety and depression 02/28/2017  . Benign essential HTN   . Right hemiparesis (HCC)   . Slow transit constipation   . Lethargy   . IVH (intraventricular hemorrhage) (HCC) 09/26/2016  . B12 deficiency 09/26/2016  . Hypertensive emergency 09/26/2016  . Essential hypertension 09/26/2016  . Carotid aneurysm, left  (HCC) 09/26/2016  . Open thigh wound 09/26/2016  . Somnolence   . Recurrent major depressive disorder (HCC)   . ETOH abuse   . Tobacco abuse   . Cocaine abuse (HCC)   . Pure hypercholesterolemia   . 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 [place], [place], [address]  Phone: [phone]   Fax:  [phone]  Name: Kelsey L Hur MRN: [id number] Date of Birth: [birth year]

## 2018-01-27 ENCOUNTER — Encounter: Payer: Medicaid Other | Admitting: Physical Therapy

## 2018-01-30 ENCOUNTER — Encounter: Payer: Medicaid Other | Admitting: Physical Therapy

## 2018-02-03 ENCOUNTER — Ambulatory Visit: Payer: Medicaid Other | Admitting: Physical Therapy

## 2018-02-03 ENCOUNTER — Encounter: Payer: Self-pay | Admitting: Physical Therapy

## 2018-02-03 DIAGNOSIS — M25561 Pain in right knee: Secondary | ICD-10-CM

## 2018-02-03 DIAGNOSIS — G8929 Other chronic pain: Secondary | ICD-10-CM

## 2018-02-03 DIAGNOSIS — M6281 Muscle weakness (generalized): Secondary | ICD-10-CM

## 2018-02-03 DIAGNOSIS — R600 Localized edema: Secondary | ICD-10-CM

## 2018-02-03 DIAGNOSIS — M25661 Stiffness of right knee, not elsewhere classified: Secondary | ICD-10-CM

## 2018-02-03 DIAGNOSIS — R2681 Unsteadiness on feet: Secondary | ICD-10-CM

## 2018-02-03 NOTE — Therapy (Signed)
[facility] [address] [place], [place], [address] Phone: [phone]   Fax:  [phone]  Physical Therapy Treatment  Patient Details  Name: Kelsey Smith MRN: [id number] Date of Birth: 05-02-1961 Referring Provider: Gershon Mussel MD   Encounter Date: 02/03/2018  PT End of Session - 02/03/18 0818    Visit Number  5    Number of Visits  4    Date for PT Re-Evaluation  03/06/18    Authorization Type  MCD- Reauthorized     Authorization Time Period  02/03/2018-03/16/2018    Authorization - Visit Number  1    Authorization - Number of Visits  12    PT Start Time  0803    PT Stop Time  0845    PT Time Calculation (min)  42 min       Past Medical History:  Diagnosis Date  . Alcoholism (HCC)   . Aneurysm, cerebral   . Anxiety   . Arthritis   . Depression   . HLD (hyperlipidemia)   . Hypertension   . Stroke [facility])     Past Surgical History:  Procedure Laterality Date  . ELBOW FRACTURE SURGERY Left   . GYNECOLOGIC CRYOSURGERY      There were no vitals filed for this visit.                    [facility] Adult PT Treatment/Exercise - 02/03/18 0001      Knee/Hip Exercises: Stretches   Active Hamstring Stretch  3 reps;30 seconds    Quad Stretch  3 reps;30 seconds    Gastroc Stretch  3 reps;30 seconds      Knee/Hip Exercises: Standing   Lateral Step Up  2 sets;Step Height: 6";Hand Hold: 1;10 reps;Right;Left    Forward Step Up  2 sets;10 reps;Step Height: 6";Hand Hold: 1;Left;Right      Knee/Hip Exercises: Seated   Long Arc Quad  3 sets;10 reps green    Hamstring Curl  10 reps;2 sets green    Sit to Sand  10 reps;without UE support      Knee/Hip Exercises: Supine   Short Arc [employer]  Strengthening;20 reps;Both with ball squeezes    Bridges  20 reps    Bridges with [employer]  20 reps    Straight Leg Raises  20 reps;Both    Straight Leg Raise with External Rotation  20 reps;Both      Knee/Hip  Exercises: Sidelying   Hip ABduction  20 reps;Both               PT Short Term Goals - 01/23/18 6962      PT SHORT TERM GOAL #1   Title  Pt will be independent with inital HEP    Baseline  Pt returned demo of initial HEP independently    Time  4    Period  Weeks    Status  Achieved      PT SHORT TERM GOAL #2   Title  Pt will do BERG balance test and set LTG     Baseline  Pt with 56/56 BERG  no need for assistive devise or balance issue    Time  4    Period  Weeks    Status  Achieved      PT SHORT TERM GOAL #3   Title  Pt will be able to negotiate steps with with step over step technique and minimal UE support    Baseline  Pt able to negotiate steps with minimal use of UE and alternating steps x 8 steps    Time  4    Period  Weeks    Status  Achieved      PT SHORT TERM GOAL #4   Title  flexion in right knee to improve to 145 degree flexion in order to improve quality of gait with normal stride length     Baseline  Pt right knee 147 flexion 01-23-18    Time  4    Period  Weeks    Status  Achieved        PT Long Term Goals - 01/23/18 1748      PT LONG TERM GOAL #1   Title  Pt will be independent with advanced HEP    Time  6    Period  Weeks    Status  On-going    Target Date  03/06/18      PT LONG TERM GOAL #2   Title  gross LE strength to at least 4+/5  to improve gait and functional mobility    Baseline  Pt with antalgic gait and wide based for balance, MMT 3-/5 to 4/5    Time  6    Period  Weeks    Status  On-going    Target Date  03/06/18      PT LONG TERM GOAL #3   Title  Pt will be able to perform 15 squats from chair to show improved better motor controll with sit to stand without need for UE support    Baseline  Pt uses UE support to rise from sitting to standing and has 5/10 pain in right knee    Time  6    Period  Weeks    Status  On-going    Target Date  03/06/18      PT LONG TERM GOAL #4   Title  Pt will be able to ambulate around  shops/grocery stores for 1 hour before taking a break due to fatigue    Baseline  Pt was able to walk for 20 minutes but was able to stand this past weekend for a fish fry for no longer than 45 minutes    Time  6    Period  Weeks    Status  On-going    Target Date  03/06/18      PT LONG TERM GOAL #5   Title  Average pain with ADL's </ = 3/10    Baseline  Pain about 5/10 now right knee    Time  6    Period  Weeks    Status  On-going    Target Date  03/06/18            Plan - 02/03/18 0824    Clinical Impression Statement  Pt reports pain in thighs in the AM that is relieved by doing her exercises. She is using UE to rise from chairs out of habit however can perfom sit-stands without UE in clinic. She is ambulating up and down stairs with reciprocal gait using handrails more coinsistently.     PT Next Visit Plan  reciprocal stairs, closed chain     PT Home Exercise Plan  knee step ups and lateral step ups, SLR with ER, quad set . gastroc stretch, TKE,steps gastroc stretch bil heel raise    Consulted and Agree with Plan of Care  Patient       Patient will benefit from skilled therapeutic  intervention in order to improve the following deficits and impairments:  Abnormal gait, Decreased balance, Decreased range of motion, Decreased mobility, Decreased scar mobility, Pain, Increased fascial restricitons, Difficulty walking, Decreased strength  Visit Diagnosis: Muscle weakness (generalized)  Chronic pain of right knee  Unsteadiness on feet  Localized edema  Stiffness of right knee, not elsewhere classified     Problem List Patient Active Problem List   Diagnosis Date Noted  . Paresthesia 11/29/2017  . Hyperlipidemia 10/21/2017  . Overactive bladder 08/30/2017  . Trigger finger, acquired 08/21/2017  . De Quervain's tenosynovitis, right 04/16/2017  . Carpal tunnel syndrome 02/28/2017  . Insomnia 02/28/2017  . Anxiety and depression 02/28/2017  . Benign essential HTN    . Right hemiparesis (HCC)   . Slow transit constipation   . Lethargy   . IVH (intraventricular hemorrhage) (HCC) 09/26/2016  . B12 deficiency 09/26/2016  . Hypertensive emergency 09/26/2016  . Essential hypertension 09/26/2016  . Carotid aneurysm, left (HCC) 09/26/2016  . Open thigh wound 09/26/2016  . Somnolence   . Recurrent major depressive disorder (HCC)   . ETOH abuse   . Tobacco abuse   . Cocaine abuse (HCC)   . Pure hypercholesterolemia   . 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

## 2018-02-06 ENCOUNTER — Ambulatory Visit: Payer: Medicaid Other | Admitting: Physical Therapy

## 2018-02-06 DIAGNOSIS — M6281 Muscle weakness (generalized): Secondary | ICD-10-CM

## 2018-02-06 DIAGNOSIS — R600 Localized edema: Secondary | ICD-10-CM

## 2018-02-06 DIAGNOSIS — G8929 Other chronic pain: Secondary | ICD-10-CM

## 2018-02-06 DIAGNOSIS — M25561 Pain in right knee: Secondary | ICD-10-CM

## 2018-02-06 DIAGNOSIS — R2681 Unsteadiness on feet: Secondary | ICD-10-CM

## 2018-02-06 DIAGNOSIS — M25661 Stiffness of right knee, not elsewhere classified: Secondary | ICD-10-CM

## 2018-02-06 NOTE — Patient Instructions (Addendum)
WALKING  Walking is a great form of exercise to increase your strength, endurance and overall fitness.  A walking program can help you start slowly and gradually build endurance as you go.  Everyone's ability is different, so each person's starting point will be different.  You do not have to follow them exactly.  The are just samples. You should simply find out what's right for you and stick to that program.   In the beginning, you'll start off walking 2-3 times a day for short distances.  As you get stronger, you'll be walking further at just 1-2 times per day.  A. You Can Walk For A Certain Length Of Time Each Day    Walk 5 minutes 3 times per day.  Increase 2 minutes every 2 days (3 times per day).  Work up to 25-30 minutes (1-2 times per day).   Example:   Day 1-2 5 minutes 3 times per day   Day 7-8 12 minutes 2-3 times per day   Day 13-14 25 minutes 1-2 times per day  B. You Can Walk For a Certain Distance Each Day     Distance can be substituted for time.  At your track it is probably a 1/4 mile around 1 time  Or 14 laps is a mile    Example:   3 trips to mailbox (at road)   3 trips to corner of block   3 trips around the block  C. Go to local high school and use the track.    Walk for distance ____ around track  14 laps is a mile  Or time ____ minutes  D. Walk ___x_ Jog ____ Run ___  Please only do the exercises that your therapist has initialed and dated  Trigger Point Dry Needling  . What is Trigger Point Dry Needling (DN)? o DN is a physical therapy technique used to treat muscle pain and dysfunction. Specifically, DN helps deactivate muscle trigger points (muscle knots).  o A thin filiform needle is used to penetrate the skin and stimulate the underlying trigger point. The goal is for a local twitch response (LTR) to occur and for the trigger point to relax. No medication of any kind is injected during the procedure.   . What Does Trigger Point Dry Needling Feel  Like?  o The procedure feels different for each individual patient. Some patients report that they do not actually feel the needle enter the skin and overall the process is not painful. Very mild bleeding may occur. However, many patients feel a deep cramping in the muscle in which the needle was inserted. This is the local twitch response.   Marland Kitchen How Will I feel after the treatment? o Soreness is normal, and the onset of soreness may not occur for a few hours. Typically this soreness does not last longer than two days.  o Bruising is uncommon, however; ice can be used to decrease any possible bruising.  o In rare cases feeling tired or nauseous after the treatment is normal. In addition, your symptoms may get worse before they get better, this period will typically not last longer than 24 hours.   . What Can I do After My Treatment? o Increase your hydration by drinking more water for the next 24 hours. o You may place ice or heat on the areas treated that have become sore, however, do not use heat on inflamed or bruised areas. Heat often brings more relief post needling. o You can continue  your regular activities, but vigorous activity is not recommended initially after the treatment for 24 hours. o DN is best combined with other physical therapy such as strengthening, stretching, and other therapies.   Garen Lah, PT Certified Exercise Expert for the Aging Adult  02/06/18 8:11 AM Phone: [phone] Fax: [phone]

## 2018-02-06 NOTE — Therapy (Signed)
[facility] [address] [place], [place], [address] Phone: [phone]   Fax:  [phone]  Physical Therapy Treatment  Patient Details  Name: Kelsey Smith MRN: [id number] Date of Birth: [birth year] Referring Provider: Gershon Mussel MD   Encounter Date: 02/06/2018  PT End of Session - 02/06/18 0804    Visit Number  6    Number of Visits  16 2 of 12 visits authorized    Authorization Type  MCD- Reauthorized     Authorization Time Period  02/03/2018-03/16/2018    Authorization - Visit Number  2    Authorization - Number of Visits  12    PT Start Time  0803    PT Stop Time  0844    PT Time Calculation (min)  41 min    Activity Tolerance  Patient tolerated treatment well    Behavior During Therapy  [facility] for tasks assessed/performed       Past Medical History:  Diagnosis Date  . Alcoholism (HCC)   . Aneurysm, cerebral   . Anxiety   . Arthritis   . Depression   . HLD (hyperlipidemia)   . Hypertension   . Stroke [facility])     Past Surgical History:  Procedure Laterality Date  . ELBOW FRACTURE SURGERY Left   . GYNECOLOGIC CRYOSURGERY      There were no vitals filed for this visit.  Subjective Assessment - 02/06/18 0805    Subjective  I was sore after last visit but I am better today    Pertinent History  IVH (l thalamic) CVA with residual right hemiparesis,( jan 2018   carpal tunnell L >R, De quervains tenosynovitis , aneurysm hasnt changed for a year    Limitations  Walking;Standing;House hold activities    How long can you sit comfortably?  unlimited    How long can you stand comfortably?  30 to 45 minutes    How long can you walk comfortably?  20 minutes at one time    Diagnostic tests  MRA    Patient Stated Goals  I want to strengthen my leg and get rid of my limp in my right leg. 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      Currently in Pain?  Yes    Pain Score  5     Pain Location   Knee    Pain Orientation  Right    Pain Descriptors / Indicators  Aching;Sore    Pain Type  Chronic pain    Pain Onset  More than a month ago                       [facility] Adult PT Treatment/Exercise - 02/06/18 0816      Self-Care   Self-Care  Other Self-Care Comments    Other Self-Care Comments   education on walking program and RPE and trigger point dry needling      Knee/Hip Exercises: Stretches   Gastroc Stretch  3 reps;30 seconds;Right;Left      Knee/Hip Exercises: Aerobic   Elliptical  Level 1 for 5 minutes with UE alson      Knee/Hip Exercises: Standing   Lateral Step Up  Right;Left;15 reps;Hand Hold: 2;2 sets;Step Height: 6"    Forward Step Up  Right;Left;2 sets;Hand Hold: 2;Step Height: 6";15 reps    Other Standing Knee Exercises  Gastroc stretch and heel raise x 20 on step with bil UE  handhold      Knee/Hip Exercises: Seated   Hamstring Curl  10 reps;Strengthening;Right;Left;2 sets;Weights     Hamstring Weights  3 lbs.    Sit to Sand  15 reps without UE support      Knee/Hip Exercises: Supine   Bridges  20 reps    Bridges with [employer]  20 reps    Straight Leg Raises  20 reps;Both 3 lb      Modalities   Modalities  Moist Heat      Moist Heat Therapy   Number Minutes Moist Heat  15 Minutes    Moist Heat Location  Knee left      Manual Therapy   Manual Therapy  Soft tissue mobilization    Manual therapy comments  skilled palpation with TPDN    Soft tissue mobilization  medial right knee and medial hamstring distal end        Trigger Point Dry Needling - 02/06/18 0844    Consent Given?  Yes    Education Handout Provided  Yes    Muscles Treated Lower Body  Hamstring;Quadriceps right knee    Quadriceps Response  Twitch response elicited;Palpable increased muscle length    Hamstring Response  Palpable increased muscle length           PT Education - 02/06/18 0808    Education provided  Yes    Education Details  reviewed HEP and  educated on home walking program    Person(s) Educated  Patient    Methods  Explanation;Demonstration    Comprehension  Verbalized understanding;Returned demonstration       PT Short Term Goals - 01/23/18 0812      PT SHORT TERM GOAL #1   Title  Pt will be independent with inital HEP    Baseline  Pt returned demo of initial HEP independently    Time  4    Period  Weeks    Status  Achieved      PT SHORT TERM GOAL #2   Title  Pt will do BERG balance test and set LTG     Baseline  Pt with 56/56 BERG  no need for assistive devise or balance issue    Time  4    Period  Weeks    Status  Achieved      PT SHORT TERM GOAL #3   Title  Pt will be able to negotiate steps with with step over step technique and minimal UE support    Baseline  Pt able to negotiate steps with minimal use of UE and alternating steps x 8 steps    Time  4    Period  Weeks    Status  Achieved      PT SHORT TERM GOAL #4   Title  flexion in right knee to improve to 145 degree flexion in order to improve quality of gait with normal stride length     Baseline  Pt right knee 147 flexion 01-23-18    Time  4    Period  Weeks    Status  Achieved        PT Long Term Goals - 02/06/18 4098      PT LONG TERM GOAL #1   Title  Pt will be independent with advanced HEP    Baseline  reinforcing and progressing    Time  6    Period  Weeks    Status  On-going      PT LONG TERM  GOAL #2   Title  gross LE strength to at least 4+/5  to improve gait and functional mobility    Baseline  Pt walking with minimal limp only when fatigued     Time  6    Period  Weeks    Status  On-going      PT LONG TERM GOAL #3   Title  Pt will be able to perform 15 squats from chair to show improved better motor controll with sit to stand without need for UE support    Baseline  Pt accomplished to day wihtout UE support    Time  6    Period  Weeks    Status  Achieved      PT LONG TERM GOAL #4   Title  Pt will be able to ambulate around  shops/grocery stores for 1 hour before taking a break due to fatigue    Baseline  Pt is able to walk for about 20 minutes but fatigues at present time.  Giiven progressive walking program    Time  6    Period  Weeks    Status  On-going      PT LONG TERM GOAL #5   Title  Average pain with ADL's </ = 3/10    Baseline  Pain about 5/10 getting in and out of tub.    Time  6    Period  Weeks    Status  On-going            Plan - 02/06/18 0845    Clinical Impression Statement  Pt enters clinic with minimal limping on right LE.  Pt able to squat x 15 without UE support and was able to achieve goal LTG 4.  Pt is making progress toward other goals.  Pt consented to [facility]  for scar tissue and pain releif. Pt was closely monitored through out session.   Pt was initially sore but with DOMS but progressing with longer walk periods.  Pt educated on walking program and has a track to use   14 laps is one mile and encouraged to pursue 1 mile as a goal and try to increase laps as able    Rehab Potential  Good    PT Frequency  2x / week    PT Duration  6 weeks    PT Treatment/Interventions  ADLs/Self Care Home Management;Cryotherapy;Electrical Stimulation;Iontophoresis /ml Dexamethasone;Moist Heat;Ultrasound;Therapeutic exercise;Therapeutic activities;Functional mobility training;Gait training;Stair training;Neuromuscular re-education;Patient/family education;Scar mobilization;Manual techniques;Passive range of motion;Dry needling;Taping;Vasopneumatic Device    PT Next Visit Plan  reciprocal stairs, closed chain check TPDN and walking program  add lunges at counter    PT Home Exercise Plan  knee step ups and lateral step ups, SLR with ER, quad set . gastroc stretch, TKE,steps gastroc stretch bil heel raise    Consulted and Agree with Plan of Care  Patient       Patient will benefit from skilled therapeutic intervention in order to improve the following deficits and impairments:  Abnormal gait,  Decreased balance, Decreased range of motion, Decreased mobility, Decreased scar mobility, Pain, Increased fascial restricitons, Difficulty walking, Decreased strength  Visit Diagnosis: Muscle weakness (generalized)  Chronic pain of right knee  Unsteadiness on feet  Localized edema  Stiffness of right knee, not elsewhere classified     Problem List Patient Active Problem List   Diagnosis Date Noted  . Paresthesia 11/29/2017  . Hyperlipidemia 10/21/2017  . Overactive bladder 08/30/2017  . Trigger finger, acquired 08/21/2017  .  De Quervain's tenosynovitis, right 04/16/2017  . Carpal tunnel syndrome 02/28/2017  . Insomnia 02/28/2017  . Anxiety and depression 02/28/2017  . Benign essential HTN   . Right hemiparesis (HCC)   . Slow transit constipation   . Lethargy   . IVH (intraventricular hemorrhage) (HCC) 09/26/2016  . B12 deficiency 09/26/2016  . Hypertensive emergency 09/26/2016  . Essential hypertension 09/26/2016  . Carotid aneurysm, left (HCC) 09/26/2016  . Open thigh wound 09/26/2016  . Somnolence   . Recurrent major depressive disorder (HCC)   . ETOH abuse   . Tobacco abuse   . Cocaine abuse (HCC)   . Pure hypercholesterolemia   . 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 [place], [place], [address] Phone: [phone]   Fax:  [phone]  Name: Kelsey Smith MRN: [id number] Date of Birth: 1960-10-05

## 2018-02-10 ENCOUNTER — Ambulatory Visit: Payer: Medicaid Other | Admitting: Physical Therapy

## 2018-02-10 ENCOUNTER — Encounter: Payer: Self-pay | Admitting: Physical Therapy

## 2018-02-10 DIAGNOSIS — R2681 Unsteadiness on feet: Secondary | ICD-10-CM

## 2018-02-10 DIAGNOSIS — R600 Localized edema: Secondary | ICD-10-CM

## 2018-02-10 DIAGNOSIS — M6281 Muscle weakness (generalized): Secondary | ICD-10-CM

## 2018-02-10 DIAGNOSIS — M25561 Pain in right knee: Secondary | ICD-10-CM

## 2018-02-10 DIAGNOSIS — G8929 Other chronic pain: Secondary | ICD-10-CM

## 2018-02-10 DIAGNOSIS — M25661 Stiffness of right knee, not elsewhere classified: Secondary | ICD-10-CM

## 2018-02-10 NOTE — Therapy (Signed)
[facility] [address] [place], [place], [address] Phone: [phone]   Fax:  [phone]  Physical Therapy Treatment  Patient Details  Name: Kelsey Smith MRN: [id number] Date of Birth: 02-16-1961 Referring Provider: Gershon Mussel MD   Encounter Date: 02/10/2018  PT End of Session - 02/10/18 0805    Visit Number  7    Number of Visits  16    Date for PT Re-Evaluation  03/06/18    Authorization Type  MCD- Reauthorized     Authorization Time Period  02/03/2018-03/16/2018    Authorization - Visit Number  3    Authorization - Number of Visits  12    PT Start Time  0800    PT Stop Time  0838    PT Time Calculation (min)  38 min       Past Medical History:  Diagnosis Date  . Alcoholism (HCC)   . Aneurysm, cerebral   . Anxiety   . Arthritis   . Depression   . HLD (hyperlipidemia)   . Hypertension   . Stroke [facility])     Past Surgical History:  Procedure Laterality Date  . ELBOW FRACTURE SURGERY Left   . GYNECOLOGIC CRYOSURGERY      There were no vitals filed for this visit.  Subjective Assessment - 02/10/18 0803    Subjective  I was sore after TPDN. Not really in pain today. Moving today.     Currently in Pain?  No/denies                       [facility] Adult PT Treatment/Exercise - 02/10/18 0001      Knee/Hip Exercises: Stretches   Active Hamstring Stretch  3 reps;30 seconds    Quad Stretch  60 seconds      Knee/Hip Exercises: Aerobic   Nustep  L5 x 5 min LE only       Knee/Hip Exercises: Standing   Heel Raises  20 reps    Lateral Step Up  Right;Left;15 reps;Hand Hold: 2;2 sets;Step Height: 6"    Forward Step Up  Right;Left;2 sets;Hand Hold: 2;Step Height: 6";15 reps    Other Standing Knee Exercises  slant board stretch    Other Standing Knee Exercises  lunge at counter 10 x 2 , small ROM, cues for technique       Knee/Hip Exercises: Seated   Hamstring Curl  10  reps;Strengthening;Right;Left;2 sets;Weights     Hamstring Weights  3 lbs.    Sit to Sand  15 reps without UE support      Knee/Hip Exercises: Supine   Bridges  20 reps    Bridges with [employer]  20 reps    Straight Leg Raises  20 reps;Both 3 lb      Knee/Hip Exercises: Sidelying   Hip ABduction  20 reps;Both 3#       Knee/Hip Exercises: Prone   Hamstring Curl  20 reps 3#    Hip Extension  20 reps 3#               PT Short Term Goals - 01/23/18 6962      PT SHORT TERM GOAL #1   Title  Pt will be independent with inital HEP    Baseline  Pt returned demo of initial HEP independently    Time  4    Period  Weeks    Status  Achieved      PT SHORT TERM GOAL #  2   Title  Pt will do BERG balance test and set LTG     Baseline  Pt with 56/56 BERG  no need for assistive devise or balance issue    Time  4    Period  Weeks    Status  Achieved      PT SHORT TERM GOAL #3   Title  Pt will be able to negotiate steps with with step over step technique and minimal UE support    Baseline  Pt able to negotiate steps with minimal use of UE and alternating steps x 8 steps    Time  4    Period  Weeks    Status  Achieved      PT SHORT TERM GOAL #4   Title  flexion in right knee to improve to 145 degree flexion in order to improve quality of gait with normal stride length     Baseline  Pt right knee 147 flexion 01-23-18    Time  4    Period  Weeks    Status  Achieved        PT Long Term Goals - 02/06/18 8295      PT LONG TERM GOAL #1   Title  Pt will be independent with advanced HEP    Baseline  reinforcing and progressing    Time  6    Period  Weeks    Status  On-going      PT LONG TERM GOAL #2   Title  gross LE strength to at least 4+/5  to improve gait and functional mobility    Baseline  Pt walking with minimal limp only when fatigued     Time  6    Period  Weeks    Status  On-going      PT LONG TERM GOAL #3   Title  Pt will be able to perform 15 squats from  chair to show improved better motor controll with sit to stand without need for UE support    Baseline  Pt accomplished to day wihtout UE support    Time  6    Period  Weeks    Status  Achieved      PT LONG TERM GOAL #4   Title  Pt will be able to ambulate around shops/grocery stores for 1 hour before taking a break due to fatigue    Baseline  Pt is able to walk for about 20 minutes but fatigues at present time.  Giiven progressive walking program    Time  6    Period  Weeks    Status  On-going      PT LONG TERM GOAL #5   Title  Average pain with ADL's </ = 3/10    Baseline  Pain about 5/10 getting in and out of tub.    Time  6    Period  Weeks    Status  On-going            Plan - 02/10/18 6213    Clinical Impression Statement  Pt arrives with no pain. She reports TPDN helpful after initial soreness wore off. We added standing lunge at counter per poc. She required cues for proper form so did not add to HEP today. She has not had a chance to start walking and is moving today. She plans to start walking program after she finishes her move to new house.     PT Next Visit Plan  reciprocal stairs,  closed chain check TPDN and walking program  repeat lunges at counter    PT Home Exercise Plan  knee step ups and lateral step ups, SLR with ER, quad set . gastroc stretch, TKE,steps gastroc stretch bil heel raise    Consulted and Agree with Plan of Care  Patient       Patient will benefit from skilled therapeutic intervention in order to improve the following deficits and impairments:  Abnormal gait, Decreased balance, Decreased range of motion, Decreased mobility, Decreased scar mobility, Pain, Increased fascial restricitons, Difficulty walking, Decreased strength  Visit Diagnosis: Muscle weakness (generalized)  Chronic pain of right knee  Unsteadiness on feet  Localized edema  Stiffness of right knee, not elsewhere classified     Problem List Patient Active Problem List    Diagnosis Date Noted  . Paresthesia 11/29/2017  . Hyperlipidemia 10/21/2017  . Overactive bladder 08/30/2017  . Trigger finger, acquired 08/21/2017  . De Quervain's tenosynovitis, right 04/16/2017  . Carpal tunnel syndrome 02/28/2017  . Insomnia 02/28/2017  . Anxiety and depression 02/28/2017  . Benign essential HTN   . Right hemiparesis (HCC)   . Slow transit constipation   . Lethargy   . IVH (intraventricular hemorrhage) (HCC) 09/26/2016  . B12 deficiency 09/26/2016  . Hypertensive emergency 09/26/2016  . Essential hypertension 09/26/2016  . Carotid aneurysm, left (HCC) 09/26/2016  . Open thigh wound 09/26/2016  . Somnolence   . Recurrent major depressive disorder (HCC)   . ETOH abuse   . Tobacco abuse   . Cocaine abuse (HCC)   . Pure hypercholesterolemia   . 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

## 2018-02-13 ENCOUNTER — Ambulatory Visit: Payer: Medicaid Other | Admitting: Physical Therapy

## 2018-02-13 DIAGNOSIS — M25561 Pain in right knee: Secondary | ICD-10-CM

## 2018-02-13 DIAGNOSIS — M6281 Muscle weakness (generalized): Secondary | ICD-10-CM | POA: Diagnosis not present

## 2018-02-13 DIAGNOSIS — M25661 Stiffness of right knee, not elsewhere classified: Secondary | ICD-10-CM

## 2018-02-13 DIAGNOSIS — R600 Localized edema: Secondary | ICD-10-CM

## 2018-02-13 DIAGNOSIS — G8929 Other chronic pain: Secondary | ICD-10-CM

## 2018-02-13 DIAGNOSIS — R2681 Unsteadiness on feet: Secondary | ICD-10-CM

## 2018-02-13 NOTE — Patient Instructions (Signed)
   Garen Lah, PT Certified Exercise Expert for the Aging Adult  02/13/18 8:24 AM Phone: [phone] Fax: [phone]

## 2018-02-13 NOTE — Therapy (Signed)
[facility] [address] [place], [place], [address] Phone: [phone]   Fax:  [phone]  Physical Therapy Treatment  Patient Details  Name: Kelsey Smith MRN: [id number] Date of Birth: 07-06-1961 Referring Provider: Gershon Mussel MD   Encounter Date: 02/13/2018  PT End of Session - 02/13/18 0810    Visit Number  8    Number of Visits  16    Date for PT Re-Evaluation  03/06/18    Authorization Type  MCD- Reauthorized     Authorization Time Period  02/03/2018-03/16/2018    Authorization - Visit Number  8    Authorization - Number of Visits  16 end on 03-16-18    PT Start Time  0800    PT Stop Time  0844    PT Time Calculation (min)  44 min    Activity Tolerance  Patient tolerated treatment well    Behavior During Therapy  [facility] for tasks assessed/performed       Past Medical History:  Diagnosis Date  . Alcoholism (HCC)   . Aneurysm, cerebral   . Anxiety   . Arthritis   . Depression   . HLD (hyperlipidemia)   . Hypertension   . Stroke [facility])     Past Surgical History:  Procedure Laterality Date  . ELBOW FRACTURE SURGERY Left   . GYNECOLOGIC CRYOSURGERY      There were no vitals filed for this visit.  Subjective Assessment - 02/13/18 0808    Subjective  I was sore after TPDN but it helped. I have been trying to walk up and down the street but havent been to track yet    Pertinent History  IVH (l thalamic) CVA with residual right hemiparesis,( jan 2018   carpal tunnell L >R, De quervains tenosynovitis , aneurysm hasnt changed for a year    Limitations  Walking;Standing;House hold activities    Patient Stated Goals  I want to strengthen my leg and get rid of my limp in my right leg. 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      Currently in Pain?  Yes    Pain Score  5     Pain Location  Knee    Pain Orientation  Right    Pain Descriptors / Indicators  Sore    Pain Type  Chronic pain     Pain Onset  More than a month ago                       [facility] Adult PT Treatment/Exercise - 02/13/18 1324      Knee/Hip Exercises: Aerobic   Elliptical  only tolerated 3 minutes at level 1      Knee/Hip Exercises: Standing   Heel Raises  20 reps;2 sets;Both    Lateral Step Up  Right;Left;15 reps;Hand Hold: 2;2 sets;Step Height: 6"    Forward Step Up  Right;Left;2 sets;Hand Hold: 2;Step Height: 6";15 reps    Step Down  Right;2 sets;Hand Hold: 2 VC for DF or left ankle , R SLS only    Other Standing Knee Exercises  standing hip  extension 15 x with 5 lb on R, 15 x with Ham curl R with 5 lb    Other Standing Knee Exercises  lunge at the counter      Knee/Hip Exercises: Supine   Straight Leg Raises  20 reps;Both 5 lb      Knee/Hip Exercises: Sidelying  Hip ABduction  20 reps;Both 5#      Moist Heat Therapy   Number Minutes Moist Heat  --    Moist Heat Location  --      Manual Therapy   Manual Therapy  Soft tissue mobilization    Manual therapy comments  skilled palpation with TPDN    Soft tissue mobilization  medial right knee and medial hamstring distal end        Trigger Point Dry Needling - 02/13/18 0827    Consent Given?  Yes    Education Handout Provided  No previously given    Muscles Treated Lower Body  Hamstring;Quadriceps right knee only    Quadriceps Response  Twitch response elicited;Palpable increased muscle length scar tissue    Hamstring Response  Twitch response elicited;Palpable increased muscle length medial hams           PT Education - 02/13/18 0824    Education provided  Yes    Education Details  reinforced HEP and added lunge at [employer]) Educated  Patient    Methods  Explanation;Demonstration;Tactile cues;Verbal cues;Handout    Comprehension  Verbalized understanding;Returned demonstration       PT Short Term Goals - 01/23/18 1610      PT SHORT TERM GOAL #1   Title  Pt will be independent with inital HEP     Baseline  Pt returned demo of initial HEP independently    Time  4    Period  Weeks    Status  Achieved      PT SHORT TERM GOAL #2   Title  Pt will do BERG balance test and set LTG     Baseline  Pt with 56/56 BERG  no need for assistive devise or balance issue    Time  4    Period  Weeks    Status  Achieved      PT SHORT TERM GOAL #3   Title  Pt will be able to negotiate steps with with step over step technique and minimal UE support    Baseline  Pt able to negotiate steps with minimal use of UE and alternating steps x 8 steps    Time  4    Period  Weeks    Status  Achieved      PT SHORT TERM GOAL #4   Title  flexion in right knee to improve to 145 degree flexion in order to improve quality of gait with normal stride length     Baseline  Pt right knee 147 flexion 01-23-18    Time  4    Period  Weeks    Status  Achieved        PT Long Term Goals - 02/13/18 0830      PT LONG TERM GOAL #1   Title  Pt will be independent with advanced HEP    Baseline  added lunge at counter today to HEP    Time  6    Period  Weeks    Status  On-going      PT LONG TERM GOAL #2   Title  gross LE strength to at least 4+/5  to improve gait and functional mobility    Baseline  Pt walking with more significant limp today and fatiguing earlier    Time  6    Period  Weeks    Status  On-going      PT LONG TERM GOAL #3   Title  Pt will be able to perform 15 squats from chair to show improved better motor controll with sit to stand without need for UE support    Baseline  Pt accomplished 02-06-18  without UE support    Time  6    Period  Weeks    Status  Achieved      PT LONG TERM GOAL #4   Title  Pt will be able to ambulate around shops/grocery stores for 1 hour before taking a break due to fatigue    Baseline  Pt is able to walk for about 20 minutes but fatigues at present time.  has begun walking program but not around track    Time  6    Period  Weeks    Status  On-going      PT LONG TERM  GOAL #5   Title  Average pain with ADL's </ = 3/10    Baseline  Pain about 5/10 getting in and out of tub.    Time  6    Period  Weeks    Status  On-going            Plan - 02/13/18 1610    Clinical Impression Statement  Pt declined moist heat after TPDN.  Pt is now starting walking program. states pain in right knee is 5/10 but she feels TPDN ismaking her scar tissue less sensitive to touch.  Pt  consents to  TPDN and is closely monitored throughout session.  Ms. Hataway used 5lb wt and added lungining at counter for exercises today to HEP. Pt fatigued after only 3 minutes on the elliptical  Pt is progressing toward LTG's      Rehab Potential  Good    PT Frequency  2x / week    PT Duration  6 weeks    PT Treatment/Interventions  ADLs/Self Care Home Management;Cryotherapy;Electrical Stimulation;Iontophoresis /ml Dexamethasone;Moist Heat;Ultrasound;Therapeutic exercise;Therapeutic activities;Functional mobility training;Gait training;Stair training;Neuromuscular re-education;Patient/family education;Scar mobilization;Manual techniques;Passive range of motion;Dry needling;Taping;Vasopneumatic Device    PT Next Visit Plan  reciprocal stairs, closed chain check TPDN and walking program  build endurance on the elliptical.   Proprioception SLS clock    PT Home Exercise Plan  knee step ups and lateral step ups, SLR with ER, quad set . gastroc stretch, TKE,steps gastroc stretch bil heel raise, lunges at counter    Consulted and Agree with Plan of Care  Patient       Patient will benefit from skilled therapeutic intervention in order to improve the following deficits and impairments:  Abnormal gait, Decreased balance, Decreased range of motion, Decreased mobility, Decreased scar mobility, Pain, Increased fascial restricitons, Difficulty walking, Decreased strength  Visit Diagnosis: Muscle weakness (generalized)  Chronic pain of right knee  Unsteadiness on feet  Localized  edema  Stiffness of right knee, not elsewhere classified     Problem List Patient Active Problem List   Diagnosis Date Noted  . Paresthesia 11/29/2017  . Hyperlipidemia 10/21/2017  . Overactive bladder 08/30/2017  . Trigger finger, acquired 08/21/2017  . De Quervain's tenosynovitis, right 04/16/2017  . Carpal tunnel syndrome 02/28/2017  . Insomnia 02/28/2017  . Anxiety and depression 02/28/2017  . Benign essential HTN   . Right hemiparesis (HCC)   . Slow transit constipation   . Lethargy   . IVH (intraventricular hemorrhage) (HCC) 09/26/2016  . B12 deficiency 09/26/2016  . Hypertensive emergency 09/26/2016  . Essential hypertension 09/26/2016  . Carotid aneurysm, left (HCC) 09/26/2016  . Open thigh wound 09/26/2016  .  Somnolence   . Recurrent major depressive disorder (HCC)   . ETOH abuse   . Tobacco abuse   . Cocaine abuse (HCC)   . Pure hypercholesterolemia   . 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

## 2018-02-18 ENCOUNTER — Ambulatory Visit: Payer: Medicaid Other | Admitting: Physical Therapy

## 2018-02-20 ENCOUNTER — Ambulatory Visit: Payer: Medicaid Other | Admitting: Physical Therapy

## 2018-02-24 ENCOUNTER — Ambulatory Visit: Payer: Medicaid Other | Admitting: Physical Therapy

## 2018-02-26 ENCOUNTER — Telehealth: Payer: Self-pay | Admitting: Physical Therapy

## 2018-02-26 ENCOUNTER — Ambulatory Visit: Payer: Medicaid Other | Attending: Family Medicine | Admitting: Physical Therapy

## 2018-02-26 DIAGNOSIS — M25561 Pain in right knee: Secondary | ICD-10-CM | POA: Insufficient documentation

## 2018-02-26 DIAGNOSIS — R600 Localized edema: Secondary | ICD-10-CM | POA: Insufficient documentation

## 2018-02-26 DIAGNOSIS — R2681 Unsteadiness on feet: Secondary | ICD-10-CM | POA: Insufficient documentation

## 2018-02-26 DIAGNOSIS — M6281 Muscle weakness (generalized): Secondary | ICD-10-CM | POA: Insufficient documentation

## 2018-02-26 DIAGNOSIS — G8929 Other chronic pain: Secondary | ICD-10-CM | POA: Insufficient documentation

## 2018-02-26 DIAGNOSIS — M25661 Stiffness of right knee, not elsewhere classified: Secondary | ICD-10-CM | POA: Insufficient documentation

## 2018-02-26 NOTE — Telephone Encounter (Signed)
Spoke to patient regarding missed appointment this morning. She thought her appointment was later this morning. We rescheduled her to another date.

## 2018-02-27 ENCOUNTER — Ambulatory Visit: Payer: Medicaid Other | Admitting: Physical Therapy

## 2018-02-27 ENCOUNTER — Encounter: Payer: Self-pay | Admitting: Physical Therapy

## 2018-02-27 DIAGNOSIS — M6281 Muscle weakness (generalized): Secondary | ICD-10-CM | POA: Diagnosis present

## 2018-02-27 DIAGNOSIS — M25561 Pain in right knee: Secondary | ICD-10-CM | POA: Diagnosis present

## 2018-02-27 DIAGNOSIS — M25661 Stiffness of right knee, not elsewhere classified: Secondary | ICD-10-CM

## 2018-02-27 DIAGNOSIS — R2681 Unsteadiness on feet: Secondary | ICD-10-CM | POA: Diagnosis present

## 2018-02-27 DIAGNOSIS — G8929 Other chronic pain: Secondary | ICD-10-CM | POA: Diagnosis present

## 2018-02-27 DIAGNOSIS — R600 Localized edema: Secondary | ICD-10-CM

## 2018-02-27 NOTE — Therapy (Signed)
[facility] [address] [place], [place], [address] Phone: [phone]   Fax:  [phone]  Physical Therapy Treatment  Patient Details  Name: Kelsey Smith MRN: [id number] Date of Birth: Jul 31, 1961 Referring Provider: Gershon MusselXu, Naiping MD   Encounter Date: 02/27/2018  PT End of Session - 02/27/18 0806    Visit Number  9    Number of Visits  16    Date for PT Re-Evaluation  03/06/18    Authorization - Visit Number  9    Authorization - Number of Visits  16    PT Start Time  0730    PT Stop Time  0801    PT Time Calculation (min)  31 min    Activity Tolerance  Patient tolerated treatment well    Behavior During Therapy  [facility] for [facility] assessed/performed       Past Medical History:  Diagnosis Date  . Alcoholism (HCC)   . Aneurysm, cerebral   . Anxiety   . Arthritis   . Depression   . HLD (hyperlipidemia)   . Hypertension   . Stroke [facility](HCC)     Past Surgical History:  Procedure Laterality Date  . ELBOW FRACTURE SURGERY Left   . GYNECOLOGIC CRYOSURGERY      There were no vitals filed for this visit.  Subjective Assessment - 02/27/18 0743    Subjective  dOING THE FLOOR.  i HAVE  RUBBING IT TO SOFTEN IT.  Exercises help.    Currently in Pain?  Yes    Pain Score  4     Pain Location  Knee    Pain Orientation  Right    Pain Descriptors / Indicators  Sore    Pain Frequency  Constant    Aggravating Factors   IN OUT TUN LAYING ON LEG    Pain Relieving Factors  rubbing dry needle,  stretching         [facility] PT Assessment - 02/27/18 0001      Observation/Other Assessments-Edema    Edema  -- 16 1/4 inch girth  knee joint line                   [facility] Adult PT Treatment/Exercise - 02/27/18 0001      Ambulation/Gait   Ambulation/Gait  Yes    Gait Comments  step over step,  on level to increase trunk rotation cues      High Level Balance   High Level Balance Comments  single leg balance ex,  static and  dynamic,  some soreness noted medial knee,  close SBA      Self-Care   Other Self-Care Comments   tub transfer simulation,  in /out of car. how to reduce edema      Knee/Hip Exercises: Stretches   Passive Hamstring Stretch  3 reps;30 seconds      Manual Therapy   Manual therapy comments  retrograde soft tissue work medial hamstrings,  able to visably reduce edema leg..             PT Education - 02/27/18 0806    Education provided  Yes    Education Details  self care,  gait    Person(s) Educated  Patient    Methods  Explanation;Demonstration;Verbal cues    Comprehension  Verbalized understanding;Returned demonstration       PT Short Term Goals - 01/23/18 0812      PT SHORT TERM GOAL #1   Title  Pt will be  independent with inital HEP    Baseline  Pt returned demo of initial HEP independently    Time  4    Period  Weeks    Status  Achieved      PT SHORT TERM GOAL #2   Title  Pt will do BERG balance test and set LTG     Baseline  Pt with 56/56 BERG  no need for assistive devise or balance issue    Time  4    Period  Weeks    Status  Achieved      PT SHORT TERM GOAL #3   Title  Pt will be able to negotiate steps with with step over step technique and minimal UE support    Baseline  Pt able to negotiate steps with minimal use of UE and alternating steps x 8 steps    Time  4    Period  Weeks    Status  Achieved      PT SHORT TERM GOAL #4   Title  flexion in right knee to improve to 145 degree flexion in order to improve quality of gait with normal stride length     Baseline  Pt right knee 147 flexion 01-23-18    Time  4    Period  Weeks    Status  Achieved        PT Long Term Goals - 02/27/18 0809      PT LONG TERM GOAL #1   Title  Pt will be independent with advanced HEP    Baseline  independent so far    Time  6    Period  Weeks    Status  On-going      PT LONG TERM GOAL #2   Title  gross LE strength to at least 4+/5  to improve gait and functional  mobility    Baseline  no limp this morning,  self reported had one earlier am and late in the day    Time  6    Period  Weeks    Status  On-going      PT LONG TERM GOAL #3   Title  Pt will be able to perform 15 squats from chair to show improved better motor controll with sit to stand without need for UE support    Time  6    Period  Weeks    Status  Achieved      PT LONG TERM GOAL #4   Title  Pt will be able to ambulate around shops/grocery stores for 1 hour before taking a break due to fatigue    Time  6    Period  Weeks    Status  Unable to assess      PT LONG TERM GOAL #5   Title  Average pain with ADL's </ = 3/10    Baseline  5/10 in /out of tub,  SLS    Time  6    Period  Weeks    Status  On-going            Plan - 02/27/18 0807    Clinical Impression Statement  Girth joint line 16 1/4 inch much improved.  Gait showed improved trunk rotation post cues and practice,  able to ascend and descend 16 steps , step over step.  Mild soreness post session.  30 minute session, did not get time for elliptical.     PT Next Visit Plan  reciprocal stairs, closed chain check  TPDN and walking program  build endurance on the elliptical.   Proprioception SLS clock.  No visits available next week.  she needs to schedule more.     PT Home Exercise Plan  knee step ups and lateral step ups, SLR with ER, quad set . gastroc stretch, TKE,steps gastroc stretch bil heel raise, lunges at counter    Consulted and Agree with Plan of Care  Patient       Patient will benefit from skilled therapeutic intervention in order to improve the following deficits and impairments:     Visit Diagnosis: Muscle weakness (generalized)  Chronic pain of right knee  Unsteadiness on feet  Localized edema  Stiffness of right knee, not elsewhere classified     Problem List Patient Active Problem List   Diagnosis Date Noted  . Paresthesia 11/29/2017  . Hyperlipidemia 10/21/2017  . Overactive bladder  08/30/2017  . Trigger finger, acquired 08/21/2017  . De Quervain's tenosynovitis, right 04/16/2017  . Carpal tunnel syndrome 02/28/2017  . Insomnia 02/28/2017  . Anxiety and depression 02/28/2017  . Benign essential HTN   . Right hemiparesis (HCC)   . Slow transit constipation   . Lethargy   . IVH (intraventricular hemorrhage) (HCC) 09/26/2016  . B12 deficiency 09/26/2016  . Hypertensive emergency 09/26/2016  . Essential hypertension 09/26/2016  . Carotid aneurysm, left (HCC) 09/26/2016  . Open thigh wound 09/26/2016  . Somnolence   . Recurrent major depressive disorder (HCC)   . ETOH abuse   . Tobacco abuse   . Cocaine abuse (HCC)   . Pure hypercholesterolemia   . 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

## 2018-03-05 ENCOUNTER — Ambulatory Visit: Payer: Medicaid Other | Attending: Family Medicine | Admitting: Family Medicine

## 2018-03-05 ENCOUNTER — Ambulatory Visit (HOSPITAL_COMMUNITY)
Admission: RE | Admit: 2018-03-05 | Discharge: 2018-03-05 | Disposition: A | Discharge status: Home / Self Care | Payer: Medicaid Other | Source: Ambulatory Visit | Attending: Family Medicine | Admitting: Family Medicine

## 2018-03-05 ENCOUNTER — Other Ambulatory Visit: Payer: Self-pay

## 2018-03-05 ENCOUNTER — Encounter: Payer: Self-pay | Admitting: Family Medicine

## 2018-03-05 VITALS — BP 138/84 | HR 63 | Temp 97.4°F | Ht 60.0 in | Wt 145.4 lb

## 2018-03-05 DIAGNOSIS — I4581 Long QT syndrome: Secondary | ICD-10-CM | POA: Insufficient documentation

## 2018-03-05 DIAGNOSIS — R9431 Abnormal electrocardiogram [ECG] [EKG]: Secondary | ICD-10-CM | POA: Insufficient documentation

## 2018-03-05 DIAGNOSIS — F419 Anxiety disorder, unspecified: Secondary | ICD-10-CM | POA: Diagnosis not present

## 2018-03-05 DIAGNOSIS — I10 Essential (primary) hypertension: Secondary | ICD-10-CM

## 2018-03-05 DIAGNOSIS — G8191 Hemiplegia, unspecified affecting right dominant side: Secondary | ICD-10-CM | POA: Diagnosis not present

## 2018-03-05 DIAGNOSIS — I619 Nontraumatic intracerebral hemorrhage, unspecified: Secondary | ICD-10-CM

## 2018-03-05 DIAGNOSIS — M25561 Pain in right knee: Secondary | ICD-10-CM | POA: Insufficient documentation

## 2018-03-05 DIAGNOSIS — F329 Major depressive disorder, single episode, unspecified: Secondary | ICD-10-CM | POA: Diagnosis not present

## 2018-03-05 DIAGNOSIS — G4700 Insomnia, unspecified: Secondary | ICD-10-CM | POA: Insufficient documentation

## 2018-03-05 DIAGNOSIS — F32A Depression, unspecified: Secondary | ICD-10-CM

## 2018-03-05 DIAGNOSIS — Z79899 Other long term (current) drug therapy: Secondary | ICD-10-CM | POA: Diagnosis not present

## 2018-03-05 DIAGNOSIS — R531 Weakness: Secondary | ICD-10-CM | POA: Insufficient documentation

## 2018-03-05 DIAGNOSIS — E785 Hyperlipidemia, unspecified: Secondary | ICD-10-CM | POA: Insufficient documentation

## 2018-03-05 DIAGNOSIS — Z8673 Personal history of transient ischemic attack (TIA), and cerebral infarction without residual deficits: Secondary | ICD-10-CM | POA: Diagnosis not present

## 2018-03-05 DIAGNOSIS — M654 Radial styloid tenosynovitis [de Quervain]: Secondary | ICD-10-CM | POA: Diagnosis not present

## 2018-03-05 DIAGNOSIS — I69251 Hemiplegia and hemiparesis following other nontraumatic intracranial hemorrhage affecting right dominant side: Secondary | ICD-10-CM | POA: Insufficient documentation

## 2018-03-05 DIAGNOSIS — N3281 Overactive bladder: Secondary | ICD-10-CM | POA: Diagnosis not present

## 2018-03-05 DIAGNOSIS — F1020 Alcohol dependence, uncomplicated: Secondary | ICD-10-CM | POA: Insufficient documentation

## 2018-03-05 DIAGNOSIS — G5601 Carpal tunnel syndrome, right upper limb: Secondary | ICD-10-CM | POA: Diagnosis not present

## 2018-03-05 DIAGNOSIS — G4709 Other insomnia: Secondary | ICD-10-CM | POA: Diagnosis not present

## 2018-03-05 DIAGNOSIS — Z131 Encounter for screening for diabetes mellitus: Secondary | ICD-10-CM

## 2018-03-05 DIAGNOSIS — R202 Paresthesia of skin: Secondary | ICD-10-CM

## 2018-03-05 LAB — POCT GLYCOSYLATED HEMOGLOBIN (HGB A1C): HBA1C, POC (PREDIABETIC RANGE): 5.3 % — AB (ref 5.7–6.4)

## 2018-03-05 MED ORDER — TRAZODONE HCL 50 MG PO TABS: 50.0000 mg | ORAL_TABLET | Freq: Every evening | ORAL | 3 refills | Status: DC | PRN

## 2018-03-05 MED ORDER — OXYBUTYNIN CHLORIDE 5 MG PO TABS: 5.0000 mg | ORAL_TABLET | Freq: Two times a day (BID) | ORAL | 3 refills | Status: DC

## 2018-03-05 MED ORDER — BUSPIRONE HCL 10 MG PO TABS: 10.0000 mg | ORAL_TABLET | Freq: Two times a day (BID) | ORAL | 3 refills | Status: DC

## 2018-03-05 MED ORDER — LISINOPRIL 10 MG PO TABS: 10.0000 mg | ORAL_TABLET | Freq: Every day | ORAL | 3 refills | Status: DC

## 2018-03-05 MED ORDER — GABAPENTIN 300 MG PO CAPS: 300.0000 mg | ORAL_CAPSULE | Freq: Three times a day (TID) | ORAL | 3 refills | Status: DC

## 2018-03-05 MED ORDER — DICLOFENAC SODIUM 75 MG PO TBEC: 75.0000 mg | DELAYED_RELEASE_TABLET | Freq: Two times a day (BID) | ORAL | 3 refills | Status: DC

## 2018-03-05 MED ORDER — SERTRALINE HCL 100 MG PO TABS: 100.0000 mg | ORAL_TABLET | Freq: Every day | ORAL | 3 refills | Status: DC

## 2018-03-05 MED ORDER — ATORVASTATIN CALCIUM 10 MG PO TABS: 10.0000 mg | ORAL_TABLET | Freq: Every day | ORAL | 3 refills | Status: DC

## 2018-03-05 NOTE — Progress Notes (Signed)
Subjective:  Patient ID: Kelsey Smith, female    DOB: [birth year]  Age: 57 y.o. 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 She was recently seen by Dr Roda Shutters for right knee pain and is currently undergoing PT with improvement in her symptoms. She also sees Dr Hermelinda [facility]us of [facility] and receives right wrist injections for carpal tunnel syndrome and trigger finger injections which have been beneficial.  She is wondering if her dose of Paxil can be increased as she has been on the same dose for a while now and is 'not quite at her best' with regards to her depression and anxiety but she denies suicidal ideations and intents. She is doing well on her antihypertensive and insomnia is controlled on Trazodone. Her overactive bladder is controlled on Oxybutynin.   Past Medical History:  Diagnosis Date  . Alcoholism (HCC)   . Aneurysm, cerebral   . Anxiety   . Arthritis   . Depression   . HLD (hyperlipidemia)   . Hypertension   . Stroke [facility])     Past Surgical History:  Procedure Laterality Date  . ELBOW FRACTURE SURGERY Left   . GYNECOLOGIC CRYOSURGERY      No Known Allergies   Outpatient Medications Prior to Visit  Medication Sig Dispense Refill  . cyanocobalamin 1000 MCG tablet Take 1 tablet (1,000 mcg total) by mouth daily. 30 tablet 4  . Multiple Vitamin (MULTIVITAMIN) tablet Take 1 tablet by mouth daily.    Marland Kitchen atorvastatin (LIPITOR) 10 MG tablet Take 1 tablet (10 mg total) by mouth daily. 30 tablet 3  . busPIRone (BUSPAR) 10 MG tablet Take 1 tablet (10 mg total) by mouth 2 (two) times daily. 60 tablet 3  . diclofenac (VOLTAREN) 75 MG EC tablet Take 1 tablet (75 mg total) by mouth 2  (two) times daily with a meal. 60 tablet 3  . gabapentin (NEURONTIN) 300 MG capsule Take 1 capsule (300 mg total) by mouth 3 (three) times daily. 60 capsule 3  . lisinopril (PRINIVIL,ZESTRIL) 10 MG tablet Take 1 tablet (10 mg total) by mouth daily. 30 tablet 3  . oxybutynin (DITROPAN) 5 MG tablet Take 1 tablet (5 mg total) by mouth 2 (two) times daily. 60 tablet 3  . PARoxetine (PAXIL) 20 MG tablet Take 1 tablet (20 mg total) by mouth every morning. 30 tablet 3  . traZODone (DESYREL) 50 MG tablet Take 1 tablet (50 mg total) by mouth at bedtime as needed for sleep. 30 tablet 3   Facility-Administered Medications Prior to Visit  Medication Dose Route Frequency Provider Last Rate Last Dose  . 0.9 %  sodium chloride infusion  500 mL Intravenous Continuous Danis, Starr Lake III, MD        ROS Review of Systems  Constitutional: Negative for activity change, appetite change and fatigue.  HENT: Negative for congestion, sinus pressure and sore throat.   Eyes: Negative for visual disturbance.  Respiratory: Negative for cough, chest tightness, shortness of breath and wheezing.   Cardiovascular: Negative for chest pain and palpitations.  Gastrointestinal: Negative for abdominal distention, abdominal pain and constipation.  Endocrine: Negative for polydipsia.  Genitourinary: Negative for dysuria and frequency.  Musculoskeletal: Negative for arthralgias and back pain.  Skin: Negative for rash.  Neurological: Negative for tremors, light-headedness and numbness.  Hematological: Does not bruise/bleed easily.  Psychiatric/Behavioral: Negative for agitation and behavioral problems.    Objective:  BP 138/84   Pulse 63   Temp (!) 97.4 F (36.3 C) (Oral)   Ht 5' (1.524 m)   Wt 145 lb 6.4 oz (66 kg)   SpO2 98%   BMI 28.40 kg/m   BP/Weight 03/05/2018 12/17/2017 11/29/2017  Systolic BP 138 123 104  Diastolic BP 84 85 69  Wt. (Lbs) 145.4 146 151.2  BMI 28.4 28.51 29.53      Physical Exam    Constitutional: She is oriented to person, place, and time. She appears well-developed and well-nourished.  Cardiovascular: Normal rate, normal heart sounds and intact distal pulses.  No murmur heard. Pulmonary/Chest: Effort normal and breath sounds normal. She has no wheezes. She has no rales. She exhibits no tenderness.  Abdominal: Soft. Bowel sounds are normal. She exhibits no distension and no mass. There is no tenderness.  Musculoskeletal: Normal range of motion. She exhibits edema (slight edema of medial aspect of R knee with medial knee scar; normal ROM).  Neurological: She is alert and oriented to person, place, and time.  Skin: Skin is warm and dry.  Psychiatric: She has a normal mood and affect.    Depression screen [facility] 03/05/2018 08/30/2017 04/16/2017 02/28/2017 11/28/2016  Decreased Interest 1 1 1 1 1   Down, Depressed, Hopeless 1 1 1 1 1   PHQ - 2 Score 2 2 2 2 2   Altered sleeping 1 3 - 3 2  Tired, decreased energy 1 1 - 1 1  Change in appetite 0 0 - 0 0  Feeling bad or failure about yourself  1 1 - 1 1  Trouble concentrating 1 2 - 1 2  Moving slowly or fidgety/restless 0 1 - 1 0  Suicidal thoughts 0 0 - 0 0  PHQ-9 Score 6 10 - 9 8     CMP Latest Ref Rng & Units 12/03/2017 02/28/2017 10/29/2016  Glucose 65 - 99 mg/dL 79 84 93  BUN 6 - 24 mg/dL 21 21 18   Creatinine 0.57 - 1.00 mg/dL 1.610.93 0.960.92 0.450.74  Sodium 134 - 144 mmol/L 139 138 139  Potassium 3.5 - 5.2 mmol/L 4.8 4.8 4.7  Chloride 96 - 106 mmol/L 102 101 105  CO2 20 - 29 mmol/L 21 21 24   Calcium 8.7 - 10.2 mg/dL 9.6 40.910.2 9.9  Total Protein 6.0 - 8.5 g/dL 7.2 - 7.4  Total Bilirubin 0.0 - 1.2 mg/dL <8.1<0.2 - 0.3  Alkaline Phos 39 - 117 IU/L 87 - 76  AST 0 - 40 IU/L 20 - 29  ALT 0 - 32 IU/L 21 - 34(H)     Lipid Panel     Component Value Date/Time   CHOL 169 12/03/2017 0845   TRIG 136 12/03/2017 0845   HDL 44 12/03/2017 0845   CHOLHDL 3.8 12/03/2017 0845   CHOLHDL 2.7 09/24/2016 0716   VLDL 28 09/24/2016 0716    LDLCALC 98 12/03/2017 0845    Lab Results  Component Value Date   HGBA1C 5.3 (A) 03/05/2018    Assessment & Plan:   1. Other insomnia Controlled - traZODone (DESYREL) 50 MG tablet; Take 1 tablet (50 mg total) by mouth at bedtime as needed for sleep.  Dispense: 30 tablet; Refill: 3  2. Essential hypertension Controlled Counseled on blood pressure goal of less than 130/80, low-sodium, DASH diet, medication compliance, 150 minutes of moderate intensity exercise  per week. Discussed medication compliance, adverse effects. - lisinopril (PRINIVIL,ZESTRIL) 10 MG tablet; Take 1 tablet (10 mg total) by mouth daily.  Dispense: 30 tablet; Refill: 3  3. Paresthesia Stable - gabapentin (NEURONTIN) 300 MG capsule; Take 1 capsule (300 mg total) by mouth 3 (three) times daily.  Dispense: 60 capsule; Refill: 3  4. Screening for diabetes mellitus Normal with A1c of 5.3  5. Prolonged Q-T interval on ECG Likely due to combination of Trazodone and Paxil Discontinue Paxil Repeat EKG revealed reduced QTc of 457 compares to 503 from 2 years prior  6. 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 Overactive bladder Controlled - oxybutynin (DITROPAN) 5 MG tablet; Take 1 tablet (5 mg total) by mouth 2 (two) times daily.  Dispense: 60 tablet; Refill: 3  8. Intraparenchymal hemorrhage of brain (HCC) - L thalamic Risk factor modifications - atorvastatin (LIPITOR) 10 MG tablet; Take 1 tablet (10 mg total) by mouth daily.  Dispense: 30 tablet; Refill: 3  9. Anxiety and depression Not fully optimized but phq 9 score is low at 6 Switched to Zoloft Instructed on tapering off Paxil - sertraline (ZOLOFT) 100 MG tablet; Take 1 tablet (100 mg total) by mouth daily.  Dispense: 30 tablet; Refill: 3 - busPIRone (BUSPAR) 10 MG tablet; Take 1 tablet (10 mg total) by mouth 2 (two) times daily.  Dispense: 60 tablet; Refill: 3  10. De Quervain's tenosynovitis, right - diclofenac  (VOLTAREN) 75 MG EC tablet; Take 1 tablet (75 mg total) by mouth 2 (two) times daily with a meal.  Dispense: 60 tablet; Refill: 3   Meds ordered this encounter  Medications  . sertraline (ZOLOFT) 100 MG tablet    Sig: Take 1 tablet (100 mg total) by mouth daily.    Dispense:  30 tablet    Refill:  3    Discontinue Paxil  . traZODone (DESYREL) 50 MG tablet    Sig: Take 1 tablet (50 mg total) by mouth at bedtime as needed for sleep.    Dispense:  30 tablet    Refill:  3  . lisinopril (PRINIVIL,ZESTRIL) 10 MG tablet    Sig: Take 1 tablet (10 mg total) by mouth daily.    Dispense:  30 tablet    Refill:  3  . gabapentin (NEURONTIN) 300 MG capsule    Sig: Take 1 capsule (300 mg total) by mouth 3 (three) times daily.    Dispense:  60 capsule    Refill:  3  . oxybutynin (DITROPAN) 5 MG tablet    Sig: Take 1 tablet (5 mg total) by mouth 2 (two) times daily.    Dispense:  60 tablet    Refill:  3  . atorvastatin (LIPITOR) 10 MG tablet    Sig: Take 1 tablet (10 mg total) by mouth daily.    Dispense:  30 tablet    Refill:  3  . busPIRone (BUSPAR) 10 MG tablet    Sig: Take 1 tablet (10 mg total) by mouth 2 (two) times daily.    Dispense:  60 tablet    Refill:  3  . diclofenac (VOLTAREN) 75 MG EC tablet    Sig: Take 1 tablet (75 mg total) by mouth 2 (two) times daily with a meal.    Dispense:  60 tablet    Refill:  3    Follow-up: Return in about 3 months (around 06/05/2018) for Follow-up of chronic medical conditions.   Hoy Register MD

## 2018-03-05 NOTE — Patient Instructions (Signed)

## 2018-03-11 ENCOUNTER — Other Ambulatory Visit: Payer: Self-pay

## 2018-03-11 ENCOUNTER — Ambulatory Visit: Payer: Medicaid Other | Admitting: Physical Therapy

## 2018-03-11 ENCOUNTER — Encounter: Payer: Self-pay | Admitting: Physical Therapy

## 2018-03-11 DIAGNOSIS — M25661 Stiffness of right knee, not elsewhere classified: Secondary | ICD-10-CM

## 2018-03-11 DIAGNOSIS — R2681 Unsteadiness on feet: Secondary | ICD-10-CM

## 2018-03-11 DIAGNOSIS — R600 Localized edema: Secondary | ICD-10-CM

## 2018-03-11 DIAGNOSIS — M6281 Muscle weakness (generalized): Secondary | ICD-10-CM

## 2018-03-11 DIAGNOSIS — G8929 Other chronic pain: Secondary | ICD-10-CM

## 2018-03-11 DIAGNOSIS — M25561 Pain in right knee: Secondary | ICD-10-CM

## 2018-03-11 NOTE — Therapy (Signed)
[place], [place], [address] Phone: [phone]   Fax:  [phone]  Physical Therapy Treatment/Discharge Note  Patient Details  Name: Kelsey Smith MRN: [id number] Date of Birth: 04-11-1961 Referring Provider: Frankey Shown MD   Encounter Date: 03/11/2018  PT End of Session - 03/11/18 0805    Visit Number  10    Number of Visits  16    Date for PT Re-Evaluation  03/06/18    Authorization Type  MCD- Reauthorized     Authorization Time Period  02/03/2018-03/16/2018    Authorization - Visit Number  10    Authorization - Number of Visits  16    PT Start Time  0802    PT Stop Time  0845    PT Time Calculation (min)  43 min    Activity Tolerance  Patient tolerated treatment well    Behavior During Therapy  [facility] for tasks assessed/performed       Past Medical History:  Diagnosis Date  . Alcoholism ([place])   . Aneurysm, cerebral   . Anxiety   . Arthritis   . Depression   . HLD (hyperlipidemia)   . Hypertension   . Stroke [facility])     Past Surgical History:  Procedure Laterality Date  . ELBOW FRACTURE SURGERY Left   . GYNECOLOGIC CRYOSURGERY      There were no vitals filed for this visit.  Subjective Assessment - 03/11/18 0806    Subjective  I am able to get on the floor and sit on the floor. I am now able to cross my legs on the floor.     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  unlimited    How long can you stand comfortably?  1 to 2 hours shopping    How long can you walk comfortably?  I can walk 30 minutes but gets tired but not limited.  was in [place] walking an hour    Diagnostic tests  MRA    Patient Stated Goals  I want to strengthen my leg and get rid of my limp in my  right leg. 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      Currently in Pain?  No/denies    Pain Location  Knee    Pain Orientation  Right         [facility] PT Assessment - 03/11/18 0810      Assessment   Referring Provider  Frankey Shown MD      Observation/Other Assessments   Focus on Therapeutic Outcomes (FOTO)   FOTO not taken      Functional Tests   Functional tests  Squat;Single leg stance;Sit to Stand      Squat   Comments  Pt able to squat with symmetry all the way to the floor and rise without exacerbating pain      Step Down   Comments  Pt with minimal knee valgus on right      Single Leg Stance   Comments  left 15 sec, right 20 sec      Sit to Stand   Comments  Pt able to rise 15 times without UE support well      AROM  Right Knee Extension  0    Right Knee Flexion  143    Left Knee Extension  0    Left Knee Flexion  145      Strength   Overall Strength  Deficits    Right Hip Flexion  5/5    Right Hip Extension  4+/5    Right Hip ABduction  4+/5    Left Hip Flexion  5/5    Left Hip Extension  5/5    Left Hip ABduction  5/5    Right Knee Flexion  5/5    Right Knee Extension  5/5    Left Knee Flexion  5/5    Left Knee Extension  5/5    Right Ankle Dorsiflexion  5/5    Right Ankle Plantar Flexion  5/5    Left Ankle Dorsiflexion  5/5    Left Ankle Plantar Flexion  5/5      Palpation   Palpation comment  Pt right knee scar with increased  tissue extensibility                   [facility] Adult PT Treatment/Exercise - 03/11/18 0822      High Level Balance   High Level Balance Comments  single leg balance, static and dyanmic actiivites ankle clock      Self-Care   Other Self-Care Comments   able to rise from floor demo and simulate in/ out of tub       Knee/Hip Exercises: Stretches   Passive Hamstring Stretch  3 reps;30 seconds      Knee/Hip Exercises: Standing   Lateral Step Up  Right;Left;15 reps;Hand Hold: 2;2  sets;Step Height: 6"    Forward Step Up  Right;Left;2 sets;Hand Hold: 2;Step Height: 6";15 reps    Step Down  Right;2 sets;Hand Hold: 2 VC for DF or left ankle , R SLS only    Other Standing Knee Exercises  standing hip  extension 15 x with 5 lb on R, 15 x with Ham curl R with 5 lb    Other Standing Knee Exercises  standing lunges at couner bil x 15      Knee/Hip Exercises: Sidelying   Hip ABduction  20 reps;Both 5#      Manual Therapy   Manual Therapy  Soft tissue mobilization    Manual therapy comments  skilled palpation with TPDN    Soft tissue mobilization  IASTYM of right medial knee medical quad /hams       Trigger Point Dry Needling - 03/11/18 0825    Consent Given?  Yes    Education Handout Provided  No previously given    Muscles Treated Lower Body  Hamstring;Quadriceps right side only    Quadriceps Response  Twitch response elicited;Palpable increased muscle length    Hamstring Response  Twitch response elicited;Palpable increased muscle length scar tissue mobiization as well             PT Short Term Goals - 01/23/18 7121      PT SHORT TERM GOAL #1   Title  Pt will be independent with inital HEP    Baseline  Pt returned demo of initial HEP independently    Time  4    Period  Weeks    Status  Achieved      PT SHORT TERM GOAL #2   Title  Pt will do BERG balance test and set LTG     Baseline  Pt with 56/56 BERG  no need  for assistive devise or balance issue    Time  4    Period  Weeks    Status  Achieved      PT SHORT TERM GOAL #3   Title  Pt will be able to negotiate steps with with step over step technique and minimal UE support    Baseline  Pt able to negotiate steps with minimal use of UE and alternating steps x 8 steps    Time  4    Period  Weeks    Status  Achieved      PT SHORT TERM GOAL #4   Title  flexion in right knee to improve to 145 degree flexion in order to improve quality of gait with normal stride length     Baseline  Pt right knee 147  flexion 01-23-18    Time  4    Period  Weeks    Status  Achieved        PT Long Term Goals - 03/11/18 0810      PT LONG TERM GOAL #1   Title  Pt will be independent with advanced HEP    Baseline  independent    Time  6    Period  Weeks    Status  Achieved      PT LONG TERM GOAL #2   Title  gross LE strength to at least 4+/5  to improve gait and functional mobility    Baseline  no limp and at least 4+/5 or 5/5 in bil LE    Time  6    Period  Weeks    Status  Achieved      PT LONG TERM GOAL #3   Title  Pt will be able to perform 15 squats from chair to show improved better motor controll with sit to stand without need for UE support    Baseline  Pt accomplished 02-06-18  without UE support    Time  6    Period  Weeks    Status  Achieved      PT LONG TERM GOAL #4   Title  Pt will be able to ambulate around shops/grocery stores for 1 hour before taking a break due to fatigue    Baseline  Pt is able to shop for 1-2 hours in a store  has begun walking program but not around track    Time  6    Period  Weeks    Status  Achieved      PT LONG TERM GOAL #5   Title  Average pain with ADL's </ = 3/10    Baseline  Pt is able to cross legged sit and arise from the floor.  Pt does not complain of pain    Time  6    Period  Weeks    Status  Achieved            Plan - 03/11/18 0846    Clinical Impression Statement  Pt has completed all LTG's and is ready for discharge. Pt demonstrated rising from the floor and simulating getting in and out of tub with safe and good technique. Pt encouraged to build up endurance by walking daily around track and nearby track,  Pt with 4+/5 to 5/5 LE strength. See flowsheet and WNL AROM.  Pt pleased with current level of function   Rehab Potential  Good    PT Frequency  2x / week    PT Duration  6 weeks  PT Treatment/Interventions  ADLs/Self Care Home Management;Cryotherapy;Electrical Stimulation;Iontophoresis 36m/ml Dexamethasone;Moist  Heat;Ultrasound;Therapeutic exercise;Therapeutic activities;Functional mobility training;Gait training;Stair training;Neuromuscular re-education;Patient/family education;Scar mobilization;Manual techniques;Passive range of motion;Dry needling;Taping;Vasopneumatic Device    PT Next Visit Plan  DC    PT Home Exercise Plan  knee step ups and lateral step ups, SLR with ER, quad set . gastroc stretch, TKE,steps gastroc stretch bil heel raise, lunges at counter advanced HEP    Consulted and Agree with Plan of Care  Patient       Patient will benefit from skilled therapeutic intervention in order to improve the following deficits and impairments:  Abnormal gait, Decreased balance, Decreased range of motion, Decreased mobility, Decreased scar mobility, Pain, Increased fascial restricitons, Difficulty walking, Decreased strength  Visit Diagnosis: Muscle weakness (generalized)  Chronic pain of right knee  Unsteadiness on feet  Localized edema  Stiffness of right knee, not elsewhere classified     Problem List Patient Active Problem List   Diagnosis Date Noted  . Prolonged Q-T interval on ECG 03/05/2018  . Paresthesia 11/29/2017  . Hyperlipidemia 10/21/2017  . Overactive bladder 08/30/2017  . Trigger finger, acquired 08/21/2017  . De Quervain's tenosynovitis, right 04/16/2017  . Carpal tunnel syndrome 02/28/2017  . Insomnia 02/28/2017  . Anxiety and depression 02/28/2017  . Benign essential HTN   . Right hemiparesis ([place]   . Slow transit constipation   . Lethargy   . IVH (intraventricular hemorrhage) ([place] 09/26/2016  . B12 deficiency 09/26/2016  . Hypertensive emergency 09/26/2016  . Essential hypertension 09/26/2016  . 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 Open thigh wound 09/26/2016  . Somnolence   . ETOH abuse   . Tobacco abuse   . Cocaine abuse ([place]   . Pure hypercholesterolemia   . Intraparenchymal hemorrhage of brain ([place] - L thalamic 09/23/2016    LVoncille Lo PT Certified Exercise Expert for the Aging Adult  03/11/18 5:38 PM Phone: [phone]: 3Stacey StreetCNacogdoches [facility] Center[address] [address]Phone: [phone]  Fax:  [phone] Name: G[place]L Smith MRN: [id number] of Birth: [birth year]  PHYSICAL THERAPY DISCHARGE SUMMARY  Visits from Start of Care: 10  Current functional level related to goals / functional outcomes: As above   Remaining deficits: Left scar LE MMT at least 4+/5  Or 5/5   Education / Equipment: HEP Plan: Patient agrees to discharge.  Patient goals were met. Patient is being discharged due to meeting the stated rehab goals.  ?????    And being pleased with current functional level  LVoncille Lo PT Certified Exercise Expert for the Aging Adult  03/11/18 5:38 PM Phone: [phone]: [phone]

## 2018-03-13 ENCOUNTER — Ambulatory Visit: Payer: Medicaid Other | Admitting: Physical Therapy

## 2018-03-19 ENCOUNTER — Telehealth: Payer: Self-pay | Admitting: Family Medicine

## 2018-03-19 ENCOUNTER — Encounter: Payer: Medicaid Other | Attending: Physical Medicine & Rehabilitation | Admitting: Physical Medicine & Rehabilitation

## 2018-03-19 ENCOUNTER — Encounter: Payer: Self-pay | Admitting: Physical Medicine & Rehabilitation

## 2018-03-19 VITALS — BP 127/74 | HR 79 | Ht 61.0 in | Wt 143.0 lb

## 2018-03-19 DIAGNOSIS — M6530 Trigger finger, unspecified finger: Secondary | ICD-10-CM | POA: Diagnosis not present

## 2018-03-19 DIAGNOSIS — M654 Radial styloid tenosynovitis [de Quervain]: Secondary | ICD-10-CM

## 2018-03-19 DIAGNOSIS — F419 Anxiety disorder, unspecified: Secondary | ICD-10-CM | POA: Insufficient documentation

## 2018-03-19 DIAGNOSIS — F329 Major depressive disorder, single episode, unspecified: Secondary | ICD-10-CM | POA: Insufficient documentation

## 2018-03-19 NOTE — Progress Notes (Signed)
Subjective:    Patient ID: Kelsey Smith, female    DOB: 1961-08-19, 57 y.o.   MRN: [id number]  HPI  Kelsey Smith is here in follow-up of her left thalamic hemorrhage and associated deficits.  3 months ago she had increasing wrist and thumb pain on the right and we had to perform trigger point injection on the thumb as well as a wrist injection.  These provided excellent relief and she just now started having pain over the last week or so.  She has gone through therapy for her knee and has noted improvement with the therapy.  She found the dry needling very helpful.  She has gained some strength in the knee as well and she maintains her activity levels.  She continues with AA as well and states she has been clean and intends to stay that way.   Pain Inventory Average Pain 5 Pain Right Now 5 My pain is dull and tingling  In the last 24 hours, has pain interfered with the following? General activity 0 Relation with others 0 Enjoyment of life 0 What TIME of day is your pain at its worst? morning Sleep (in general) Fair  Pain is worse with: walking and standing Pain improves with: medication Relief from Meds: 9  Mobility walk without assistance ability to climb steps?  yes do you drive?  yes  Function not employed: date last employed 2018  Neuro/Psych bladder control problems numbness tingling  Prior Studies Any changes since last visit?  no  Physicians involved in your care Any changes since last visit?  no   Family History  Problem Relation Age of Onset  . Lung cancer Mother   . Dementia Father   . Brain cancer Brother   . Lung cancer Brother   . Breast cancer Maternal Grandmother   . Breast cancer Paternal Aunt   . Colon cancer Neg Hx    Social History   Socioeconomic History  . Marital status: Single    Spouse name: Not on file  . Number of children: 0  . Years of education: Not on file  . Highest education level: Not on file  Occupational History    . Occupation: hair stylist  Social Needs  . Financial resource strain: Not on file  . Food insecurity:    Worry: Not on file    Inability: Not on file  . Transportation needs:    Medical: Not on file    Non-medical: Not on file  Tobacco Use  . Smoking status: Former Smoker    Last attempt to quit: 09/24/2016    Years since quitting: 1.4  . Smokeless tobacco: Never Used  Substance and Sexual Activity  . Alcohol use: No    Comment: quit in AA  09-24-2016  . Drug use: No  . Sexual activity: Not on file  Lifestyle  . Physical activity:    Days per week: Not on file    Minutes per session: Not on file  . Stress: Not on file  Relationships  . Social connections:    Talks on phone: Not on file    Gets together: Not on file    Attends religious service: Not on file    Active member of club or organization: Not on file    Attends meetings of clubs or organizations: Not on file    Relationship status: Not on file  Other Topics Concern  . Not on file  Social History Narrative   Pt doing well, (  not doing drugs, alcohol, going to AA .     Past Surgical History:  Procedure Laterality Date  . ELBOW FRACTURE SURGERY Left   . GYNECOLOGIC CRYOSURGERY     Past Medical History:  Diagnosis Date  . Alcoholism (HCC)   . Aneurysm, cerebral   . Anxiety   . Arthritis   . Depression   . HLD (hyperlipidemia)   . Hypertension   . Stroke (HCC)    BP 127/74   Pulse 79   Ht 5\' 1"  (1.549 m)   Wt 143 lb (64.9 kg)   SpO2 98%   BMI 27.02 kg/m   Opioid Risk Score:   Fall Risk Score:  `1  Depression screen PHQ 2/9  Depression screen [facility] 2/9 03/05/2018 08/30/2017 04/16/2017 02/28/2017 11/28/2016 11/06/2016 10/29/2016  Decreased Interest 1 1 1 1 1  0 0  Down, Depressed, Hopeless 1 1 1 1 1  0 0  PHQ - 2 Score 2 2 2 2 2  0 0  Altered sleeping 1 3 - 3 2 - -  Tired, decreased energy 1 1 - 1 1 - -  Change in appetite 0 0 - 0 0 - -  Feeling bad or failure about yourself  1 1 - 1 1 - -  Trouble  concentrating 1 2 - 1 2 - -  Moving slowly or fidgety/restless 0 1 - 1 0 - -  Suicidal thoughts 0 0 - 0 0 - -  PHQ-9 Score 6 10 - 9 8 - -     Review of Systems  Constitutional: Negative.   HENT: Negative.   Eyes: Negative.   Respiratory: Negative.   Cardiovascular: Negative.   Gastrointestinal: Negative.   Endocrine: Negative.   Genitourinary: Positive for difficulty urinating.  Musculoskeletal: Positive for arthralgias, joint swelling and myalgias.  Skin: Negative.   Allergic/Immunologic: Negative.   Neurological: Positive for numbness.  Hematological: Negative.   Psychiatric/Behavioral: Negative.   All other systems reviewed and are negative.      Objective:   Physical Exam  General: No acute distress HEENT: EOMI, oral membranes moist Cards: reg rate  Chest: normal effort Abdomen: Soft, NT, ND Skin: dry, intact Extremities: no edema  Musculoskeletal: Positive tenderness Finkelsteins test positive once again.  Along APLand EPB.  She also has recurrent pain at the base of the right thumb with a catch noted during flexion and extension.. Neurological:   Continued mild sensory loss along the right arm and leg with light touch and proprioception.  Motor:motor function nearly 5/5 in all 4's. Reasonable insight and awareness.  Language and memory are functional. Skin: Skin is warm and dry. Scar right knee Psychiatric: She is anxious today but pleasant      Assessment & Plan:  Medical Problem List and Plan: 1. 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 -May pursue light vocational activities as tolerated.  She has talked about going back into hairdressing..   3. Right and left leg pain -?HEP 4. Mood:  -on buspar and paxil currently.  Increase BuSpar to 10 mg twice daily 5. Polysubstance abuse: continue AA -Select risk for relapse.   Encouraged her to maintain her AA involvement 6. Right wrist pain: predominantly de quervain's tenosynovitis. Patient also with right thumb pain and trigger finger -nerve pain related to thalamic injury        -After informed consent and preparation of the skin with betadine and isopropyl alcohol, I injected 3mg  (0.5cc) of celestone and 2cc  of 1% lidocaine around the proximal pulley of the right thumb via anterior approach. Additionally, aspiration was performed prior to injection. The patient tolerated well, and no complications were encountered. Afterward the area was cleaned and dressed. Post- injection instructions were provided.       -After informed consent and preparation of the skin with betadine and isopropyl alcohol, I injected 3mg  (0.5cc) of celestone and 2cc of 1% lidocaine around the right APL/EPB tendons via posterior approach. Additionally, aspiration was performed prior to injection. The patient tolerated well, and no complications were encountered. Afterward the area was cleaned and dressed. Post- injection instructions were provided.    -maintainice to wrist/splint (night time at least) -can use diclofenac prn  Follow up in12months. of face to face patient care time were spent during this visit. All questions were encouraged and answered.

## 2018-03-19 NOTE — Patient Instructions (Signed)
ALLEGRA OR CLARITIN FOR ALLERGIES ?

## 2018-03-26 DIAGNOSIS — H5213 Myopia, bilateral: Secondary | ICD-10-CM | POA: Diagnosis not present

## 2018-03-26 DIAGNOSIS — H40033 Anatomical narrow angle, bilateral: Secondary | ICD-10-CM | POA: Diagnosis not present

## 2018-03-26 DIAGNOSIS — H16223 Keratoconjunctivitis sicca, not specified as Sjogren's, bilateral: Secondary | ICD-10-CM | POA: Diagnosis not present

## 2018-04-18 DIAGNOSIS — H1013 Acute atopic conjunctivitis, bilateral: Secondary | ICD-10-CM | POA: Diagnosis not present

## 2018-04-18 DIAGNOSIS — H5203 Hypermetropia, bilateral: Secondary | ICD-10-CM | POA: Diagnosis not present

## 2018-06-06 ENCOUNTER — Telehealth: Payer: Self-pay

## 2018-06-06 NOTE — Telephone Encounter (Signed)
Diclofenac is not preferred by medicaid, they want ibuprofen, indomethacin, ketorolac, meloxicam, naproxen, or sulindac   The only exception is if she has failed at least two of the preferred drugs.

## 2018-06-09 MED ORDER — MELOXICAM 7.5 MG PO TABS
7.5000 mg | ORAL_TABLET | Freq: Every day | ORAL | 3 refills | Status: DC
Start: 2018-06-09 — End: 2018-06-23

## 2018-06-09 NOTE — Telephone Encounter (Signed)
I have switched to the preferred.

## 2018-06-10 ENCOUNTER — Ambulatory Visit: Payer: Medicaid Other | Admitting: Family Medicine

## 2018-06-23 ENCOUNTER — Encounter: Payer: Self-pay | Admitting: Physical Medicine & Rehabilitation

## 2018-06-23 ENCOUNTER — Encounter: Payer: Medicaid Other | Attending: Physical Medicine & Rehabilitation | Admitting: Physical Medicine & Rehabilitation

## 2018-06-23 VITALS — BP 141/92 | HR 79 | Resp 14 | Ht 61.0 in | Wt 135.0 lb

## 2018-06-23 DIAGNOSIS — E785 Hyperlipidemia, unspecified: Secondary | ICD-10-CM | POA: Diagnosis not present

## 2018-06-23 DIAGNOSIS — Z87891 Personal history of nicotine dependence: Secondary | ICD-10-CM | POA: Insufficient documentation

## 2018-06-23 DIAGNOSIS — M79605 Pain in left leg: Secondary | ICD-10-CM | POA: Diagnosis present

## 2018-06-23 DIAGNOSIS — G8929 Other chronic pain: Secondary | ICD-10-CM | POA: Insufficient documentation

## 2018-06-23 DIAGNOSIS — R531 Weakness: Secondary | ICD-10-CM | POA: Diagnosis not present

## 2018-06-23 DIAGNOSIS — Z8673 Personal history of transient ischemic attack (TIA), and cerebral infarction without residual deficits: Secondary | ICD-10-CM | POA: Diagnosis not present

## 2018-06-23 DIAGNOSIS — Z791 Long term (current) use of non-steroidal anti-inflammatories (NSAID): Secondary | ICD-10-CM | POA: Insufficient documentation

## 2018-06-23 DIAGNOSIS — M79604 Pain in right leg: Secondary | ICD-10-CM | POA: Diagnosis present

## 2018-06-23 DIAGNOSIS — F1910 Other psychoactive substance abuse, uncomplicated: Secondary | ICD-10-CM | POA: Insufficient documentation

## 2018-06-23 DIAGNOSIS — M79644 Pain in right finger(s): Secondary | ICD-10-CM | POA: Diagnosis not present

## 2018-06-23 DIAGNOSIS — I10 Essential (primary) hypertension: Secondary | ICD-10-CM | POA: Insufficient documentation

## 2018-06-23 DIAGNOSIS — M6530 Trigger finger, unspecified finger: Secondary | ICD-10-CM | POA: Diagnosis not present

## 2018-06-23 DIAGNOSIS — R51 Headache: Secondary | ICD-10-CM | POA: Diagnosis not present

## 2018-06-23 DIAGNOSIS — M654 Radial styloid tenosynovitis [de Quervain]: Secondary | ICD-10-CM | POA: Diagnosis not present

## 2018-06-23 DIAGNOSIS — M25531 Pain in right wrist: Secondary | ICD-10-CM | POA: Insufficient documentation

## 2018-06-23 MED ORDER — DICLOFENAC SODIUM 50 MG PO TBEC: 50.0000 mg | DELAYED_RELEASE_TABLET | Freq: Two times a day (BID) | ORAL | 3 refills | Status: DC

## 2018-06-23 MED ORDER — LISINOPRIL 10 MG PO TABS: 10.0000 mg | ORAL_TABLET | Freq: Every day | ORAL | 3 refills | Status: DC

## 2018-06-23 NOTE — Progress Notes (Signed)
Subjective:    Patient ID: Kelsey Smith, female    DOB: 09/17/1961, 57 y.o.   MRN: [id number]  HPI   Patient is here in follow-up of her thalamic hemorrhage and chronic pain.  She states that for the most part she has been doing fairly well over the last few months.  Her right hand is beginning to act up once again and she is requesting follow-up injections.  The last 2 injections have worked quite well for her.  She has noticed an increase in headaches over the last month or so, mostly in her suboccipital area.  Headaches are not that severe but can affect sleep.  She is had her vision checked recently.  Denies any sinus symptoms.  She has used some Tylenol for pain.  She also is on meloxicam 7.5 mg daily.  She asked about her antidepressants.  She was switched to Zoloft from Paxil to see if it might help with hot flashes and found that the Zoloft cause more generalized sweating.  She asked if she go back to her Paxil.    Pain Inventory Average Pain 6 Pain Right Now 6 My pain is aching  In the last 24 hours, has pain interfered with the following? General activity 3 Relation with others 0 Enjoyment of life 3 What TIME of day is your pain at its worst? morning, evening Sleep (in general) Fair  Pain is worse with: some activites Pain improves with: medication and injections Relief from Meds: 8  Mobility walk without assistance ability to climb steps?  yes do you drive?  yes  Function not employed: date last employed .  Neuro/Psych trouble walking depression  Prior Studies Any changes since last visit?  no  Physicians involved in your care Any changes since last visit?  no   Family History  Problem Relation Age of Onset  . Lung cancer Mother   . Dementia Father   . Brain cancer Brother   . Lung cancer Brother   . Breast cancer Maternal Grandmother   . Breast cancer Paternal Aunt   . Colon cancer Neg Hx    Social History   Socioeconomic History  . Marital  status: Single    Spouse name: Not on file  . Number of children: 0  . Years of education: Not on file  . Highest education level: Not on file  Occupational History  . Occupation: hair stylist  Social Needs  . Financial resource strain: Not on file  . Food insecurity:    Worry: Not on file    Inability: Not on file  . Transportation needs:    Medical: Not on file    Non-medical: Not on file  Tobacco Use  . Smoking status: Former Smoker    Last attempt to quit: 09/24/2016    Years since quitting: 1.7  . Smokeless tobacco: Never Used  Substance and Sexual Activity  . Alcohol use: No    Comment: quit in AA  09-24-2016  . Drug use: No  . Sexual activity: Not on file  Lifestyle  . Physical activity:    Days per week: Not on file    Minutes per session: Not on file  . Stress: Not on file  Relationships  . Social connections:    Talks on phone: Not on file    Gets together: Not on file    Attends religious service: Not on file    Active member of club or organization: Not on file  Attends meetings of clubs or organizations: Not on file    Relationship status: Not on file  Other Topics Concern  . Not on file  Social History Narrative   Pt doing well, (not doing drugs, alcohol, going to AA .     Past Surgical History:  Procedure Laterality Date  . ELBOW FRACTURE SURGERY Left   . GYNECOLOGIC CRYOSURGERY     Past Medical History:  Diagnosis Date  . Alcoholism (HCC)   . Aneurysm, cerebral   . Anxiety   . Arthritis   . Depression   . HLD (hyperlipidemia)   . Hypertension   . Stroke (HCC)    BP (!) 141/92   Pulse 79   Resp 14   Ht 5\' 1"  (1.549 m)   Wt 135 lb (61.2 kg)   SpO2 98%   BMI 25.51 kg/m   Opioid Risk Score:   Fall Risk Score:  `1  Depression screen PHQ 2/9  Depression screen [facility] 2/9 03/05/2018 08/30/2017 04/16/2017 02/28/2017 11/28/2016 11/06/2016 10/29/2016  Decreased Interest 1 1 1 1 1  0 0  Down, Depressed, Hopeless 1 1 1 1 1  0 0  PHQ - 2 Score 2 2 2 2 2  0  0  Altered sleeping 1 3 - 3 2 - -  Tired, decreased energy 1 1 - 1 1 - -  Change in appetite 0 0 - 0 0 - -  Feeling bad or failure about yourself  1 1 - 1 1 - -  Trouble concentrating 1 2 - 1 2 - -  Moving slowly or fidgety/restless 0 1 - 1 0 - -  Suicidal thoughts 0 0 - 0 0 - -  PHQ-9 Score 6 10 - 9 8 - -    Review of Systems  Constitutional: Negative.   HENT: Negative.   Eyes: Negative.   Respiratory: Negative.   Cardiovascular: Negative.   Gastrointestinal: Negative.   Endocrine: Negative.   Genitourinary: Negative.   Musculoskeletal: Positive for arthralgias and gait problem.  Skin: Negative.   Allergic/Immunologic: Negative.   Hematological: Negative.   Psychiatric/Behavioral: Positive for dysphoric mood.  All other systems reviewed and are negative.      Objective:   Physical Exam  General: No acute distress HEENT: EOMI, oral membranes moist Cards: reg rate  Chest: normal effort Abdomen: Soft, NT, ND Skin: dry, intact Extremities: no edema Musculoskeletal:+finkelstein Along APLand EPB.She also has recurrent pain at the base of the right thumb at proximal pulley with catch  Neurological:  ongoing sensory loss along the right arm and leg with light touch and proprioception. Motor:motor function nearly 5/5 in all 4's. Reasonable insight and awareness. Language and memory are functional. Skin: Skin is warm and dry. Scar right knee Psychiatric:She is anxious today but pleasant      Assessment & Plan:  Medical Problem List and Plan: 1. 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 -Encouraged volunteer work or vocational activities to tolerance  -Discussed her headaches today.  These appear generally mild and nonspecific.  May be musculoskeletal related to her neck.  Could even be related to the change in her antidepressants recently.  I would observe for  now.  3. Right and left leg pain -?HEP 4. Mood:  -on buspar and Zoloft.  I would see any reason why she could not go back to her Paxil although the 10 mg of Paxil is not equivalent to the 100 mg of Zoloft that she currently  was given.  She may want to follow-up with her primary. 5. Polysubstance abuse: continue AA -Select risk for relapse. Encouraged her to maintain her AA involvement 6. Right wrist pain: predominantly de quervain's tenosynovitis.Patient also with right thumb pain and trigger finger -nerve pain related to thalamic injury          -After informed consent and preparation of the skin with betadine and isopropyl alcohol, I injected 3mg  (0.5cc) of celestone and 2cc of 1% lidocaine around the right  first finger flexor tendon at the A1 pulley via anterior approach. Additionally, aspiration was performed prior to injection. The patient tolerated well, and no complications were encountered. Afterward the area was cleaned and dressed. Post- injection instructions were provided.         -After informed consent and preparation of the skin with betadine and isopropyl alcohol, I injected 3  mg (0.5cc) of celestone and 2cc of 1% lidocaine around the right APL/EPB   via anterior approach. Additionally, aspiration was performed prior to injection. The patient tolerated well, and no complications were encountered. Afterward the area was cleaned and dressed. Post- injection instructions were provided.          -maintainice to wrist/splint(night time at least) -can use diclofenac 50 mg bid for a month then as needed/stop meloxicam  Follow up in38months. of face to face patient care time were spent during this visit. All questions were encouraged and answered.

## 2018-06-23 NOTE — Patient Instructions (Signed)
DICLOFENAC SCHEDULE FOR A MONTH , THEN AS NEEDED.

## 2018-07-04 NOTE — Telephone Encounter (Signed)
error 

## 2018-07-21 ENCOUNTER — Other Ambulatory Visit: Payer: Self-pay | Admitting: Family Medicine

## 2018-07-21 DIAGNOSIS — Z1231 Encounter for screening mammogram for malignant neoplasm of breast: Secondary | ICD-10-CM

## 2018-07-23 ENCOUNTER — Other Ambulatory Visit: Payer: Self-pay | Admitting: Family Medicine

## 2018-07-23 DIAGNOSIS — G4709 Other insomnia: Secondary | ICD-10-CM

## 2018-08-19 ENCOUNTER — Ambulatory Visit
Admission: RE | Admit: 2018-08-19 | Discharge: 2018-08-19 | Disposition: A | Discharge status: Home / Self Care | Payer: Medicaid Other | Source: Ambulatory Visit | Attending: Family Medicine | Admitting: Family Medicine

## 2018-08-19 DIAGNOSIS — Z1231 Encounter for screening mammogram for malignant neoplasm of breast: Secondary | ICD-10-CM

## 2018-08-20 ENCOUNTER — Telehealth: Payer: Self-pay

## 2018-08-20 NOTE — Telephone Encounter (Signed)
Patient was called and informed of lab results. 

## 2018-08-20 NOTE — Telephone Encounter (Signed)
-----   Message from Hoy RegisterEnobong Newlin, MD sent at 08/20/2018  1:57 PM EST ----- Mammogram is negative for malignancy

## 2018-09-22 ENCOUNTER — Encounter: Payer: Self-pay | Admitting: Physical Medicine & Rehabilitation

## 2018-09-22 ENCOUNTER — Encounter: Payer: Medicaid Other | Attending: Physical Medicine & Rehabilitation | Admitting: Physical Medicine & Rehabilitation

## 2018-09-22 ENCOUNTER — Other Ambulatory Visit: Payer: Self-pay

## 2018-09-22 VITALS — BP 132/86 | HR 75 | Ht 60.0 in | Wt 141.4 lb

## 2018-09-22 DIAGNOSIS — G8929 Other chronic pain: Secondary | ICD-10-CM | POA: Insufficient documentation

## 2018-09-22 DIAGNOSIS — M6530 Trigger finger, unspecified finger: Secondary | ICD-10-CM

## 2018-09-22 DIAGNOSIS — I69119 Unspecified symptoms and signs involving cognitive functions following nontraumatic intracerebral hemorrhage: Secondary | ICD-10-CM | POA: Insufficient documentation

## 2018-09-22 DIAGNOSIS — E785 Hyperlipidemia, unspecified: Secondary | ICD-10-CM | POA: Insufficient documentation

## 2018-09-22 DIAGNOSIS — F32A Depression, unspecified: Secondary | ICD-10-CM

## 2018-09-22 DIAGNOSIS — F419 Anxiety disorder, unspecified: Secondary | ICD-10-CM | POA: Insufficient documentation

## 2018-09-22 DIAGNOSIS — M79605 Pain in left leg: Secondary | ICD-10-CM | POA: Diagnosis not present

## 2018-09-22 DIAGNOSIS — M79604 Pain in right leg: Secondary | ICD-10-CM | POA: Insufficient documentation

## 2018-09-22 DIAGNOSIS — G4709 Other insomnia: Secondary | ICD-10-CM

## 2018-09-22 DIAGNOSIS — M654 Radial styloid tenosynovitis [de Quervain]: Secondary | ICD-10-CM

## 2018-09-22 DIAGNOSIS — R269 Unspecified abnormalities of gait and mobility: Secondary | ICD-10-CM | POA: Insufficient documentation

## 2018-09-22 DIAGNOSIS — I619 Nontraumatic intracerebral hemorrhage, unspecified: Secondary | ICD-10-CM

## 2018-09-22 DIAGNOSIS — F329 Major depressive disorder, single episode, unspecified: Secondary | ICD-10-CM | POA: Diagnosis not present

## 2018-09-22 DIAGNOSIS — R202 Paresthesia of skin: Secondary | ICD-10-CM

## 2018-09-22 DIAGNOSIS — I10 Essential (primary) hypertension: Secondary | ICD-10-CM | POA: Diagnosis not present

## 2018-09-22 DIAGNOSIS — M1990 Unspecified osteoarthritis, unspecified site: Secondary | ICD-10-CM | POA: Diagnosis not present

## 2018-09-22 DIAGNOSIS — M79644 Pain in right finger(s): Secondary | ICD-10-CM | POA: Insufficient documentation

## 2018-09-22 DIAGNOSIS — Z87891 Personal history of nicotine dependence: Secondary | ICD-10-CM | POA: Diagnosis not present

## 2018-09-22 MED ORDER — ATORVASTATIN CALCIUM 10 MG PO TABS: 10.0000 mg | ORAL_TABLET | Freq: Every day | ORAL | 3 refills | Status: DC

## 2018-09-22 MED ORDER — TRAZODONE HCL 50 MG PO TABS: 50.0000 mg | ORAL_TABLET | Freq: Every evening | ORAL | 0 refills | Status: DC | PRN

## 2018-09-22 MED ORDER — PAROXETINE HCL 20 MG PO TABS: 20.0000 mg | ORAL_TABLET | Freq: Every day | ORAL | 3 refills | Status: DC

## 2018-09-22 MED ORDER — GABAPENTIN 300 MG PO CAPS: 300.0000 mg | ORAL_CAPSULE | Freq: Three times a day (TID) | ORAL | 3 refills | Status: DC

## 2018-09-22 MED ORDER — DICLOFENAC SODIUM 50 MG PO TBEC: 50.0000 mg | DELAYED_RELEASE_TABLET | Freq: Two times a day (BID) | ORAL | 3 refills | Status: DC

## 2018-09-22 NOTE — Patient Instructions (Signed)
START GETTING OUT VOLUNTEERING/WORKING 4 HOURS A WEEK TO START   FOR WRIST, SPLINT AND ICE AS NEEDED  RESUME GABAPENTIN AN DICLOFENAC

## 2018-09-22 NOTE — Progress Notes (Signed)
Subjective:    Patient ID: Kelsey Smith, female    DOB: 1961-07-29, 57 y.o.   MRN: [id number]   HPI   Kelsey Smith is here in follow up of her chronic pain and gait deficits. Today's the 2 year "anniversary" of her stroke. She has continued to do fairly well from a mobility standpoint.   She is having a little more pain in her right wrist.  However she has not been taking gabapentin or diclofenac.  She is not applying ice or using the splint as we discussed either.  I asked her if she began volunteering and she said no she had not made it to that yet.  She has been a bit more anxious and depressed and does not always like to get out of the house a lot.  She did switch back to her Paxil 10 mg well she had been on Zoloft 100 mg prior.    Pain Inventory Average Pain 5 Pain Right Now 5 My pain is dull and aching  In the last 24 hours, has pain interfered with the following? General activity 2 Relation with others 0 Enjoyment of life 2 What TIME of day is your pain at its worst? morning and night Sleep (in general) Fair  Pain is worse with: some activites Pain improves with: medication and injections Relief from Meds: 5  Mobility walk without assistance ability to climb steps?  yes do you drive?  yes  Function not employed: date last employed n/a  Neuro/Psych No problems in this area  Prior Studies Any changes since last visit?  no  Physicians involved in your care Primary care [facility] Neurologist [facility]   Family History  Problem Relation Age of Onset  . Lung cancer Mother   . Dementia Father   . Brain cancer Brother   . Lung cancer Brother   . Breast cancer Maternal Grandmother   . Breast cancer Paternal Aunt   . Colon cancer Neg Hx    Social History   Socioeconomic History  . Marital status: Single    Spouse name: Not on file  . Number of children: 0  . Years of education: Not on file  . Highest education level: Not on  file  Occupational History  . Occupation: hair stylist  Social Needs  . Financial resource strain: Not on file  . Food insecurity:    Worry: Not on file    Inability: Not on file  . Transportation needs:    Medical: Not on file    Non-medical: Not on file  Tobacco Use  . Smoking status: Former Smoker    Last attempt to quit: 09/24/2016    Years since quitting: 1.9  . Smokeless tobacco: Never Used  Substance and Sexual Activity  . Alcohol use: No    Comment: quit in AA  09-24-2016  . Drug use: No  . Sexual activity: Not on file  Lifestyle  . Physical activity:    Days per week: Not on file    Minutes per session: Not on file  . Stress: Not on file  Relationships  . Social connections:    Talks on phone: Not on file    Gets together: Not on file    Attends religious service: Not on file    Active member of club or organization: Not on file    Attends meetings of clubs or organizations: Not on file    Relationship status: Not on file  Other  Topics Concern  . Not on file  Social History Narrative   Pt doing well, (not doing drugs, alcohol, going to AA .     Past Surgical History:  Procedure Laterality Date  . ELBOW FRACTURE SURGERY Left   . GYNECOLOGIC CRYOSURGERY     Past Medical History:  Diagnosis Date  . Alcoholism (HCC)   . Aneurysm, cerebral   . Anxiety   . Arthritis   . Depression   . HLD (hyperlipidemia)   . Hypertension   . Stroke (HCC)    BP 132/86   Pulse 75   Ht 5' (1.524 m)   Wt 141 lb 6.4 oz (64.1 kg)   SpO2 95%   BMI 27.62 kg/m   Opioid Risk Score:   Fall Risk Score:  `1  Depression screen PHQ 2/9  [facility] 09/22/2018 03/05/2018 08/30/2017 04/16/2017 02/28/2017 11/28/2016 11/06/2016  Decreased Interest 1 1 1 1 1 1  0  Down, Depressed, Hopeless 1 1 1 1 1 1  0  PHQ - 2 Score 2 2 2 2 2 2  0  Altered sleeping - 1 3 - 3 2 -  Tired, decreased energy - 1 1 - 1 1 -  Change in appetite - 0 0 - 0 0 -  Feeling bad or failure about yourself   - 1 1 - 1 1 -  Trouble concentrating - 1 2 - 1 2 -  Moving slowly or fidgety/restless - 0 1 - 1 0 -  Suicidal thoughts - 0 0 - 0 0 -  PHQ-9 Score - 6 10 - 9 8 -    Review of Systems  Constitutional: Negative.   HENT: Negative.   Eyes: Negative.   Respiratory: Negative.   Cardiovascular: Negative.   Gastrointestinal: Negative.   Endocrine: Negative.   Genitourinary: Negative.   Musculoskeletal: Negative.   Skin: Negative.   Allergic/Immunologic: Negative.   Neurological: Negative.   Hematological: Negative.   Psychiatric/Behavioral: Negative.   All other systems reviewed and are negative.      Objective:   Physical Exam  General: No acute distress HEENT: EOMI, oral membranes moist Cards: reg rate  Chest: normal effort Abdomen: Soft, NT, ND Skin: dry, intact Extremities: no edema Musculoskeletal:+finkelstein still along APLand EPB.   Neurological: ongoing sensory loss along the right arm and leg with light touch and proprioception. Motor:motor function nearly 5/5 in all 4's. Reasonable insight and awareness. Language and memory are functional. Skin: Skin is warm and dry. Scar right knee Psychiatric:a little more anxious today      Assessment & Plan:   1. 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 -Encouraged volunteer work or vocational activities to tolerance.  Initial target should be 4 hours/week             -headaches are better  3. Right and left leg pain -?HEP 4. Mood:  -on buspar and paxil.  Increase Paxil to 20 mg daily 5. Polysubstance abuse: continue AA -Select risk for relapse. Encouraged her to maintain her AA involvement 6. Right wrist pain: predominantly de quervain's tenosynovitis.Patient also with right thumb pain and trigger finger -nerve pain related to thalamic injury              -needs  to useice to wrist/splint ) -Refilled diclofenac and gabapentin today.  Follow up in674months.15minutes of face to face patient care time were spent during this visit. All questions were encouraged and answered.

## 2018-10-14 ENCOUNTER — Other Ambulatory Visit: Payer: Self-pay | Admitting: Physical Medicine & Rehabilitation

## 2018-10-14 DIAGNOSIS — I10 Essential (primary) hypertension: Secondary | ICD-10-CM

## 2018-12-30 ENCOUNTER — Other Ambulatory Visit: Payer: Self-pay | Admitting: *Deleted

## 2018-12-30 DIAGNOSIS — G4709 Other insomnia: Secondary | ICD-10-CM

## 2018-12-30 MED ORDER — TRAZODONE HCL 50 MG PO TABS: 50.0000 mg | ORAL_TABLET | Freq: Every evening | ORAL | 0 refills | Status: DC | PRN

## 2019-01-20 ENCOUNTER — Telehealth: Payer: Self-pay

## 2019-01-20 DIAGNOSIS — I10 Essential (primary) hypertension: Secondary | ICD-10-CM

## 2019-01-20 MED ORDER — LISINOPRIL 10 MG PO TABS: 10.0000 mg | ORAL_TABLET | Freq: Every day | ORAL | 6 refills | Status: DC

## 2019-01-20 NOTE — Telephone Encounter (Signed)
Med RF'ed 

## 2019-01-20 NOTE — Telephone Encounter (Signed)
Recieved faxed medication refill request for Lisinopril 10mg  1 tab pod, no mention in previous notes on being prescribed this medication from this clinic.  Unsure wether to refill or forward to primary care provider.  Please advise.

## 2019-01-21 ENCOUNTER — Encounter: Payer: Self-pay | Admitting: Physical Medicine & Rehabilitation

## 2019-01-21 ENCOUNTER — Other Ambulatory Visit: Payer: Self-pay

## 2019-01-21 ENCOUNTER — Encounter: Payer: Medicaid Other | Attending: Physical Medicine & Rehabilitation | Admitting: Physical Medicine & Rehabilitation

## 2019-01-21 VITALS — Ht 61.0 in | Wt 145.0 lb

## 2019-01-21 DIAGNOSIS — M654 Radial styloid tenosynovitis [de Quervain]: Secondary | ICD-10-CM | POA: Diagnosis not present

## 2019-01-21 DIAGNOSIS — I10 Essential (primary) hypertension: Secondary | ICD-10-CM | POA: Diagnosis not present

## 2019-01-21 DIAGNOSIS — I619 Nontraumatic intracerebral hemorrhage, unspecified: Secondary | ICD-10-CM | POA: Diagnosis not present

## 2019-01-21 DIAGNOSIS — M6530 Trigger finger, unspecified finger: Secondary | ICD-10-CM | POA: Diagnosis not present

## 2019-01-21 NOTE — Progress Notes (Signed)
Subjective:    Patient ID: Kelsey Smith, female    DOB: [birth year], 58 y.o.   MRN: [id number]  HPI   Due to national recommendations of social distancing because of COVID 2719, an audio/video tele-health visit is felt to be the most appropriate encounter for this patient at this time. See MyChart message from today for the patient's consent to a tele-health encounter with [facility]. This is a follow up telephone visit for the patient who is at home. MD is at office.    I am meeting with the patient today regarding chronic pain and gait deficits. She states that things are going fairly well at home. She is a little bored staying at home but is getting outside and trying to exercise. Her pain levels are fairly controlled. She is wearing her wrist splint at night. She uses gabapentin and voltaren as prescribed. She denies any new problems. Mood has been up beat. She never got involved in any volunteering prior to Covid but was looking at an opp at a nearby SNF.  Pain Inventory Average Pain 5 Pain Right Now 5 My pain is sharp, dull and aching  In the last 24 hours, has pain interfered with the following? General activity 5 Relation with others 5 Enjoyment of life 5 What TIME of day is your pain at its worst? night Sleep (in general) Fair  Pain is worse with: walking, standing and some activites Pain improves with: rest, heat/ice, therapy/exercise, pacing activities and medication Relief from Meds: 4  Mobility walk without assistance ability to climb steps?  yes do you drive?  yes  Function disabled: date disabled 2018  Neuro/Psych trouble walking  Prior Studies Any changes since last visit?  no  Physicians involved in your care Any changes since last visit?  no   Family History  Problem Relation Age of Onset  . Lung cancer Mother   . Dementia Father   . Brain cancer Brother   . Lung cancer Brother   . Breast cancer Maternal  Grandmother   . Breast cancer Paternal Aunt   . Colon cancer Neg Hx    Social History   Socioeconomic History  . Marital status: Single    Spouse name: Not on file  . Number of children: 0  . Years of education: Not on file  . Highest education level: Not on file  Occupational History  . Occupation: hair stylist  Social Needs  . Financial resource strain: Not on file  . Food insecurity:    Worry: Not on file    Inability: Not on file  . Transportation needs:    Medical: Not on file    Non-medical: Not on file  Tobacco Use  . Smoking status: Former Smoker    Last attempt to quit: 09/24/2016    Years since quitting: 2.3  . Smokeless tobacco: Never Used  Substance and Sexual Activity  . Alcohol use: No    Comment: quit in AA  09-24-2016  . Drug use: No  . Sexual activity: Not on file  Lifestyle  . Physical activity:    Days per week: Not on file    Minutes per session: Not on file  . Stress: Not on file  Relationships  . Social connections:    Talks on phone: Not on file    Gets together: Not on file    Attends religious service: Not on file    Active member of club or organization: Not  on file    Attends meetings of clubs or organizations: Not on file    Relationship status: Not on file  Other Topics Concern  . Not on file  Social History Narrative   Pt doing well, (not doing drugs, alcohol, going to AA .     Past Surgical History:  Procedure Laterality Date  . ELBOW FRACTURE SURGERY Left   . GYNECOLOGIC CRYOSURGERY     Past Medical History:  Diagnosis Date  . Alcoholism (HCC)   . Aneurysm, cerebral   . Anxiety   . Arthritis   . Depression   . HLD (hyperlipidemia)   . Hypertension   . Stroke [facility])    There were no vitals taken for this visit.  Opioid Risk Score:   Fall Risk Score:  `1  Depression screen PHQ 2/9  Depression screen [facility] 2/9 09/22/2018 03/05/2018 08/30/2017 04/16/2017 02/28/2017 11/28/2016 11/06/2016  Decreased Interest 1 1 1 1 1 1  0  Down,  Depressed, Hopeless 1 1 1 1 1 1  0  PHQ - 2 Score 2 2 2 2 2 2  0  Altered sleeping - 1 3 - 3 2 -  Tired, decreased energy - 1 1 - 1 1 -  Change in appetite - 0 0 - 0 0 -  Feeling bad or failure about yourself  - 1 1 - 1 1 -  Trouble concentrating - 1 2 - 1 2 -  Moving slowly or fidgety/restless - 0 1 - 1 0 -  Suicidal thoughts - 0 0 - 0 0 -  PHQ-9 Score - 6 10 - 9 8 -     Review of Systems  Constitutional: Negative.   HENT: Negative.   Eyes: Negative.   Respiratory: Negative.   Cardiovascular: Negative.   Gastrointestinal: Negative.   Endocrine: Negative.   Genitourinary: Negative.   Musculoskeletal: Positive for arthralgias and joint swelling.  Skin: Negative.   Allergic/Immunologic: Negative.   Neurological: Negative.   Hematological: Negative.   Psychiatric/Behavioral: Negative.   All other systems reviewed and are negative.        Assessment & Plan:  1. 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 -she has remained active at home and HEP          -volunteering at some pointwhen social distancing relaxed 3. Right and left leg pain--neuropathic component -?HEP 4. Mood:  -on busparand paxil.  Increase Paxil to 20 mg daily           -seems to be in a good place at present 5. Polysubstance abuse: continue AA -Select risk for relapse. Encouraged her to maintain her AA involvement 6. Right wrist pain: predominantly de quervain's tenosynovitis.Patient also with right thumb pain and trigger finger   -appears to be stable to improved    -continue splint wear HS and prn during the day -nerve pain related to thalamic injury -continue diclofenac and gabapentin   6 minutes of tele-visit time was spent with this patient today. Follow up in 6 months

## 2019-03-31 ENCOUNTER — Other Ambulatory Visit: Payer: Self-pay | Admitting: Physical Medicine & Rehabilitation

## 2019-03-31 ENCOUNTER — Other Ambulatory Visit: Payer: Self-pay | Admitting: Family Medicine

## 2019-03-31 DIAGNOSIS — F32A Depression, unspecified: Secondary | ICD-10-CM

## 2019-03-31 DIAGNOSIS — I619 Nontraumatic intracerebral hemorrhage, unspecified: Secondary | ICD-10-CM

## 2019-03-31 DIAGNOSIS — G4709 Other insomnia: Secondary | ICD-10-CM

## 2019-03-31 DIAGNOSIS — F329 Major depressive disorder, single episode, unspecified: Secondary | ICD-10-CM

## 2019-04-16 ENCOUNTER — Other Ambulatory Visit: Payer: Self-pay

## 2019-04-16 DIAGNOSIS — I619 Nontraumatic intracerebral hemorrhage, unspecified: Secondary | ICD-10-CM

## 2019-04-16 DIAGNOSIS — G4709 Other insomnia: Secondary | ICD-10-CM

## 2019-04-16 MED ORDER — TRAZODONE HCL 50 MG PO TABS: 50.0000 mg | ORAL_TABLET | Freq: Every evening | ORAL | 0 refills | Status: DC | PRN

## 2019-04-16 NOTE — Telephone Encounter (Signed)
Patient called requesting refills of medication for trazodone and atorvastatin medications.  Refilled trazodone per clinical protocol and forwarded atorvastatin to patients primary care provider, Dr. Charlott Rakes, for primary management and refills.

## 2019-04-29 ENCOUNTER — Other Ambulatory Visit: Payer: Self-pay

## 2019-04-29 DIAGNOSIS — Z20822 Contact with and (suspected) exposure to covid-19: Secondary | ICD-10-CM

## 2019-04-30 LAB — NOVEL CORONAVIRUS, NAA: SARS-CoV-2, NAA: NOT DETECTED

## 2019-05-08 ENCOUNTER — Other Ambulatory Visit: Payer: Self-pay | Admitting: Physical Medicine & Rehabilitation

## 2019-05-08 DIAGNOSIS — L2089 Other atopic dermatitis: Secondary | ICD-10-CM | POA: Diagnosis not present

## 2019-05-08 DIAGNOSIS — I619 Nontraumatic intracerebral hemorrhage, unspecified: Secondary | ICD-10-CM

## 2019-05-12 ENCOUNTER — Other Ambulatory Visit: Payer: Self-pay | Admitting: Family Medicine

## 2019-05-12 DIAGNOSIS — R234 Changes in skin texture: Secondary | ICD-10-CM

## 2019-05-18 ENCOUNTER — Ambulatory Visit
Admission: RE | Admit: 2019-05-18 | Discharge: 2019-05-18 | Disposition: A | Discharge status: Home / Self Care | Payer: Medicaid Other | Source: Ambulatory Visit | Attending: Family Medicine | Admitting: Family Medicine

## 2019-05-18 ENCOUNTER — Ambulatory Visit: Payer: Medicaid Other

## 2019-05-18 ENCOUNTER — Other Ambulatory Visit: Payer: Self-pay

## 2019-05-18 DIAGNOSIS — R928 Other abnormal and inconclusive findings on diagnostic imaging of breast: Secondary | ICD-10-CM | POA: Diagnosis not present

## 2019-05-18 DIAGNOSIS — R234 Changes in skin texture: Secondary | ICD-10-CM

## 2019-07-17 ENCOUNTER — Other Ambulatory Visit: Payer: Self-pay | Admitting: Physical Medicine & Rehabilitation

## 2019-07-17 DIAGNOSIS — I619 Nontraumatic intracerebral hemorrhage, unspecified: Secondary | ICD-10-CM

## 2019-07-17 DIAGNOSIS — Z23 Encounter for immunization: Secondary | ICD-10-CM | POA: Diagnosis not present

## 2019-08-04 ENCOUNTER — Other Ambulatory Visit: Payer: Self-pay | Admitting: Physical Medicine & Rehabilitation

## 2019-08-04 DIAGNOSIS — G4709 Other insomnia: Secondary | ICD-10-CM

## 2019-08-26 ENCOUNTER — Other Ambulatory Visit: Payer: Self-pay | Admitting: Physical Medicine & Rehabilitation

## 2019-08-26 DIAGNOSIS — F419 Anxiety disorder, unspecified: Secondary | ICD-10-CM

## 2019-09-02 ENCOUNTER — Other Ambulatory Visit: Payer: Self-pay

## 2019-09-02 ENCOUNTER — Encounter: Payer: Medicaid Other | Attending: Physical Medicine & Rehabilitation | Admitting: Physical Medicine & Rehabilitation

## 2019-09-02 ENCOUNTER — Encounter: Payer: Self-pay | Admitting: Physical Medicine & Rehabilitation

## 2019-09-02 DIAGNOSIS — I619 Nontraumatic intracerebral hemorrhage, unspecified: Secondary | ICD-10-CM | POA: Diagnosis not present

## 2019-09-02 DIAGNOSIS — I10 Essential (primary) hypertension: Secondary | ICD-10-CM | POA: Diagnosis not present

## 2019-09-02 DIAGNOSIS — F419 Anxiety disorder, unspecified: Secondary | ICD-10-CM | POA: Diagnosis not present

## 2019-09-02 DIAGNOSIS — M6530 Trigger finger, unspecified finger: Secondary | ICD-10-CM | POA: Diagnosis not present

## 2019-09-02 DIAGNOSIS — R202 Paresthesia of skin: Secondary | ICD-10-CM | POA: Insufficient documentation

## 2019-09-02 DIAGNOSIS — F329 Major depressive disorder, single episode, unspecified: Secondary | ICD-10-CM | POA: Diagnosis not present

## 2019-09-02 DIAGNOSIS — M654 Radial styloid tenosynovitis [de Quervain]: Secondary | ICD-10-CM | POA: Insufficient documentation

## 2019-09-02 MED ORDER — DICLOFENAC SODIUM 50 MG PO TBEC: 50.0000 mg | DELAYED_RELEASE_TABLET | Freq: Two times a day (BID) | ORAL | 6 refills | Status: DC

## 2019-09-02 MED ORDER — ATORVASTATIN CALCIUM 10 MG PO TABS: 10.0000 mg | ORAL_TABLET | Freq: Every day | ORAL | 6 refills | Status: DC

## 2019-09-02 MED ORDER — PAROXETINE HCL 20 MG PO TABS: 20.0000 mg | ORAL_TABLET | Freq: Every day | ORAL | 5 refills | Status: DC

## 2019-09-02 MED ORDER — LISINOPRIL 10 MG PO TABS: 10.0000 mg | ORAL_TABLET | Freq: Every day | ORAL | 6 refills | Status: DC

## 2019-09-02 MED ORDER — GABAPENTIN 300 MG PO CAPS: 300.0000 mg | ORAL_CAPSULE | Freq: Three times a day (TID) | ORAL | 5 refills | Status: DC

## 2019-09-02 NOTE — Progress Notes (Signed)
Subjective:    Patient ID: Kelsey Smith, female    DOB: 1961-07-11, 58 y.o.   MRN: [id number]  HPI   Kelsey Smith is back in follow-up of her thalamic infarct and associated deficits and pain.  Last met her about 6 months ago.  She states that she is having more pain in her hand and wrist as well as the right leg, especially the knee.  Incidentally she stopped taking the gabapentin as she felt it was no longer helping.  She did not have her diclofenac refilled either.  I last saw her these both appear to be helping her.  She stays as active as she can at home.  She does do some part-time housecleaning to stay busy and earned a few buttocks.  Otherwise patient denies any new problems.  She states her mood is been pretty positive.  She has for some refills on multiple medications today.  She has not seen a primary for several months.   Pain Inventory Average Pain 6 Pain Right Now 6 My pain is dull, tingling and aching  In the last 24 hours, has pain interfered with the following? General activity 5 Relation with others 3 Enjoyment of life 5 What TIME of day is your pain at its worst? all Sleep (in general) Poor  Pain is worse with: bending, inactivity and standing Pain improves with: medication and injections Relief from Meds: 2  Mobility walk without assistance ability to climb steps?  yes  Function disabled: date disabled .  Neuro/Psych trouble walking depression anxiety  Prior Studies Any changes since last visit?  no  Physicians involved in your care Any changes since last visit?  no   Family History  Problem Relation Age of Onset  . Lung cancer Mother   . Dementia Father   . Brain cancer Brother   . Lung cancer Brother   . Breast cancer Maternal Grandmother   . Breast cancer Paternal Aunt   . Colon cancer Neg Hx    Social History   Socioeconomic History  . Marital status: Single    Spouse name: Not on file  . Number of children: 0  . Years of  education: Not on file  . Highest education level: Not on file  Occupational History  . Occupation: hair stylist  Social Needs  . Financial resource strain: Not on file  . Food insecurity    Worry: Not on file    Inability: Not on file  . Transportation needs    Medical: Not on file    Non-medical: Not on file  Tobacco Use  . Smoking status: Former Smoker    Quit date: 09/24/2016    Years since quitting: 2.9  . Smokeless tobacco: Never Used  Substance and Sexual Activity  . Alcohol use: No    Comment: quit in AA  09-24-2016  . Drug use: No  . Sexual activity: Not on file  Lifestyle  . Physical activity    Days per week: Not on file    Minutes per session: Not on file  . Stress: Not on file  Relationships  . Social Herbalist on phone: Not on file    Gets together: Not on file    Attends religious service: Not on file    Active member of club or organization: Not on file    Attends meetings of clubs or organizations: Not on file    Relationship status: Not on file  Other Topics Concern  .  Not on file  Social History Narrative   Pt doing well, (not doing drugs, alcohol, going to AA .     Past Surgical History:  Procedure Laterality Date  . ELBOW FRACTURE SURGERY Left   . GYNECOLOGIC CRYOSURGERY     Past Medical History:  Diagnosis Date  . Alcoholism ([place])   . Aneurysm, cerebral   . Anxiety   . Arthritis   . Depression   . HLD (hyperlipidemia)   . Hypertension   . Stroke (HCC)    Temp (!) 97.3 F (36.3 C)   Ht '5\' 1"'  (1.549 m)   Wt 145 lb (65.8 kg)   BMI 27.40 kg/m   Opioid Risk Score:   Fall Risk Score:  `1  Depression screen PHQ 2/9  Depression screen [facility] 2/9 09/22/2018 03/05/2018 08/30/2017 04/16/2017 02/28/2017 11/28/2016 11/06/2016  Decreased Interest '1 1 1 1 1 1 ' 0  Down, Depressed, Hopeless '1 1 1 1 1 1 ' 0  PHQ - 2 Score '2 2 2 2 2 2 ' 0  Altered sleeping - 1 3 - 3 2 -  Tired, decreased energy - 1 1 - 1 1 -  Change in appetite - 0 0 - 0 0 -   Feeling bad or failure about yourself  - 1 1 - 1 1 -  Trouble concentrating - 1 2 - 1 2 -  Moving slowly or fidgety/restless - 0 1 - 1 0 -  Suicidal thoughts - 0 0 - 0 0 -  PHQ-9 Score - 6 10 - 9 8 -    Review of Systems  Constitutional: Negative.   HENT: Negative.   Eyes: Negative.   Respiratory: Negative.   Cardiovascular: Negative.   Gastrointestinal: Negative.   Endocrine: Negative.   Genitourinary: Negative.   Musculoskeletal: Positive for arthralgias, gait problem and myalgias.  Skin: Positive for rash.  Allergic/Immunologic: Negative.   Neurological: Positive for tremors, weakness and numbness.       Tingling  Psychiatric/Behavioral: Positive for dysphoric mood. The patient is nervous/anxious.   All other systems reviewed and are negative.      Objective:   Physical Exam   Gen: no distress, normal appearing HEENT: oral mucosa pink and moist, NCAT Cardio: Reg rate Chest: normal effort, normal rate of breathing Abd: soft, non-distended Ext: no edema Skin: intact Neuro: Right sensory loss, mild dysesthesias. Gait stable. Favors right leg still. Cognitively appropriate.  Musculoskeletal: pain at 1st, 2nd PIP, MCP's left > right. Mild effusion right knee, sensitive to touch. Psych: pleasant, normal affect        Assessment & Plan:  1. 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 -continue HEP            -volunteering when feasbile   -Refilled her lisinopril and Lipitor today--recommend that she discuss her Lipitor with her primary doctor at the next visit.. 3. Right and left leg pain--neuropathic component -HEP           -mild OA component   -Encouraged ongoing physical activity as possible at home. 4. Mood:  -on busparandpaxil.             -continue Paxil at 20 mg daily             -Refilled Paxil today 5. Polysubstance abuse: continue AA  -Select risk for relapse. Encouraged her to maintain her AA involvement 6. Bilateral wrist and finger pain: predominantly de quervain's tenosynovitis.                 -  appears to be stable to improved                -continue splint wear HS and prn during the day with activities   -nerve pain related to thalamic injury -resume diclofenac and gabapentin   Fifteen minutes of face to face patient care time were spent during this visit. All questions were encouraged and answered.  Follow up with me in 6 mos .

## 2019-09-02 NOTE — Patient Instructions (Signed)
Maintain regular stretching of your wrists and hands each day.

## 2019-09-03 ENCOUNTER — Other Ambulatory Visit: Payer: Self-pay | Admitting: Physical Medicine & Rehabilitation

## 2019-09-03 DIAGNOSIS — G4709 Other insomnia: Secondary | ICD-10-CM

## 2019-09-04 NOTE — Telephone Encounter (Signed)
Received a refill request for Trazodone, no mention in last office if patient can continue medication. Is this okay to refill?

## 2019-09-14 DIAGNOSIS — H16223 Keratoconjunctivitis sicca, not specified as Sjogren's, bilateral: Secondary | ICD-10-CM | POA: Diagnosis not present

## 2019-09-14 DIAGNOSIS — H40033 Anatomical narrow angle, bilateral: Secondary | ICD-10-CM | POA: Diagnosis not present

## 2019-09-21 DIAGNOSIS — Z23 Encounter for immunization: Secondary | ICD-10-CM | POA: Diagnosis not present

## 2019-10-21 DIAGNOSIS — H43393 Other vitreous opacities, bilateral: Secondary | ICD-10-CM | POA: Diagnosis not present

## 2019-11-20 DIAGNOSIS — H1013 Acute atopic conjunctivitis, bilateral: Secondary | ICD-10-CM | POA: Diagnosis not present

## 2020-01-05 DIAGNOSIS — Z23 Encounter for immunization: Secondary | ICD-10-CM | POA: Diagnosis not present

## 2020-01-26 DIAGNOSIS — Z23 Encounter for immunization: Secondary | ICD-10-CM | POA: Diagnosis not present

## 2020-03-02 ENCOUNTER — Encounter: Payer: Medicaid Other | Attending: Physical Medicine & Rehabilitation | Admitting: Physical Medicine & Rehabilitation

## 2020-03-02 ENCOUNTER — Other Ambulatory Visit: Payer: Self-pay

## 2020-03-02 ENCOUNTER — Encounter: Payer: Self-pay | Admitting: Physical Medicine & Rehabilitation

## 2020-03-02 VITALS — BP 152/80 | HR 62 | Temp 97.3°F | Ht 60.0 in | Wt 139.0 lb

## 2020-03-02 DIAGNOSIS — M654 Radial styloid tenosynovitis [de Quervain]: Secondary | ICD-10-CM | POA: Diagnosis not present

## 2020-03-02 DIAGNOSIS — I619 Nontraumatic intracerebral hemorrhage, unspecified: Secondary | ICD-10-CM

## 2020-03-02 DIAGNOSIS — M6530 Trigger finger, unspecified finger: Secondary | ICD-10-CM

## 2020-03-02 NOTE — Progress Notes (Signed)
Subjective:    Patient ID: Kelsey Smith, female    DOB: 07-Jul-1961, 59 y.o.   MRN: [id number]  HPI   Kelsey Smith is here in follow up of her left thalamic hemorrhage.  I last saw her about 6 months ago.  For the most part she has been doing fairly well.  Her right hand and wrist seem to be stable from a pain standpoint.  She will use an occasional Tylenol.  Her left hip and low back sometimes give her trouble but she admittedly is not stretching daily nor walking all the time.  She is doing some odds and work.  She is no longer going to her AA meetings however.  Her mood has been good.  She has a friend that she spends a lot of time with who helps her from an emotional and social standpoint.  She had questions about her aneurysm and whether she needed to follow-up.  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  She had no symptoms to cause concern.  She does have a headache once in a while and always has in the back of her mind whether this could be related to the aneurysm.   Pain Inventory Average Pain 4 Pain Right Now 4 My pain is dull and aching  In the last 24 hours, has pain interfered with the following? General activity 1 Relation with others 1 Enjoyment of life 1 What TIME of day is your pain at its worst? morning Sleep (in general) Fair  Pain is worse with: walking Pain improves with: medication Relief from Meds: 5  Mobility walk without assistance how many minutes can you walk? 20 ability to climb steps?  yes do you drive?  yes  Function not employed: date last employed 2018  Neuro/Psych depression anxiety  Prior Studies Any changes since last visit?  no  Physicians involved in your care Any changes since last visit?  no   Family History  Problem Relation Age of Onset  . Lung cancer Mother   . Dementia Father   . Brain cancer Brother   . Lung cancer Brother   . Breast cancer Maternal  Grandmother   . Breast cancer Paternal Aunt   . Colon cancer Neg Hx    Social History   Socioeconomic History  . Marital status: Single    Spouse name: Not on file  . Number of children: 0  . Years of education: Not on file  . Highest education level: Not on file  Occupational History  . Occupation: hair stylist  Tobacco Use  . Smoking status: Former Smoker    Quit date: 09/24/2016    Years since quitting: 3.4  . Smokeless tobacco: Never Used  Substance and Sexual Activity  . Alcohol use: No    Comment: quit in AA  09-24-2016  . Drug use: No  . Sexual activity: Not on file  Other Topics Concern  . Not on file  Social History Narrative   Pt doing well, (not doing drugs, alcohol, going to AA .     Social Determinants of Health   Financial Resource Strain:   . Difficulty of Paying Living Expenses:   Food Insecurity:   . Worried About Programme researcher, broadcasting/film/video in the Last Year:   . Barista in the Last Year:   Transportation Needs:   . Freight forwarder (Medical):   Marland Kitchen Lack of Transportation (  Non-Medical):   Physical Activity:   . Days of Exercise per Week:   . Minutes of Exercise per Session:   Stress:   . Feeling of Stress :   Social Connections:   . Frequency of Communication with Friends and Family:   . Frequency of Social Gatherings with Friends and Family:   . Attends Religious Services:   . Active Member of Clubs or Organizations:   . Attends Archivist Meetings:   Marland Kitchen Marital Status:    Past Surgical History:  Procedure Laterality Date  . ELBOW FRACTURE SURGERY Left   . GYNECOLOGIC CRYOSURGERY     Past Medical History:  Diagnosis Date  . Alcoholism ([place])   . Aneurysm, cerebral   . Anxiety   . Arthritis   . Depression   . HLD (hyperlipidemia)   . Hypertension   . Stroke ([place])    BP (!) 152/80   Pulse 62   Temp (!) 97.3 F (36.3 C)   Ht 5' (1.524 m)   Wt 139 lb (63 kg)   SpO2 98%   BMI 27.15 kg/m   Opioid Risk Score:   Fall  Risk Score:  `1  Depression screen PHQ 2/9  Depression screen [facility] 2/9 09/22/2018 03/05/2018 08/30/2017 04/16/2017 02/28/2017 11/28/2016 11/06/2016  Decreased Interest 1 1 1 1 1 1  0  Down, Depressed, Hopeless 1 1 1 1 1 1  0  PHQ - 2 Score 2 2 2 2 2 2  0  Altered sleeping - 1 3 - 3 2 -  Tired, decreased energy - 1 1 - 1 1 -  Change in appetite - 0 0 - 0 0 -  Feeling bad or failure about yourself  - 1 1 - 1 1 -  Trouble concentrating - 1 2 - 1 2 -  Moving slowly or fidgety/restless - 0 1 - 1 0 -  Suicidal thoughts - 0 0 - 0 0 -  PHQ-9 Score - 6 10 - 9 8 -    Review of Systems  Constitutional: Negative.   HENT: Negative.   Eyes: Negative.   Respiratory: Negative.   Cardiovascular: Negative.   Gastrointestinal: Negative.   Endocrine: Negative.   Genitourinary: Negative.   Musculoskeletal: Negative.   Skin: Negative.   Neurological: Negative.   Hematological: Negative.   Psychiatric/Behavioral: Positive for dysphoric mood. The patient is nervous/anxious.   All other systems reviewed and are negative.      Objective:   Physical Exam  General: No acute distress HEENT: EOMI, oral membranes moist Cards: reg rate  Chest: normal effort Abdomen: Soft, NT, ND Skin: dry, intact Extremities: no edema Psych: pleasant and cooperative Neuro: Right sided sensory loss ongoing.  Walks with a slightly wide-based gait favoring the right side. Musculoskeletal:  Mild pain at 1st, 2nd PIP, MCP's left > right. Mild effusion right knee, which remains somewhat sensitive to touch.             Assessment & Plan:  1.  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             -continue HEP. 3. Right  and left leg pain--neuropathic component             -HEP             -mild OA component               -Encouraged ongoing physical activity as possible at home. Sometimes  she's not as regular with her exercise as she should 4. Mood:               -on buspar and paxil.                -continue Paxil at 20 mg daily             -No refills required today.   5. Polysubstance abuse: Encouraged her to resume going to her AA meetings 6. Bilateral wrist and finger pain: predominantly de quervain's tenosynovitis.                   -pain and ROM appear stable/improved   -immobilization with splint if flares, ice               -nerve pain related to thalamic injury                               15 minutes of face to face patient care time were spent during this visit. All questions were encouraged and answered.  Follow up with me in 1 year.         q

## 2020-03-02 NOTE — Patient Instructions (Signed)
Contact [facility] to ask if they recommend another follow up scan of your brain to assess your aneurysm. Last was 2019.

## 2020-05-05 ENCOUNTER — Other Ambulatory Visit: Payer: Self-pay | Admitting: Physical Medicine & Rehabilitation

## 2020-05-05 DIAGNOSIS — I619 Nontraumatic intracerebral hemorrhage, unspecified: Secondary | ICD-10-CM

## 2020-05-05 DIAGNOSIS — I10 Essential (primary) hypertension: Secondary | ICD-10-CM

## 2020-05-05 DIAGNOSIS — F32A Depression, unspecified: Secondary | ICD-10-CM

## 2020-05-05 DIAGNOSIS — F419 Anxiety disorder, unspecified: Secondary | ICD-10-CM

## 2020-05-05 DIAGNOSIS — F329 Major depressive disorder, single episode, unspecified: Secondary | ICD-10-CM

## 2020-06-16 ENCOUNTER — Other Ambulatory Visit: Payer: Self-pay | Admitting: Physical Medicine & Rehabilitation

## 2020-06-16 DIAGNOSIS — I10 Essential (primary) hypertension: Secondary | ICD-10-CM

## 2020-06-16 DIAGNOSIS — F419 Anxiety disorder, unspecified: Secondary | ICD-10-CM

## 2020-06-21 ENCOUNTER — Other Ambulatory Visit: Payer: Self-pay | Admitting: Physical Medicine & Rehabilitation

## 2020-06-21 DIAGNOSIS — I619 Nontraumatic intracerebral hemorrhage, unspecified: Secondary | ICD-10-CM

## 2020-07-25 ENCOUNTER — Other Ambulatory Visit: Payer: Self-pay | Admitting: Physical Medicine & Rehabilitation

## 2020-07-25 DIAGNOSIS — F32A Depression, unspecified: Secondary | ICD-10-CM

## 2020-07-25 DIAGNOSIS — F419 Anxiety disorder, unspecified: Secondary | ICD-10-CM

## 2020-08-27 ENCOUNTER — Other Ambulatory Visit: Payer: Self-pay | Admitting: Physical Medicine & Rehabilitation

## 2020-08-27 DIAGNOSIS — F32A Depression, unspecified: Secondary | ICD-10-CM

## 2020-08-27 DIAGNOSIS — F419 Anxiety disorder, unspecified: Secondary | ICD-10-CM

## 2020-10-04 DIAGNOSIS — G8929 Other chronic pain: Secondary | ICD-10-CM | POA: Insufficient documentation

## 2020-10-04 DIAGNOSIS — M545 Low back pain, unspecified: Secondary | ICD-10-CM | POA: Diagnosis not present

## 2020-10-13 ENCOUNTER — Other Ambulatory Visit: Payer: Self-pay | Admitting: Physical Medicine & Rehabilitation

## 2020-10-13 DIAGNOSIS — I10 Essential (primary) hypertension: Secondary | ICD-10-CM

## 2020-10-14 DIAGNOSIS — M545 Low back pain, unspecified: Secondary | ICD-10-CM | POA: Diagnosis not present

## 2020-10-17 DIAGNOSIS — M545 Low back pain, unspecified: Secondary | ICD-10-CM | POA: Diagnosis not present

## 2020-10-19 DIAGNOSIS — M545 Low back pain, unspecified: Secondary | ICD-10-CM | POA: Diagnosis not present

## 2020-10-28 DIAGNOSIS — M545 Low back pain, unspecified: Secondary | ICD-10-CM | POA: Diagnosis not present

## 2020-11-03 DIAGNOSIS — M545 Low back pain, unspecified: Secondary | ICD-10-CM | POA: Diagnosis not present

## 2020-11-06 ENCOUNTER — Other Ambulatory Visit: Payer: Self-pay | Admitting: Physical Medicine & Rehabilitation

## 2020-11-06 DIAGNOSIS — I619 Nontraumatic intracerebral hemorrhage, unspecified: Secondary | ICD-10-CM

## 2020-12-01 ENCOUNTER — Other Ambulatory Visit: Payer: Self-pay | Admitting: Physical Medicine & Rehabilitation

## 2020-12-01 DIAGNOSIS — I10 Essential (primary) hypertension: Secondary | ICD-10-CM

## 2021-02-17 ENCOUNTER — Other Ambulatory Visit: Payer: Self-pay

## 2021-02-17 ENCOUNTER — Ambulatory Visit
Admission: EM | Admit: 2021-02-17 | Discharge: 2021-02-17 | Disposition: A | Discharge status: Home / Self Care | Payer: Medicaid Other | Attending: Emergency Medicine | Admitting: Emergency Medicine

## 2021-02-17 DIAGNOSIS — K047 Periapical abscess without sinus: Secondary | ICD-10-CM | POA: Diagnosis not present

## 2021-02-17 MED ORDER — HYDROCODONE-ACETAMINOPHEN 5-325 MG PO TABS: 1.0000 | ORAL_TABLET | Freq: Four times a day (QID) | ORAL | 0 refills | Status: DC | PRN

## 2021-02-17 MED ORDER — CHLORHEXIDINE GLUCONATE 0.12 % MT SOLN: 15.0000 mL | Freq: Two times a day (BID) | OROMUCOSAL | 0 refills | Status: AC

## 2021-02-17 MED ORDER — CLINDAMYCIN HCL 300 MG PO CAPS: 300.0000 mg | ORAL_CAPSULE | Freq: Three times a day (TID) | ORAL | 0 refills | Status: AC

## 2021-02-17 NOTE — Discharge Instructions (Signed)
Begin clindamycin every 8 hours x1 week Tylenol 1000 mg every 4-6 hours May use hydrocodone for severe pain Warm compresses to face Chlorhexidine rinse twice daily Follow-up in emergency room if symptoms progressing or worsening

## 2021-02-17 NOTE — ED Triage Notes (Signed)
Pt present dental pain on the right side with swelling and abscess on her gum.  Pt states having trouble chewing and swallowing. Pt states the abscess is draining in side her mouth

## 2021-02-17 NOTE — ED Provider Notes (Signed)
[facility]    CSN: [id number] Arrival date & time: 02/17/21  1447      History   Chief Complaint Chief Complaint  Patient presents with  . Dental Pain    HPI Kelsey Smith is a 60 y.o. female history of hypertension, prior CVA, presenting today for evaluation of dental pain and swelling.  Reports over the past 3 days she has developed increased pain and swelling to her right upper drawl.  Reports pain with swallowing and chewing, decreased oral intake of recently.  Denies any neck pain or stiffness.  Denies known fevers.  HPI  Past Medical History:  Diagnosis Date  . Alcoholism (HCC)   . Aneurysm, cerebral   . Anxiety   . Arthritis   . Depression   . HLD (hyperlipidemia)   . Hypertension   . Stroke [facility])     Patient Active Problem List   Diagnosis Date Noted  . Prolonged Q-T interval on ECG 03/05/2018  . Paresthesia 11/29/2017  . Hyperlipidemia 10/21/2017  . Overactive bladder 08/30/2017  . Trigger finger, acquired 08/21/2017  . De Quervain's tenosynovitis, right 04/16/2017  . Carpal tunnel syndrome 02/28/2017  . Insomnia 02/28/2017  . Anxiety and depression 02/28/2017  . Benign essential HTN   . Right hemiparesis (HCC)   . Slow transit constipation   . Lethargy   . IVH (intraventricular hemorrhage) (HCC) 09/26/2016  . B12 deficiency 09/26/2016  . Hypertensive emergency 09/26/2016  . Essential hypertension 09/26/2016  . Carotid aneurysm, left (HCC) 09/26/2016  . Open thigh wound 09/26/2016  . Somnolence   . ETOH abuse   . Tobacco abuse   . Cocaine abuse (HCC)   . Pure hypercholesterolemia   . 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 ELBOW FRACTURE SURGERY Left   . GYNECOLOGIC CRYOSURGERY      OB History   No obstetric history on file.      Home Medications    Prior to Admission medications   Medication Sig Start Date End Date Taking?  Authorizing Provider  atorvastatin (LIPITOR) 10 MG tablet Take 1 tablet by mouth once daily 11/08/20   Ranelle Oyster, MD  chlorhexidine (PERIDEX) 0.12 % solution Use as directed 15 mLs in the mouth or throat 2 (two) times daily. 02/17/21  Yes Kinta Martis C, PA-C  clindamycin (CLEOCIN) 300 MG capsule Take 1 capsule (300 mg total) by mouth 3 (three) times daily for 7 days. 02/17/21 02/24/21 Yes Jatara Huettner C, PA-C  HYDROcodone-acetaminophen (NORCO/VICODIN) 5-325 MG tablet Take 1-2 tablets by mouth every 6 (six) hours as needed for severe pain. 02/17/21  Yes Brach Birdsall C, PA-C  busPIRone (BUSPAR) 10 MG tablet Take 1 tablet (10 mg total) by mouth 2 (two) times daily. 03/05/18   Hoy Register, MD  cyanocobalamin 1000 MCG tablet Take 1 tablet (1,000 mcg total) by mouth daily. 01/07/17   Ranelle Oyster, MD  gabapentin (NEURONTIN) 300 MG capsule Take 1 capsule (300 mg total) by mouth 3 (three) times daily. 09/02/19   Ranelle Oyster, MD  lisinopril (ZESTRIL) 10 MG tablet Take 1 tablet by mouth once daily 12/05/20   Ranelle Oyster, MD  Multiple Vitamin (MULTIVITAMIN) tablet Take 1 tablet by mouth daily.    [provider]  oxybutynin (DITROPAN) 5 MG tablet Take 1 tablet (5 mg total) by mouth 2 (two) times daily. 03/05/18   Hoy Register, MD  PARoxetine (PAXIL)  20 MG tablet Take 1 tablet by mouth once daily 08/29/20   Ranelle Oyster, MD  traZODone (DESYREL) 50 MG tablet TAKE 1 TABLET BY MOUTH EVERY DAY AT BEDTIME AS NEEDED FOR SLEEP . APPOINTMENT REQUIRED FOR FUTURE REFILLS 09/05/19   Ranelle Oyster, MD    Family History Family History  Problem Relation Age of Onset  . Lung cancer Mother   . Dementia Father   . Brain cancer Brother   . Lung cancer Brother   . Breast cancer Maternal Grandmother   . Breast cancer Paternal Aunt   . Colon cancer Neg Hx     Social History Social History   Tobacco Use  . Smoking status: Former Smoker    Quit date: 09/24/2016    Years  since quitting: 4.4  . Smokeless tobacco: Never Used  Vaping Use  . Vaping Use: Never used  Substance Use Topics  . Alcohol use: No    Comment: quit in AA  09-24-2016  . Drug use: No     Allergies   Patient has no known allergies.   Review of Systems Review of Systems  Constitutional: Negative for activity change, appetite change, chills, fatigue and fever.  HENT: Positive for dental problem and facial swelling. Negative for congestion, ear pain, rhinorrhea, sinus pressure, sore throat and trouble swallowing.   Eyes: Negative for discharge and redness.  Respiratory: Negative for cough, chest tightness and shortness of breath.   Cardiovascular: Negative for chest pain.  Gastrointestinal: Negative for abdominal pain, diarrhea, nausea and vomiting.  Musculoskeletal: Negative for myalgias.  Skin: Negative for rash.  Neurological: Negative for dizziness, light-headedness and headaches.     Physical Exam Triage Vital Signs ED Triage Vitals  Enc Vitals Group     BP 02/17/21 1539 139/82     Pulse Rate 02/17/21 1539 72     Resp 02/17/21 1539 16     Temp 02/17/21 1539 97.8 F (36.6 C)     Temp Source 02/17/21 1539 Oral     SpO2 02/17/21 1539 97 %     Weight --      Height --      Head Circumference --      Peak Flow --      Pain Score 02/17/21 1537 10     Pain Loc --      Pain Edu? --      Excl. in GC? --    No data found.  Updated Vital Signs BP 139/82 (BP Location: Left Arm)   Pulse 72   Temp 97.8 F (36.6 C) (Oral)   Resp 16   SpO2 97%   Visual Acuity Right Eye Distance:   Left Eye Distance:   Bilateral Distance:    Right Eye Near:   Left Eye Near:    Bilateral Near:     Physical Exam Vitals and nursing note reviewed.  Constitutional:      Appearance: She is well-developed.     Comments: No acute distress  HENT:     Head: Normocephalic and atraumatic.     Comments: Moderate facial swelling noted over right maxillary area with associated induration and  tenderness, no obvious erythema    Nose: Nose normal.     Mouth/Throat:     Comments: Gingival swelling and tenderness noted within right upper jaw Eyes:     Conjunctiva/sclera: Conjunctivae normal.  Cardiovascular:     Rate and Rhythm: Normal rate.  Pulmonary:     Effort: Pulmonary effort is  normal. No respiratory distress.  Abdominal:     General: There is no distension.  Musculoskeletal:        General: Normal range of motion.     Cervical back: Neck supple.  Skin:    General: Skin is warm and dry.  Neurological:     Mental Status: She is alert and oriented to person, place, and time.      UC Treatments / Results  Labs (all labs ordered are listed, but only abnormal results are displayed) Labs Reviewed - No data to display  EKG   Radiology No results found.  Procedures Procedures (including critical care time)  Medications Ordered in UC Medications - No data to display  Initial Impression / Assessment and Plan / UC Course  I have reviewed the triage vital signs and the nursing notes.  Pertinent labs & imaging results that were available during my care of the patient were reviewed by me and considered in my medical decision making (see chart for details).     Dental abscess- initiating on clindamycin, continue Tylenol, patient instructed to avoid NSAIDs due to aneurysm present, will provide hydrocodone for more severe pain, warm compresses and chlorhexidine rinse.  Continue to monitor, patient advised to follow-up in emergency room if not seeing any improvement over the next 48 hours with oral antibiotics.  Discussed strict return precautions. Patient verbalized understanding and is agreeable with plan.  Final Clinical Impressions(s) / UC Diagnoses   Final diagnoses:  Dental abscess     Discharge Instructions     Begin clindamycin every 8 hours x1 week Tylenol 1000 mg every 4-6 hours May use hydrocodone for severe pain Warm compresses to  face Chlorhexidine rinse twice daily Follow-up in emergency room if symptoms progressing or worsening    ED Prescriptions    Medication Sig Dispense Auth. Provider   clindamycin (CLEOCIN) 300 MG capsule Take 1 capsule (300 mg total) by mouth 3 (three) times daily for 7 days. 21 capsule Shunda Rabadi C, PA-C   HYDROcodone-acetaminophen (NORCO/VICODIN) 5-325 MG tablet Take 1-2 tablets by mouth every 6 (six) hours as needed for severe pain. 10 tablet Tamla Winkels C, PA-C   chlorhexidine (PERIDEX) 0.12 % solution Use as directed 15 mLs in the mouth or throat 2 (two) times daily. 120 mL Willow Shidler, [place] C, PA-C     I have reviewed the PDMP during this encounter.   Sharyon Cable [place] C, PA-C 02/17/21 1622

## 2021-03-01 ENCOUNTER — Ambulatory Visit: Payer: Medicaid Other | Admitting: Physical Medicine & Rehabilitation

## 2021-03-01 ENCOUNTER — Encounter: Payer: Medicaid Other | Attending: Physical Medicine & Rehabilitation | Admitting: Physical Medicine & Rehabilitation

## 2021-03-01 ENCOUNTER — Other Ambulatory Visit: Payer: Self-pay

## 2021-03-01 ENCOUNTER — Encounter: Payer: Self-pay | Admitting: Physical Medicine & Rehabilitation

## 2021-03-01 VITALS — BP 122/83 | HR 87 | Temp 98.5°F | Ht 60.0 in | Wt 132.0 lb

## 2021-03-01 DIAGNOSIS — F32A Depression, unspecified: Secondary | ICD-10-CM | POA: Diagnosis not present

## 2021-03-01 DIAGNOSIS — I10 Essential (primary) hypertension: Secondary | ICD-10-CM | POA: Diagnosis not present

## 2021-03-01 DIAGNOSIS — G8191 Hemiplegia, unspecified affecting right dominant side: Secondary | ICD-10-CM | POA: Diagnosis not present

## 2021-03-01 DIAGNOSIS — F419 Anxiety disorder, unspecified: Secondary | ICD-10-CM | POA: Diagnosis not present

## 2021-03-01 MED ORDER — PAROXETINE HCL 30 MG PO TABS: 30.0000 mg | ORAL_TABLET | Freq: Every day | ORAL | 5 refills | Status: DC

## 2021-03-01 NOTE — Progress Notes (Signed)
Subjective:    Patient ID: Kelsey Smith, female    DOB: 27-Mar-1961, 60 y.o.   MRN: [id number]  HPI   Kelsey is here in follow up of her left thalamic hemorrhage. She just was in urgent care for an abscessed tooth. She is just finishing abx, rinse etc.   From a mood standpoint she's been experiencing a little more anxiety. She does work on Brewing technologist.  She stays busy cleaning houses. She went to PT for low back pain which helped a bit. She's on a HEP. l  Pain Inventory Average Pain 6 Pain Right Now 6 My pain is dull and aching  LOCATION OF PAIN  Wrist, hand, fingers, back, knee  BOWEL Number of stools per week: 5 Oral laxative use No  Type of laxative n/a Enema or suppository use No  History of colostomy No  Incontinent No   BLADDER Normal In and out cath, frequency n/a Able to self cath n/a Bladder incontinence No  Frequent urination No  Leakage with coughing Yes  Difficulty starting stream No  Incomplete bladder emptying No    Mobility walk without assistance ability to climb steps?  yes do you drive?  yes  Function disabled: date disabled .  Neuro/Psych trouble walking depression  Prior Studies Any changes since last visit?  no  Physicians involved in your care Any changes since last visit?  no   Family History  Problem Relation Age of Onset  . Lung cancer Mother   . Dementia Father   . Brain cancer Brother   . Lung cancer Brother   . Breast cancer Maternal Grandmother   . Breast cancer Paternal Aunt   . Colon cancer Neg Hx    Social History   Socioeconomic History  . Marital status: Single    Spouse name: Not on file  . Number of children: 0  . Years of education: Not on file  . Highest education level: Not on file  Occupational History  . Occupation: hair stylist  Tobacco Use  . Smoking status: Former Smoker    Quit date: 09/24/2016    Years since quitting: 4.4  . Smokeless tobacco: Never Used  Vaping Use  . Vaping  Use: Never used  Substance and Sexual Activity  . Alcohol use: No    Comment: quit in AA  09-24-2016  . Drug use: No  . Sexual activity: Not on file  Other Topics Concern  . Not on file  Social History Narrative   Pt doing well, (not doing drugs, alcohol, going to AA .     Social Determinants of Health   Financial Resource Strain: Not on file  Food Insecurity: Not on file  Transportation Needs: Not on file  Physical Activity: Not on file  Stress: Not on file  Social Connections: Not on file   Past Surgical History:  Procedure Laterality Date  . ELBOW FRACTURE SURGERY Left   . GYNECOLOGIC CRYOSURGERY     Past Medical History:  Diagnosis Date  . Alcoholism (HCC)   . Aneurysm, cerebral   . Anxiety   . Arthritis   . Depression   . HLD (hyperlipidemia)   . Hypertension   . Stroke (HCC)    BP 122/83   Pulse 87   Temp 98.5 F (36.9 C)   Ht 5' (1.524 m)   Wt 132 lb (59.9 kg)   SpO2 97%   BMI 25.78 kg/m   Opioid Risk Score:   Fall Risk Score:  `  1  Depression screen PHQ 2/9  Depression screen [facility] 2/9 09/22/2018 03/05/2018 08/30/2017 04/16/2017 02/28/2017 11/28/2016 11/06/2016  Decreased Interest 1 1 1 1 1 1  0  Down, Depressed, Hopeless 1 1 1 1 1 1  0  PHQ - 2 Score 2 2 2 2 2 2  0  Altered sleeping - 1 3 - 3 2 -  Tired, decreased energy - 1 1 - 1 1 -  Change in appetite - 0 0 - 0 0 -  Feeling bad or failure about yourself  - 1 1 - 1 1 -  Trouble concentrating - 1 2 - 1 2 -  Moving slowly or fidgety/restless - 0 1 - 1 0 -  Suicidal thoughts - 0 0 - 0 0 -  PHQ-9 Score - 6 10 - 9 8 -    Review of Systems  Constitutional: Negative.   HENT: Negative.   Eyes: Negative.   Respiratory: Negative.   Cardiovascular: Negative.   Gastrointestinal: Negative.   Endocrine: Negative.   Genitourinary: Negative.   Musculoskeletal: Positive for back pain and gait problem.  Skin: Negative.   Allergic/Immunologic: Negative.   Hematological: Negative.   Psychiatric/Behavioral:  Positive for dysphoric mood.  All other systems reviewed and are negative.      Objective:   Physical Exam  General: No acute distress HEENT: EOMI, oral membranes moist Cards: reg rate  Chest: normal effort Abdomen: Soft, NT, ND Skin: dry, intact Extremities: no edema Psych: pleasant and appropriate Psych: pleasant and cooperative Neuro: Right sided sensory loss ongoing.  gait improved.  Musculoskeletal:  mild levoscoliosis upper lumbar and low thoracic spine. Tends to lean a little bit to right.              Assessment & Plan:  1.  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             -continue HEP.   -work on 3. Right  and left leg pain--neuropathic component             -HEP             -mild OA component               -Encouraged ongoing physical activity as possible at home. Sometimes she's not as regular with her exercise as she should 4. Mood:              -increase Paxil to 30 mg daily             -situational mgt of mood   5. Polysubstance abuse: Encouraged her to resume going to her AA meetings 6. Bilateral wrist and finger pain: predominantly de quervain's tenosynovitis.                   -mostly improved               -nerve pain related to thalamic injury                               15 minutes of face to face patient care time were spent during this visit. All questions were encouraged and answered.  Follow up with me in 1 year.

## 2021-03-01 NOTE — Patient Instructions (Signed)
PLEASE FEEL FREE TO CALL OUR OFFICE WITH ANY PROBLEMS OR QUESTIONS ([phone])      

## 2021-03-28 ENCOUNTER — Other Ambulatory Visit: Payer: Self-pay | Admitting: Physical Medicine & Rehabilitation

## 2021-03-28 DIAGNOSIS — I619 Nontraumatic intracerebral hemorrhage, unspecified: Secondary | ICD-10-CM

## 2021-04-03 ENCOUNTER — Other Ambulatory Visit: Payer: Self-pay | Admitting: Physical Medicine & Rehabilitation

## 2021-04-03 DIAGNOSIS — I619 Nontraumatic intracerebral hemorrhage, unspecified: Secondary | ICD-10-CM

## 2021-04-05 ENCOUNTER — Telehealth: Payer: Self-pay

## 2021-04-05 DIAGNOSIS — I10 Essential (primary) hypertension: Secondary | ICD-10-CM

## 2021-04-05 DIAGNOSIS — I619 Nontraumatic intracerebral hemorrhage, unspecified: Secondary | ICD-10-CM

## 2021-04-05 MED ORDER — LISINOPRIL 10 MG PO TABS: 10.0000 mg | ORAL_TABLET | Freq: Every day | ORAL | 7 refills | Status: DC

## 2021-04-05 MED ORDER — ATORVASTATIN CALCIUM 10 MG PO TABS: 10.0000 mg | ORAL_TABLET | Freq: Every day | ORAL | 7 refills | Status: DC

## 2021-04-05 NOTE — Telephone Encounter (Signed)
Rx'es filled 

## 2021-04-05 NOTE — Telephone Encounter (Signed)
Pt Kelsey Smith needs refill on her atorvastatin , and lisinopril . She was just here on 03/01/2021 , and does not have a follow up until next year , are we able to fill these for her? Thank you

## 2021-05-03 ENCOUNTER — Other Ambulatory Visit: Payer: Self-pay | Admitting: Nurse Practitioner

## 2021-05-03 DIAGNOSIS — Z1231 Encounter for screening mammogram for malignant neoplasm of breast: Secondary | ICD-10-CM

## 2021-06-01 ENCOUNTER — Other Ambulatory Visit: Payer: Self-pay

## 2021-06-01 ENCOUNTER — Encounter: Payer: Self-pay | Admitting: Family Medicine

## 2021-06-01 ENCOUNTER — Ambulatory Visit (INDEPENDENT_AMBULATORY_CARE_PROVIDER_SITE_OTHER): Payer: Medicaid Other | Admitting: Family Medicine

## 2021-06-01 VITALS — BP 150/68 | HR 76 | Temp 98.1°F | Resp 16 | Ht 62.0 in | Wt 136.6 lb

## 2021-06-01 DIAGNOSIS — F419 Anxiety disorder, unspecified: Secondary | ICD-10-CM

## 2021-06-01 DIAGNOSIS — F32A Depression, unspecified: Secondary | ICD-10-CM | POA: Diagnosis not present

## 2021-06-01 DIAGNOSIS — I10 Essential (primary) hypertension: Secondary | ICD-10-CM

## 2021-06-01 DIAGNOSIS — Z7689 Persons encountering health services in other specified circumstances: Secondary | ICD-10-CM

## 2021-06-01 DIAGNOSIS — R21 Rash and other nonspecific skin eruption: Secondary | ICD-10-CM | POA: Diagnosis not present

## 2021-06-01 DIAGNOSIS — Z23 Encounter for immunization: Secondary | ICD-10-CM | POA: Diagnosis not present

## 2021-06-01 DIAGNOSIS — G4709 Other insomnia: Secondary | ICD-10-CM | POA: Diagnosis not present

## 2021-06-01 DIAGNOSIS — R202 Paresthesia of skin: Secondary | ICD-10-CM

## 2021-06-01 DIAGNOSIS — E785 Hyperlipidemia, unspecified: Secondary | ICD-10-CM

## 2021-06-01 MED ORDER — GABAPENTIN 300 MG PO CAPS: 300.0000 mg | ORAL_CAPSULE | Freq: Three times a day (TID) | ORAL | 2 refills | Status: AC

## 2021-06-01 MED ORDER — LISINOPRIL 20 MG PO TABS: 20.0000 mg | ORAL_TABLET | Freq: Every day | ORAL | 0 refills | Status: DC

## 2021-06-01 MED ORDER — KETOCONAZOLE 2 % EX CREA: 1.0000 "application " | TOPICAL_CREAM | Freq: Every day | CUTANEOUS | 0 refills | Status: DC

## 2021-06-01 MED ORDER — PAROXETINE HCL 40 MG PO TABS: 40.0000 mg | ORAL_TABLET | ORAL | 0 refills | Status: DC

## 2021-06-01 MED ORDER — KETOCONAZOLE 200 MG PO TABS: 400.0000 mg | ORAL_TABLET | Freq: Once | ORAL | 0 refills | Status: AC

## 2021-06-01 MED ORDER — TRAZODONE HCL 50 MG PO TABS: ORAL_TABLET | ORAL | 0 refills | Status: DC

## 2021-06-01 NOTE — Progress Notes (Signed)
Patient is here to est care. Patient would like to talk about having rings on here body. Patient would like to talk about medication change/add

## 2021-06-02 NOTE — Progress Notes (Signed)
New Patient Office Visit  Subjective:  Patient ID: Kelsey Smith, female    DOB: 01/07/1961  Age: 60 y.o. MRN: [id number]  CC:  Chief Complaint  Patient presents with   Establish Care    HPI Kelsey L Corbit presents for to establish care and for review of chronic medical issues.  These issues include insomnia and hypertension.  Past Medical History:  Diagnosis Date   Alcoholism (HCC)    Aneurysm, cerebral    Anxiety    Arthritis    Depression    HLD (hyperlipidemia)    Hypertension    Stroke [facility])     Past Surgical History:  Procedure Laterality Date   ELBOW FRACTURE SURGERY Left    GYNECOLOGIC CRYOSURGERY      Family History  Problem Relation Age of Onset   Lung cancer Mother    Dementia Father    Brain cancer Brother    Lung cancer Brother    Breast cancer Maternal Grandmother    Breast cancer Paternal Aunt    Colon cancer Neg Hx     Social History   Socioeconomic History   Marital status: Single    Spouse name: Not on file   Number of children: 0   Years of education: Not on file   Highest education level: Not on file  Occupational History   Occupation: hair stylist  Tobacco Use   Smoking status: Former    Types: Cigarettes    Quit date: 09/24/2016    Years since quitting: 4.6   Smokeless tobacco: Never  Vaping Use   Vaping Use: Never used  Substance and Sexual Activity   Alcohol use: No    Comment: quit in AA  09-24-2016   Drug use: No   Sexual activity: Not on file  Other Topics Concern   Not on file  Social History Narrative   Pt doing well, (not doing drugs, alcohol, going to AA .     Social Determinants of Health   Financial Resource Strain: Not on file  Food Insecurity: Not on file  Transportation Needs: Not on file  Physical Activity: Not on file  Stress: Not on file  Social Connections: Not on file  Intimate Partner Violence: Not on file    ROS Review of Systems  Psychiatric/Behavioral:  Positive for sleep disturbance.  Negative for self-injury and suicidal ideas. The patient is nervous/anxious.   All other systems reviewed and are negative.  Objective:   Today's Vitals: BP (!) 150/68 (BP Location: Left Arm, Patient Position: Sitting, Cuff Size: Large)   Pulse 76   Temp 98.1 F (36.7 C) (Oral)   Resp 16   Ht 5\' 2"  (1.575 m)   Wt 136 lb 9.6 oz (62 kg)   SpO2 96%   BMI 24.98 kg/m   Physical Exam Vitals and nursing note reviewed.  Constitutional:      General: She is not in acute distress. Cardiovascular:     Rate and Rhythm: Normal rate and regular rhythm.  Pulmonary:     Effort: Pulmonary effort is normal.     Breath sounds: Normal breath sounds.  Abdominal:     Palpations: Abdomen is soft.     Tenderness: There is no abdominal tenderness.  Musculoskeletal:     Right lower leg: No edema.     Left lower leg: No edema.  Neurological:     General: No focal deficit present.     Mental Status: She is alert and oriented to person,  place, and time.  Psychiatric:        Mood and Affect: Mood normal.        Behavior: Behavior normal.    Assessment & Plan:   1. Essential hypertension Lisinopril was increased from 10 mg to 20 mg secondary to elevated readings.  Will monitor. - lisinopril (ZESTRIL) 20 MG tablet; Take 1 tablet (20 mg total) by mouth daily.  Dispense: 90 tablet; Refill: 0  2. Hyperlipidemia, unspecified hyperlipidemia type Continue present management  3. Other insomnia Trazodone was prescribed. - traZODone (DESYREL) 50 MG tablet; TAKE 1 TABLET BY MOUTH EVERY DAY AT BEDTIME AS NEEDED FOR SLEEP  Dispense: 90 tablet; Refill: 0  4. Anxiety and depression Paxil was prescribed.  5. Paresthesia Gabapentin was prescribed. - gabapentin (NEURONTIN) 300 MG capsule; Take 1 capsule (300 mg total) by mouth 3 (three) times daily.  Dispense: 90 capsule; Refill: 2 - PARoxetine (PAXIL) 40 MG tablet; Take 1 tablet (40 mg total) by mouth every morning.  Dispense: 90 tablet; Refill: 0  6.  Rash and nonspecific skin eruption Questionable tinea versicolor.  Nizoral cream and tablets were prescribed.  7. Need for influenza vaccination  - Flu Vaccine QUAD 24mo+IM (Fluarix, Fluzone & Alfiuria Quad PF)  8. Encounter to establish care     Outpatient Encounter Medications as of 06/01/2021  Medication Sig   atorvastatin (LIPITOR) 10 MG tablet Take 1 tablet (10 mg total) by mouth daily.   HYDROcodone-acetaminophen (NORCO/VICODIN) 5-325 MG tablet Take 1-2 tablets by mouth every 6 (six) hours as needed for severe pain.   ketoconazole (NIZORAL) 2 % cream Apply 1 application topically daily.   [EXPIRED] ketoconazole (NIZORAL) 200 MG tablet Take 2 tablets (400 mg total) by mouth once for 1 dose.   lisinopril (ZESTRIL) 20 MG tablet Take 1 tablet (20 mg total) by mouth daily.   Multiple Vitamin (MULTIVITAMIN) tablet Take 1 tablet by mouth daily.   PARoxetine (PAXIL) 40 MG tablet Take 1 tablet (40 mg total) by mouth every morning.   [DISCONTINUED] gabapentin (NEURONTIN) 300 MG capsule Take 1 capsule (300 mg total) by mouth 3 (three) times daily.   [DISCONTINUED] lisinopril (ZESTRIL) 10 MG tablet Take 1 tablet (10 mg total) by mouth daily.   [DISCONTINUED] PARoxetine (PAXIL) 30 MG tablet Take 1 tablet (30 mg total) by mouth daily.   [DISCONTINUED] traZODone (DESYREL) 50 MG tablet TAKE 1 TABLET BY MOUTH EVERY DAY AT BEDTIME AS NEEDED FOR SLEEP . APPOINTMENT REQUIRED FOR FUTURE REFILLS   chlorhexidine (PERIDEX) 0.12 % solution Use as directed 15 mLs in the mouth or throat 2 (two) times daily. (Patient not taking: Reported on 06/01/2021)   cyanocobalamin 1000 MCG tablet Take 1 tablet (1,000 mcg total) by mouth daily. (Patient not taking: Reported on 06/01/2021)   gabapentin (NEURONTIN) 300 MG capsule Take 1 capsule (300 mg total) by mouth 3 (three) times daily.   oxybutynin (DITROPAN) 5 MG tablet Take 1 tablet (5 mg total) by mouth 2 (two) times daily. (Patient not taking: Reported on 06/01/2021)    traZODone (DESYREL) 50 MG tablet TAKE 1 TABLET BY MOUTH EVERY DAY AT BEDTIME AS NEEDED FOR SLEEP   Facility-Administered Encounter Medications as of 06/01/2021  Medication   0.9 %  sodium chloride infusion    Follow-up: Return in about 6 weeks (around 07/13/2021) for follow up.   Tommie Raymond, MD

## 2021-06-22 ENCOUNTER — Ambulatory Visit: Payer: Medicaid Other

## 2021-07-13 ENCOUNTER — Other Ambulatory Visit: Payer: Self-pay

## 2021-07-13 ENCOUNTER — Ambulatory Visit (INDEPENDENT_AMBULATORY_CARE_PROVIDER_SITE_OTHER): Payer: Medicaid Other | Admitting: Family Medicine

## 2021-07-13 ENCOUNTER — Encounter: Payer: Self-pay | Admitting: Family Medicine

## 2021-07-13 VITALS — BP 97/75 | HR 73 | Temp 97.7°F | Resp 16 | Wt 136.8 lb

## 2021-07-13 DIAGNOSIS — F419 Anxiety disorder, unspecified: Secondary | ICD-10-CM

## 2021-07-13 DIAGNOSIS — R21 Rash and other nonspecific skin eruption: Secondary | ICD-10-CM | POA: Diagnosis not present

## 2021-07-13 DIAGNOSIS — F32A Depression, unspecified: Secondary | ICD-10-CM | POA: Diagnosis not present

## 2021-07-13 MED ORDER — BUPROPION HCL ER (XL) 150 MG PO TB24: 150.0000 mg | ORAL_TABLET | Freq: Every day | ORAL | 1 refills | Status: DC

## 2021-07-13 NOTE — Progress Notes (Signed)
Established  Patient Office Visit  Subjective:  Patient ID: Kelsey Smith, female    DOB: August 26, 1961  Age: 60 y.o. MRN: [id number]  CC:  Chief Complaint  Patient presents with   Follow-up    Neck area    HPI Kelsey Smith presents for follow up of rash and anxiety/depression. Patient reports that her rash has not resolved even minimally with present management. She also reports improvement in mood but would like to increase the meds some, especially before the holiday.   Past Medical History:  Diagnosis Date   Alcoholism (HCC)    Aneurysm, cerebral    Anxiety    Arthritis    Depression    HLD (hyperlipidemia)    Hypertension    Stroke [facility])     Social History   Socioeconomic History   Marital status: Single    Spouse name: Not on file   Number of children: 0   Years of education: Not on file   Highest education level: Not on file  Occupational History   Occupation: hair stylist  Tobacco Use   Smoking status: Former    Types: Cigarettes    Quit date: 09/24/2016    Years since quitting: 4.8   Smokeless tobacco: Never  Vaping Use   Vaping Use: Never used  Substance and Sexual Activity   Alcohol use: No    Comment: quit in [place]  09-24-2016   Drug use: No   Sexual activity: Not on file  Other Topics Concern   Not on file  Social History Narrative   Pt doing well, (not doing drugs, alcohol, going to AA .     Social Determinants of Health   Financial Resource Strain: Not on file  Food Insecurity: Not on file  Transportation Needs: Not on file  Physical Activity: Not on file  Stress: Not on file  Social Connections: Not on file  Intimate Partner Violence: Not on file    ROS Review of Systems  Skin:  Positive for rash.  Psychiatric/Behavioral:  Positive for sleep disturbance. Negative for self-injury and suicidal ideas. The patient is nervous/anxious.   All other systems reviewed and are negative.  Objective:   Today's Vitals: BP 97/75   Pulse 73    Temp 97.7 F (36.5 C) (Oral)   Resp 16   Wt 136 lb 12.8 oz (62.1 kg)   SpO2 95%   BMI 25.02 kg/m   Physical Exam Vitals and nursing note reviewed.  Constitutional:      General: She is not in acute distress. Cardiovascular:     Rate and Rhythm: Normal rate and regular rhythm.  Pulmonary:     Effort: Pulmonary effort is normal.     Breath sounds: Normal breath sounds.  Abdominal:     Palpations: Abdomen is soft.     Tenderness: There is no abdominal tenderness.  Musculoskeletal:     Right lower leg: No edema.     Left lower leg: No edema.  Skin:    Findings: Rash present.  Neurological:     General: No focal deficit present.     Mental Status: She is alert and oriented to person, place, and time.  Psychiatric:        Mood and Affect: Mood normal.        Behavior: Behavior normal.    Assessment & Plan:   1. Rash and nonspecific skin eruption Referral to derm for further eval/mgt - Ambulatory referral to Dermatology  2. Anxiety and  depression Improved. Wellbutrin 150 mg daily prescribed to be added to regimen. monitor    Outpatient Encounter Medications as of 07/13/2021  Medication Sig   atorvastatin (LIPITOR) 10 MG tablet Take 1 tablet (10 mg total) by mouth daily.   buPROPion (WELLBUTRIN XL) 150 MG 24 hr tablet Take 1 tablet (150 mg total) by mouth daily.   chlorhexidine (PERIDEX) 0.12 % solution Use as directed 15 mLs in the mouth or throat 2 (two) times daily.   cyanocobalamin 1000 MCG tablet Take 1 tablet (1,000 mcg total) by mouth daily.   gabapentin (NEURONTIN) 300 MG capsule Take 1 capsule (300 mg total) by mouth 3 (three) times daily.   HYDROcodone-acetaminophen (NORCO/VICODIN) 5-325 MG tablet Take 1-2 tablets by mouth every 6 (six) hours as needed for severe pain.   ketoconazole (NIZORAL) 2 % cream Apply 1 application topically daily.   lisinopril (ZESTRIL) 20 MG tablet Take 1 tablet (20 mg total) by mouth daily.   Multiple Vitamin (MULTIVITAMIN) tablet  Take 1 tablet by mouth daily.   oxybutynin (DITROPAN) 5 MG tablet Take 1 tablet (5 mg total) by mouth 2 (two) times daily.   PARoxetine (PAXIL) 40 MG tablet Take 1 tablet (40 mg total) by mouth every morning.   traZODone (DESYREL) 50 MG tablet TAKE 1 TABLET BY MOUTH EVERY DAY AT BEDTIME AS NEEDED FOR SLEEP   Facility-Administered Encounter Medications as of 07/13/2021  Medication   0.9 %  sodium chloride infusion    Follow-up: Return in about 4 weeks (around 08/10/2021) for follow up.   Tommie Raymond, MD

## 2021-07-13 NOTE — Progress Notes (Signed)
Patient is here for follow-up neck area

## 2021-07-18 ENCOUNTER — Telehealth: Payer: Self-pay | Admitting: Family Medicine

## 2021-07-18 NOTE — Telephone Encounter (Signed)
Pt calling in to inquire about referral for Dermatology status and when this can be scheduled. Thank you

## 2021-07-19 NOTE — Telephone Encounter (Signed)
Patient was called and msg left  on answering machine

## 2021-08-15 ENCOUNTER — Ambulatory Visit: Payer: Medicaid Other | Admitting: Family Medicine

## 2021-09-05 ENCOUNTER — Encounter: Payer: Self-pay | Admitting: Family Medicine

## 2021-09-05 ENCOUNTER — Other Ambulatory Visit: Payer: Self-pay

## 2021-09-05 ENCOUNTER — Other Ambulatory Visit (HOSPITAL_COMMUNITY)
Admission: RE | Admit: 2021-09-05 | Discharge: 2021-09-05 | Disposition: A | Discharge status: Home / Self Care | Payer: Medicaid Other | Source: Ambulatory Visit | Attending: Family Medicine | Admitting: Family Medicine

## 2021-09-05 ENCOUNTER — Ambulatory Visit (INDEPENDENT_AMBULATORY_CARE_PROVIDER_SITE_OTHER): Payer: Medicaid Other | Admitting: Family Medicine

## 2021-09-05 VITALS — BP 158/86 | HR 75 | Temp 98.1°F | Resp 16 | Wt 144.2 lb

## 2021-09-05 DIAGNOSIS — I10 Essential (primary) hypertension: Secondary | ICD-10-CM

## 2021-09-05 DIAGNOSIS — Z01419 Encounter for gynecological examination (general) (routine) without abnormal findings: Secondary | ICD-10-CM

## 2021-09-05 DIAGNOSIS — Z1211 Encounter for screening for malignant neoplasm of colon: Secondary | ICD-10-CM

## 2021-09-05 DIAGNOSIS — Z131 Encounter for screening for diabetes mellitus: Secondary | ICD-10-CM

## 2021-09-05 DIAGNOSIS — Z1231 Encounter for screening mammogram for malignant neoplasm of breast: Secondary | ICD-10-CM

## 2021-09-05 MED ORDER — LISINOPRIL 20 MG PO TABS
20.0000 mg | ORAL_TABLET | Freq: Every day | ORAL | 0 refills | Status: DC
Start: 2021-09-05 — End: 2021-12-04

## 2021-09-05 MED ORDER — BUPROPION HCL ER (XL) 150 MG PO TB24: 150.0000 mg | ORAL_TABLET | Freq: Every day | ORAL | 0 refills | Status: DC

## 2021-09-05 MED ORDER — HYDROCHLOROTHIAZIDE 25 MG PO TABS: 25.0000 mg | ORAL_TABLET | Freq: Every day | ORAL | 0 refills | Status: DC

## 2021-09-05 NOTE — Progress Notes (Signed)
Patient is here for pap smear.Patient  would like a referral for colonoscopy and mammogram  ...Marland KitchenMarland KitchenKieth Brightly

## 2021-09-06 ENCOUNTER — Encounter: Payer: Self-pay | Admitting: Family Medicine

## 2021-09-06 LAB — LIPID PANEL
Chol/HDL Ratio: 2.2 ratio (ref 0.0–4.4)
Cholesterol, Total: 157 mg/dL (ref 100–199)
HDL: 73 mg/dL (ref >39)
LDL Chol Calc (NIH): 72 mg/dL (ref 0–99)
Triglycerides: 60 mg/dL (ref 0–149)
VLDL Cholesterol Cal: 12 mg/dL (ref 5–40)

## 2021-09-06 LAB — CBC WITH DIFFERENTIAL/PLATELET

## 2021-09-06 LAB — CMP14+EGFR
ALT: 30 IU/L (ref 0–32)
AST: 33 IU/L (ref 0–40)
Albumin/Globulin Ratio: 2.2 (ref 1.2–2.2)
Albumin: 4.8 g/dL (ref 3.8–4.9)
Alkaline Phosphatase: 77 IU/L (ref 44–121)
BUN/Creatinine Ratio: 14 (ref 9–23)
BUN: 11 mg/dL (ref 6–24)
Bilirubin Total: <0.2 mg/dL (ref 0.0–1.2)
CO2: 24 mmol/L (ref 20–29)
Calcium: 9.7 mg/dL (ref 8.7–10.2)
Chloride: 102 mmol/L (ref 96–106)
Creatinine, Ser: 0.77 mg/dL (ref 0.57–1.00)
Globulin, Total: 2.2 g/dL (ref 1.5–4.5)
Glucose: 83 mg/dL (ref 70–99)
Potassium: 5.2 mmol/L (ref 3.5–5.2)
Sodium: 140 mmol/L (ref 134–144)
Total Protein: 7 g/dL (ref 6.0–8.5)
eGFR: 89 mL/min/{1.73_m2} (ref >59)

## 2021-09-06 LAB — HEMOGLOBIN A1C
Est. average glucose Bld gHb Est-mCnc: 108 mg/dL
Hgb A1c MFr Bld: 5.4 % (ref 4.8–5.6)

## 2021-09-06 NOTE — Progress Notes (Signed)
New Patient Office Visit  Subjective:  Patient ID: Kelsey Smith, female    DOB: 16-Mar-1961  Age: 60 y.o. MRN: [id number]  CC:  Chief Complaint  Patient presents with   Gynecologic Exam    HPI Kelsey Smith presents for routine annual exam with pap. Patient denies acute complaints or concerns.   Past Medical History:  Diagnosis Date   Alcoholism ([place])    Aneurysm, cerebral    Anxiety    Arthritis    Depression    HLD (hyperlipidemia)    Hypertension    Stroke [facility])     Past Surgical History:  Procedure Laterality Date   ELBOW FRACTURE SURGERY Left    GYNECOLOGIC CRYOSURGERY      Family History  Problem Relation Age of Onset   Lung cancer Mother    Dementia Father    Brain cancer Brother    Lung cancer Brother    Breast cancer Maternal Grandmother    Breast cancer Paternal Aunt    Colon cancer Neg Hx     Social History   Socioeconomic History   Marital status: Single    Spouse name: Not on file   Number of children: 0   Years of education: Not on file   Highest education level: Not on file  Occupational History   Occupation: hair stylist  Tobacco Use   Smoking status: Former    Types: Cigarettes    Quit date: 09/24/2016    Years since quitting: 4.9   Smokeless tobacco: Never  Vaping Use   Vaping Use: Never used  Substance and Sexual Activity   Alcohol use: No    Comment: quit in [place]  09-24-2016   Drug use: No   Sexual activity: Not on file  Other Topics Concern   Not on file  Social History Narrative   Pt doing well, (not doing drugs, alcohol, going to AA .     Social Determinants of Health   Financial Resource Strain: Not on file  Food Insecurity: Not on file  Transportation Needs: Not on file  Physical Activity: Not on file  Stress: Not on file  Social Connections: Not on file  Intimate Partner Violence: Not on file    ROS Review of Systems  All other systems reviewed and are negative.  Objective:   Today's Vitals: BP (!)  158/86    Pulse 75    Temp 98.1 F (36.7 C) (Oral)    Resp 16    Wt 144 lb 3.2 oz (65.4 kg)    SpO2 95%    BMI 26.37 kg/m   Physical Exam Vitals and nursing note reviewed.  Constitutional:      General: She is not in acute distress. HENT:     Head: Normocephalic and atraumatic.     Right Ear: Tympanic membrane, ear canal and external ear normal.     Left Ear: Tympanic membrane, ear canal and external ear normal.     Nose: Nose normal.     Mouth/Throat:     Mouth: Mucous membranes are moist.     Pharynx: Oropharynx is clear.  Eyes:     Conjunctiva/sclera: Conjunctivae normal.     Pupils: Pupils are equal, round, and reactive to light.  Neck:     Thyroid: No thyromegaly.  Cardiovascular:     Rate and Rhythm: Normal rate and regular rhythm.     Heart sounds: Normal heart sounds. No murmur heard. Pulmonary:     Effort: Pulmonary effort  is normal. No respiratory distress.     Breath sounds: Normal breath sounds.  Abdominal:     General: There is no distension.     Palpations: Abdomen is soft. There is no mass.     Tenderness: There is no abdominal tenderness.     Hernia: There is no hernia in the left inguinal area or right inguinal area.  Genitourinary:    Exam position: Supine.     Labia:        Right: No rash.        Left: No rash.      Vagina: Normal.     Cervix: Normal.     Uterus: Normal.      Adnexa: Right adnexa normal.  Musculoskeletal:        General: Normal range of motion.     Cervical back: Normal range of motion and neck supple.  Skin:    General: Skin is warm and dry.  Neurological:     General: No focal deficit present.     Mental Status: She is alert and oriented to person, place, and time.  Psychiatric:        Mood and Affect: Mood normal.        Behavior: Behavior normal.    Assessment & Plan:   1. Well woman exam with routine gynecological exam Unremarkable exam. Routine labs ordered.  - CMP14+EGFR - CBC with Differential - Lipid Panel -  Cytology - PAP  2. Essential hypertension Reading slightly elevated. Continue present management and monitor. Meds refilled - lisinopril (ZESTRIL) 20 MG tablet; Take 1 tablet (20 mg total) by mouth daily.  Dispense: 90 tablet; Refill: 0 - hydrochlorothiazide (HYDRODIURIL) 25 MG tablet; Take 1 tablet (25 mg total) by mouth daily.  Dispense: 90 tablet; Refill: 0  3. Screening for colon cancer  - Ambulatory referral to Gastroenterology  4. Encounter for screening mammogram for malignant neoplasm of breast  - MM Digital Screening; Future  5. Screening for diabetes mellitus  - Hemoglobin A1c    Outpatient Encounter Medications as of 09/05/2021  Medication Sig   atorvastatin (LIPITOR) 10 MG tablet Take 1 tablet (10 mg total) by mouth daily.   chlorhexidine (PERIDEX) 0.12 % solution Use as directed 15 mLs in the mouth or throat 2 (two) times daily.   cyanocobalamin 1000 MCG tablet Take 1 tablet (1,000 mcg total) by mouth daily.   gabapentin (NEURONTIN) 300 MG capsule Take 1 capsule (300 mg total) by mouth 3 (three) times daily.   hydrochlorothiazide (HYDRODIURIL) 25 MG tablet Take 1 tablet (25 mg total) by mouth daily.   HYDROcodone-acetaminophen (NORCO/VICODIN) 5-325 MG tablet Take 1-2 tablets by mouth every 6 (six) hours as needed for severe pain.   ketoconazole (NIZORAL) 2 % cream Apply 1 application topically daily.   Multiple Vitamin (MULTIVITAMIN) tablet Take 1 tablet by mouth daily.   oxybutynin (DITROPAN) 5 MG tablet Take 1 tablet (5 mg total) by mouth 2 (two) times daily.   PARoxetine (PAXIL) 40 MG tablet Take 1 tablet (40 mg total) by mouth every morning.   traZODone (DESYREL) 50 MG tablet TAKE 1 TABLET BY MOUTH EVERY DAY AT BEDTIME AS NEEDED FOR SLEEP   [DISCONTINUED] buPROPion (WELLBUTRIN XL) 150 MG 24 hr tablet Take 1 tablet (150 mg total) by mouth daily.   [DISCONTINUED] lisinopril (ZESTRIL) 20 MG tablet Take 1 tablet (20 mg total) by mouth daily.   buPROPion (WELLBUTRIN  XL) 150 MG 24 hr tablet Take 1 tablet (150 mg total)  by mouth daily.   lisinopril (ZESTRIL) 20 MG tablet Take 1 tablet (20 mg total) by mouth daily.   Facility-Administered Encounter Medications as of 09/05/2021  Medication   0.9 %  sodium chloride infusion    Follow-up: Return in about 3 months (around 12/04/2021) for follow up.   Becky Sax, MD

## 2021-09-07 LAB — CBC WITH DIFFERENTIAL/PLATELET
Basophils Absolute: 0 10*3/uL (ref 0.0–0.2)
Basos: 1 %
EOS (ABSOLUTE): 0.1 10*3/uL (ref 0.0–0.4)
Eos: 2 %
Hematocrit: 36.2 % (ref 34.0–46.6)
Hemoglobin: 11.5 g/dL (ref 11.1–15.9)
Immature Grans (Abs): 0 10*3/uL (ref 0.0–0.1)
Immature Granulocytes: 1 %
Lymphocytes Absolute: 3.1 10*3/uL (ref 0.7–3.1)
Lymphs: 49 %
MCH: 33 pg (ref 26.6–33.0)
MCHC: 31.8 g/dL (ref 31.5–35.7)
MCV: 104 fL — ABNORMAL HIGH (ref 79–97)
Monocytes Absolute: 0.4 10*3/uL (ref 0.1–0.9)
Monocytes: 7 %
Neutrophils Absolute: 2.4 10*3/uL (ref 1.4–7.0)
Neutrophils: 40 %
Platelets: 348 10*3/uL (ref 150–450)
RBC: 3.48 x10E6/uL — ABNORMAL LOW (ref 3.77–5.28)
RDW: 13.1 % (ref 11.7–15.4)
WBC: 6.2 10*3/uL (ref 3.4–10.8)

## 2021-09-07 LAB — SPECIMEN STATUS REPORT

## 2021-09-11 LAB — CYTOLOGY - PAP
Chlamydia: NEGATIVE
Comment: NEGATIVE
Comment: NEGATIVE
Comment: NEGATIVE
Comment: NORMAL
Diagnosis: NEGATIVE
HSV1: NEGATIVE
HSV2: NEGATIVE
Neisseria Gonorrhea: NEGATIVE
Trichomonas: NEGATIVE

## 2021-09-20 NOTE — Progress Notes (Signed)
Patient was told her labs were good.

## 2021-10-05 ENCOUNTER — Other Ambulatory Visit: Payer: Self-pay | Admitting: Family Medicine

## 2021-10-05 ENCOUNTER — Other Ambulatory Visit: Payer: Self-pay | Admitting: Physical Medicine & Rehabilitation

## 2021-10-05 DIAGNOSIS — G4709 Other insomnia: Secondary | ICD-10-CM

## 2021-10-05 DIAGNOSIS — I10 Essential (primary) hypertension: Secondary | ICD-10-CM

## 2021-10-05 DIAGNOSIS — F32A Depression, unspecified: Secondary | ICD-10-CM

## 2021-10-05 DIAGNOSIS — F419 Anxiety disorder, unspecified: Secondary | ICD-10-CM

## 2021-10-19 ENCOUNTER — Ambulatory Visit
Admission: RE | Admit: 2021-10-19 | Discharge: 2021-10-19 | Disposition: A | Discharge status: Home / Self Care | Payer: Medicaid Other | Source: Ambulatory Visit | Attending: Family Medicine | Admitting: Family Medicine

## 2021-10-19 DIAGNOSIS — Z1231 Encounter for screening mammogram for malignant neoplasm of breast: Secondary | ICD-10-CM

## 2021-11-01 ENCOUNTER — Other Ambulatory Visit: Payer: Self-pay | Admitting: Family Medicine

## 2021-11-01 DIAGNOSIS — R202 Paresthesia of skin: Secondary | ICD-10-CM

## 2021-11-02 ENCOUNTER — Other Ambulatory Visit: Payer: Self-pay | Admitting: Family Medicine

## 2021-11-02 DIAGNOSIS — G4709 Other insomnia: Secondary | ICD-10-CM

## 2021-12-04 ENCOUNTER — Other Ambulatory Visit: Payer: Self-pay

## 2021-12-04 ENCOUNTER — Ambulatory Visit (INDEPENDENT_AMBULATORY_CARE_PROVIDER_SITE_OTHER): Payer: Medicaid Other | Admitting: Family Medicine

## 2021-12-04 ENCOUNTER — Encounter: Payer: Self-pay | Admitting: Family Medicine

## 2021-12-04 VITALS — BP 128/86 | HR 67 | Temp 98.1°F | Resp 16 | Wt 138.6 lb

## 2021-12-04 DIAGNOSIS — F32A Depression, unspecified: Secondary | ICD-10-CM | POA: Diagnosis not present

## 2021-12-04 DIAGNOSIS — G4700 Insomnia, unspecified: Secondary | ICD-10-CM | POA: Diagnosis not present

## 2021-12-04 DIAGNOSIS — R42 Dizziness and giddiness: Secondary | ICD-10-CM | POA: Diagnosis not present

## 2021-12-04 DIAGNOSIS — R202 Paresthesia of skin: Secondary | ICD-10-CM | POA: Diagnosis not present

## 2021-12-04 DIAGNOSIS — R21 Rash and other nonspecific skin eruption: Secondary | ICD-10-CM

## 2021-12-04 DIAGNOSIS — E785 Hyperlipidemia, unspecified: Secondary | ICD-10-CM | POA: Diagnosis not present

## 2021-12-04 DIAGNOSIS — F419 Anxiety disorder, unspecified: Secondary | ICD-10-CM

## 2021-12-04 DIAGNOSIS — G4709 Other insomnia: Secondary | ICD-10-CM

## 2021-12-04 DIAGNOSIS — I619 Nontraumatic intracerebral hemorrhage, unspecified: Secondary | ICD-10-CM | POA: Diagnosis not present

## 2021-12-04 DIAGNOSIS — I10 Essential (primary) hypertension: Secondary | ICD-10-CM | POA: Diagnosis not present

## 2021-12-04 MED ORDER — LISINOPRIL 20 MG PO TABS: 20.0000 mg | ORAL_TABLET | Freq: Every day | ORAL | 0 refills | Status: DC

## 2021-12-04 MED ORDER — ATORVASTATIN CALCIUM 10 MG PO TABS: 10.0000 mg | ORAL_TABLET | Freq: Every day | ORAL | 0 refills | Status: DC

## 2021-12-04 MED ORDER — HYDROCHLOROTHIAZIDE 25 MG PO TABS: 25.0000 mg | ORAL_TABLET | Freq: Every day | ORAL | 0 refills | Status: DC

## 2021-12-04 MED ORDER — BUPROPION HCL ER (XL) 150 MG PO TB24: 150.0000 mg | ORAL_TABLET | Freq: Every day | ORAL | 0 refills | Status: DC

## 2021-12-04 MED ORDER — PAROXETINE HCL 40 MG PO TABS: 40.0000 mg | ORAL_TABLET | Freq: Every morning | ORAL | 0 refills | Status: DC

## 2021-12-04 MED ORDER — TRIAMCINOLONE ACETONIDE 0.1 % EX CREA: 1.0000 "application " | TOPICAL_CREAM | Freq: Two times a day (BID) | CUTANEOUS | 1 refills | Status: AC

## 2021-12-04 MED ORDER — TRAZODONE HCL 50 MG PO TABS: ORAL_TABLET | ORAL | 0 refills | Status: DC

## 2021-12-04 NOTE — Progress Notes (Unsigned)
Established Patient Office Visit  Subjective:  Patient ID: Kelsey Smith, female    DOB: Jul 24, 1961  Age: 61 y.o. MRN: [id number]  CC:  Chief Complaint  Patient presents with   Follow-up   Hypertension    HPI Kelsey L Son presents for ***  Past Medical History:  Diagnosis Date   Alcoholism ([place])    Aneurysm, cerebral    Anxiety    Arthritis    Depression    HLD (hyperlipidemia)    Hypertension    Stroke [facility])     Past Surgical History:  Procedure Laterality Date   ELBOW FRACTURE SURGERY Left    GYNECOLOGIC CRYOSURGERY      Family History  Problem Relation Age of Onset   Lung cancer Mother    Dementia Father    Brain cancer Brother    Lung cancer Brother    Breast cancer Maternal Grandmother    Breast cancer Paternal Aunt    Colon cancer Neg Hx     Social History   Socioeconomic History   Marital status: Single    Spouse name: Not on Smith   Number of children: 0   Years of education: Not on Smith   Highest education level: Not on Smith  Occupational History   Occupation: hair stylist  Tobacco Use   Smoking status: Former    Types: Cigarettes    Quit date: 09/24/2016    Years since quitting: 5.1   Smokeless tobacco: Never  Vaping Use   Vaping Use: Never used  Substance and Sexual Activity   Alcohol use: No    Comment: quit in [place]  09-24-2016   Drug use: No   Sexual activity: Not on Smith  Other Topics Concern   Not on Smith  Social History Narrative   Pt doing well, (not doing drugs, alcohol, going to AA .     Social Determinants of Health   Financial Resource Strain: Not on Smith  Food Insecurity: Not on Smith  Transportation Needs: Not on Smith  Physical Activity: Not on Smith  Stress: Not on Smith  Social Connections: Not on Smith  Intimate Partner Violence: Not on Smith    ROS Review of Systems  Objective:   Today's Vitals: BP 128/86    Pulse 67    Temp 98.1 F (36.7 C) (Oral)    Resp 16    Wt 138 lb 9.6 oz (62.9 kg)    SpO2 96%     BMI 25.35 kg/m   Physical Exam  Assessment & Plan:   Problem List Items Addressed This Visit       Cardiovascular and Mediastinum   Essential hypertension - Primary     Other   Anxiety and depression   Hyperlipidemia    Outpatient Encounter Medications as of 12/04/2021  Medication Sig   atorvastatin (LIPITOR) 10 MG tablet Take 1 tablet (10 mg total) by mouth daily.   buPROPion (WELLBUTRIN XL) 150 MG 24 hr tablet Take 1 tablet (150 mg total) by mouth daily.   chlorhexidine (PERIDEX) 0.12 % solution Use as directed 15 mLs in the mouth or throat 2 (two) times daily.   cyanocobalamin 1000 MCG tablet Take 1 tablet (1,000 mcg total) by mouth daily.   gabapentin (NEURONTIN) 300 MG capsule Take 1 capsule (300 mg total) by mouth 3 (three) times daily.   hydrochlorothiazide (HYDRODIURIL) 25 MG tablet Take 1 tablet (25 mg total) by mouth daily.   lisinopril (ZESTRIL) 20 MG tablet Take 1 tablet (  20 mg total) by mouth daily.   Multiple Vitamin (MULTIVITAMIN) tablet Take 1 tablet by mouth daily.   oxybutynin (DITROPAN) 5 MG tablet Take 1 tablet (5 mg total) by mouth 2 (two) times daily.   PARoxetine (PAXIL) 40 MG tablet TAKE 1 TABLET BY MOUTH ONCE DAILY IN THE MORNING   traZODone (DESYREL) 50 MG tablet TAKE 1 TABLET BY MOUTH EVERY DAY AT BEDTIME AS NEEDED FOR SLEEP   HYDROcodone-acetaminophen (NORCO/VICODIN) 5-325 MG tablet Take 1-2 tablets by mouth every 6 (six) hours as needed for severe pain.   ketoconazole (NIZORAL) 2 % cream Apply 1 application topically daily.   Facility-Administered Encounter Medications as of 12/04/2021  Medication   0.9 %  sodium chloride infusion    Follow-up: No follow-ups on Smith.   Becky Sax, MD

## 2021-12-04 NOTE — Progress Notes (Signed)
Patient is her for her 3 month f/up HTN. Patient is concern about her dizzy spells that she has been getting . Patient would like to have a CT scan of head post TIA ?

## 2021-12-05 ENCOUNTER — Encounter: Payer: Self-pay | Admitting: Family Medicine

## 2022-02-07 ENCOUNTER — Institutional Professional Consult (permissible substitution): Payer: Medicaid Other | Admitting: Diagnostic Neuroimaging

## 2022-02-08 ENCOUNTER — Encounter: Payer: Self-pay | Admitting: Diagnostic Neuroimaging

## 2022-02-28 ENCOUNTER — Encounter: Payer: Self-pay | Admitting: Physical Medicine & Rehabilitation

## 2022-02-28 ENCOUNTER — Encounter: Payer: Medicaid Other | Attending: Physical Medicine & Rehabilitation | Admitting: Physical Medicine & Rehabilitation

## 2022-02-28 VITALS — BP 108/65 | HR 86 | Ht 62.0 in | Wt 133.0 lb

## 2022-02-28 DIAGNOSIS — N3281 Overactive bladder: Secondary | ICD-10-CM | POA: Diagnosis not present

## 2022-02-28 DIAGNOSIS — I619 Nontraumatic intracerebral hemorrhage, unspecified: Secondary | ICD-10-CM | POA: Diagnosis not present

## 2022-02-28 MED ORDER — OXYBUTYNIN CHLORIDE 5 MG PO TABS: 5.0000 mg | ORAL_TABLET | Freq: Two times a day (BID) | ORAL | 3 refills | Status: AC

## 2022-02-28 NOTE — Patient Instructions (Signed)
STOP WELLBUTRIN  DON'T START DITROPAN (BLADDER MEDICINE) FOR A WEEK.   SUPPLEMENTS USEFUL FOR OSTEOARTHRITIS: OMEGA 3 FATTY ACIDS, TURMERIC, GINGER, TART CHERRY EXTRACT, CELERY SEED, GLUCOSAMINE WITH CHONDROITIN

## 2022-02-28 NOTE — Progress Notes (Signed)
Subjective:    Patient ID: Kelsey Smith, female    DOB: 02-10-1961, 61 y.o.   MRN: [id number]  HPI  Kelsey is here in follow up of her left thalamic hemorrhage. I last saw her a year ago. She's been doing fairly this past year. She reports no new health issues. She does a little walking and some regular stretches and stationary exercise.   She deals with some generalized pain in her back and knees. She has tingling in her right leg which can wax and wane.  She has had some dental issues which she is having treated.   She remains on her meds as prescribed. Mood has been fairly positive. Her primary started her on wellbutrin.  Sleep is fair. She hasn't been using trazodone regularly though. She is having hot flashes  Pain Inventory Average Pain 4 Pain Right Now 4 My pain is dull  LOCATION OF PAIN  back, knee  BOWEL Number of stools per week: 5   BLADDER Normal    Mobility walk without assistance how many minutes can you walk? 30 ability to climb steps?  yes do you drive?  yes  Function not employed: date last employed .  Neuro/Psych No problems in this area  Prior Studies Any changes since last visit?  no  Physicians involved in your care Any changes since last visit?  no   Family History  Problem Relation Age of Onset   Lung cancer Mother    Dementia Father    Brain cancer Brother    Lung cancer Brother    Breast cancer Maternal Grandmother    Breast cancer Paternal Aunt    Colon cancer Neg Hx    Social History   Socioeconomic History   Marital status: Single    Spouse name: Not on file   Number of children: 0   Years of education: Not on file   Highest education level: Not on file  Occupational History   Occupation: hair stylist  Tobacco Use   Smoking status: Former    Types: Cigarettes    Quit date: 09/24/2016    Years since quitting: 5.4   Smokeless tobacco: Never  Vaping Use   Vaping Use: Never used  Substance and Sexual Activity    Alcohol use: No    Comment: quit in [place]  09-24-2016   Drug use: No   Sexual activity: Not on file  Other Topics Concern   Not on file  Social History Narrative   Pt doing well, (not doing drugs, alcohol, going to AA .     Social Determinants of Health   Financial Resource Strain: Not on file  Food Insecurity: Not on file  Transportation Needs: Not on file  Physical Activity: Not on file  Stress: Not on file  Social Connections: Not on file   Past Surgical History:  Procedure Laterality Date   ELBOW FRACTURE SURGERY Left    GYNECOLOGIC CRYOSURGERY     Past Medical History:  Diagnosis Date   Alcoholism (HCC)    Aneurysm, cerebral    Anxiety    Arthritis    Depression    HLD (hyperlipidemia)    Hypertension    Stroke (HCC)    BP 108/65   Pulse 86   Ht 5\' 2"  (1.575 m)   Wt 133 lb (60.3 kg)   SpO2 98%   BMI 24.33 kg/m   Opioid Risk Score:   Fall Risk Score:  `1  Depression screen [facility] 2/9  02/28/2022    8:44 AM 12/04/2021    8:24 AM 09/05/2021    1:35 PM 07/13/2021    8:17 AM 09/22/2018    9:35 AM 03/05/2018   10:47 AM 08/30/2017   10:41 AM  Depression screen PHQ 2/9  Decreased Interest 1 0 1 2 1 1 1   Down, Depressed, Hopeless 2 0 1 1 1 1 1   PHQ - 2 Score 3 0 2 3 2 2 2   Altered sleeping  2 1 1  1 3   Tired, decreased energy  0 1 1  1 1   Change in appetite  0 1 1  0 0  Feeling bad or failure about yourself   0 1 1  1 1   Trouble concentrating  0 1 1  1 2   Moving slowly or fidgety/restless  0 1 1  0 1  Suicidal thoughts  0 0 0  0 0  PHQ-9 Score  2 8 9  6 10   Difficult doing work/chores  Not difficult at all Not difficult at all         Review of Systems  Constitutional: Negative.   HENT: Negative.    Eyes: Negative.   Respiratory: Negative.    Cardiovascular: Negative.   Gastrointestinal: Negative.   Endocrine: Negative.   Genitourinary: Negative.   Musculoskeletal: Negative.   Skin: Negative.   Allergic/Immunologic: Negative.   Neurological:  Negative.   Hematological: Negative.   Psychiatric/Behavioral: Negative.        Objective:   Physical Exam  General: No acute distress HEENT: NCAT, EOMI, oral membranes moist Cards: reg rate  Chest: normal effort Abdomen: Soft, NT, ND Skin: dry, intact Extremities: no edema Psych: pleasant and appropriate  Psych: pleasant and cooperative Neuro: Right sided sensory loss ongoing.  gait quite stable  Musculoskeletal:  ongoing levoscoliosis upper lumbar and low thoracic spine. Tends to lean a little bit to right.              Assessment & Plan:  1.  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             -discussed posture and mechanics 3. Right  and left leg pain--neuropathic component             -HEP             -mild OA component               4. Mood:              -increased paxil to 40mg              -she will hold wellbutrin d/t hot flashes AND see if they subside 5. Polysubstance abuse: Encouraged her to resume going to her AA meetings 6. Bilateral wrist and finger pain: predominantly de quervain's tenosynovitis.                  -mostly improved                7. Urinary frequency:  -resume ditropan 5mg  bid                               15 minutes of face to face patient care time were spent during this visit. All questions were encouraged and answered.  Follow up with me in 1 year.

## 2022-03-08 ENCOUNTER — Ambulatory Visit: Payer: Medicaid Other | Admitting: Family Medicine

## 2022-04-11 ENCOUNTER — Ambulatory Visit: Payer: Medicaid Other | Admitting: Family Medicine

## 2022-04-24 IMAGING — MG MM DIGITAL SCREENING BILAT W/ TOMO AND CAD
8 series · 8 of 24 positions shown · non-contrast
Comparison: Previous exam(s).

CLINICAL DATA: Screening.

EXAM:
DIGITAL SCREENING BILATERAL MAMMOGRAM WITH TOMOSYNTHESIS AND CAD
TECHNIQUE: Bilateral screening digital craniocaudal and mediolateral oblique
mammograms were obtained. Bilateral screening digital breast
tomosynthesis was performed. The images were evaluated with
computer-aided detection.

[L CC synth-2D]
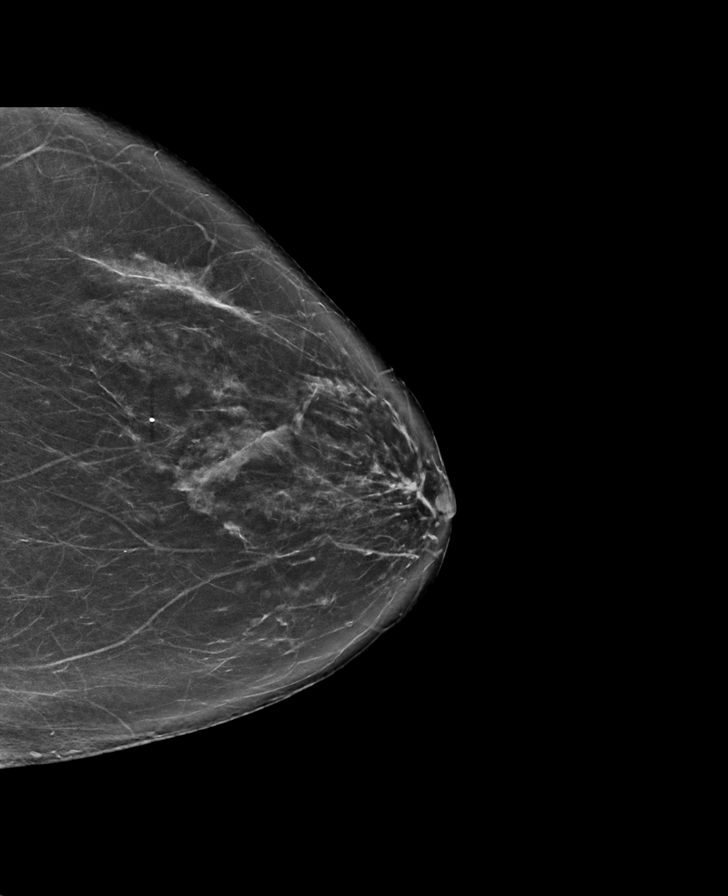

[R MLO synth-2D]
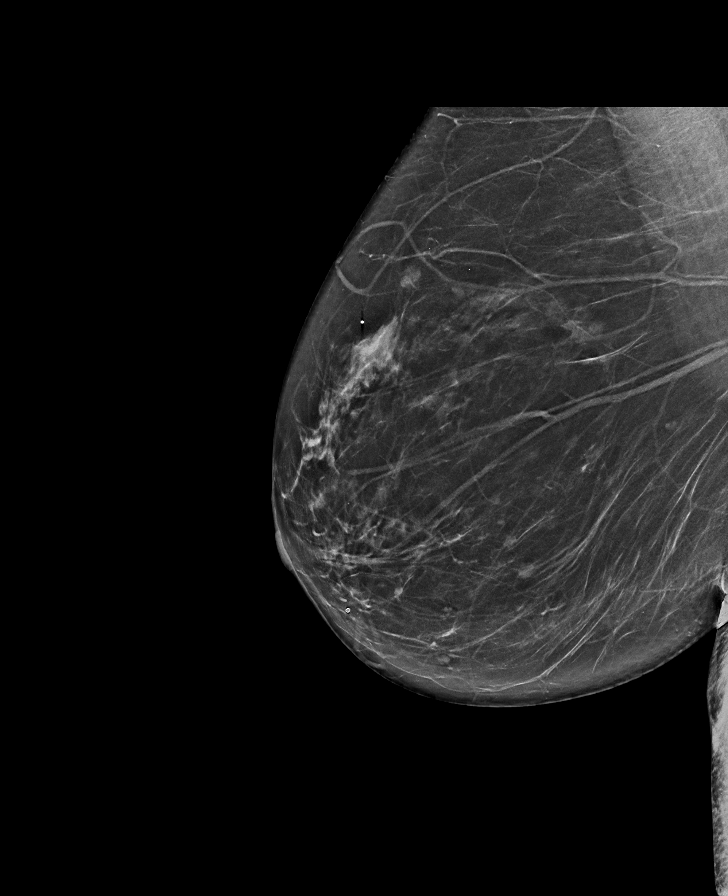

[R CC synth-2D]
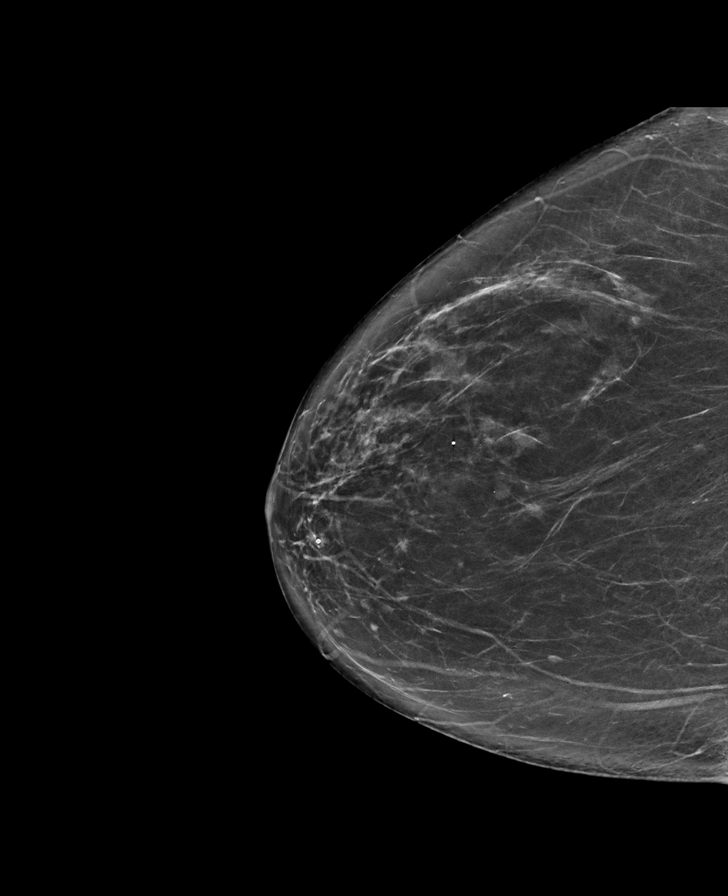

[L MLO synth-2D]
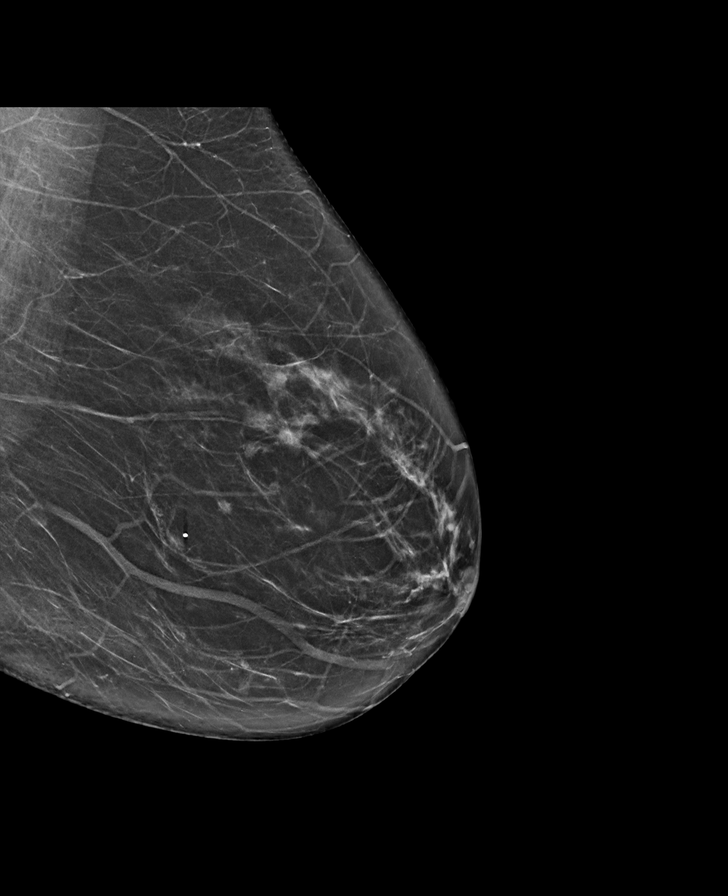

[L MLO tomo · tomo slice 35/69.0]
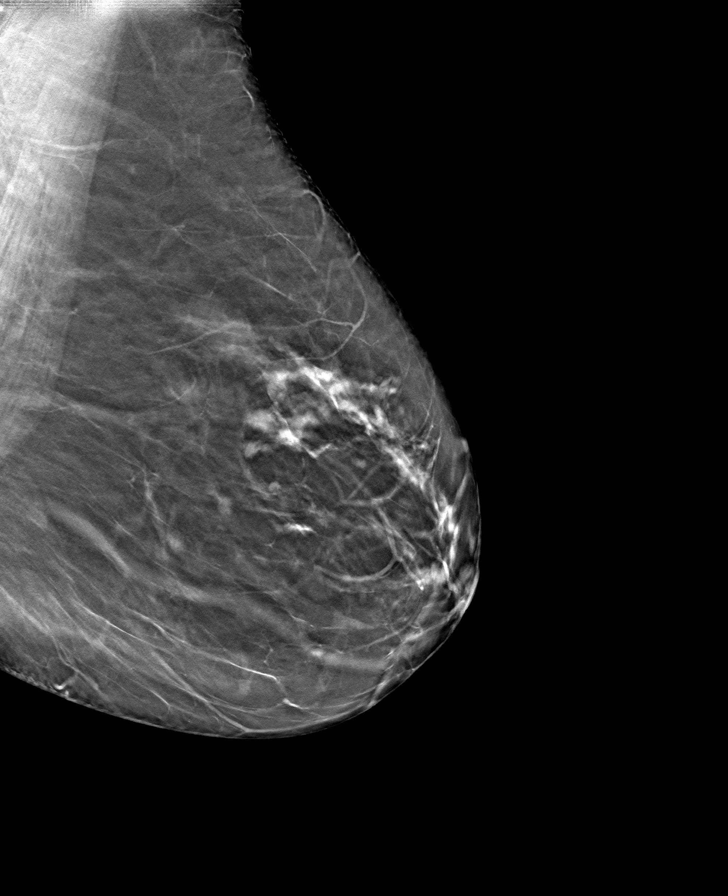

[R CC tomo · tomo slice 36/71.0]
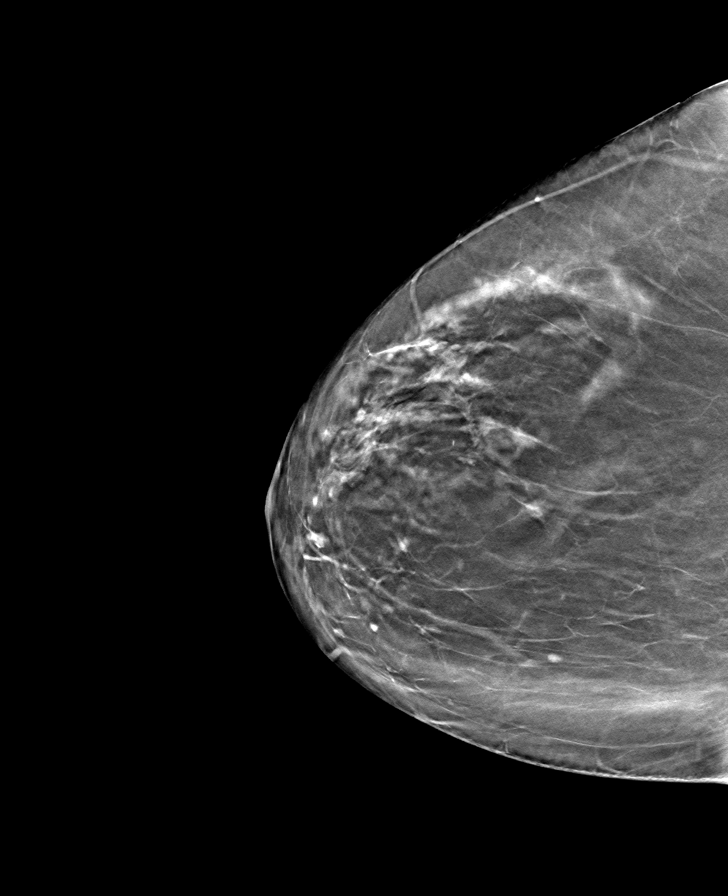

[L CC tomo · tomo slice 37/73.0]
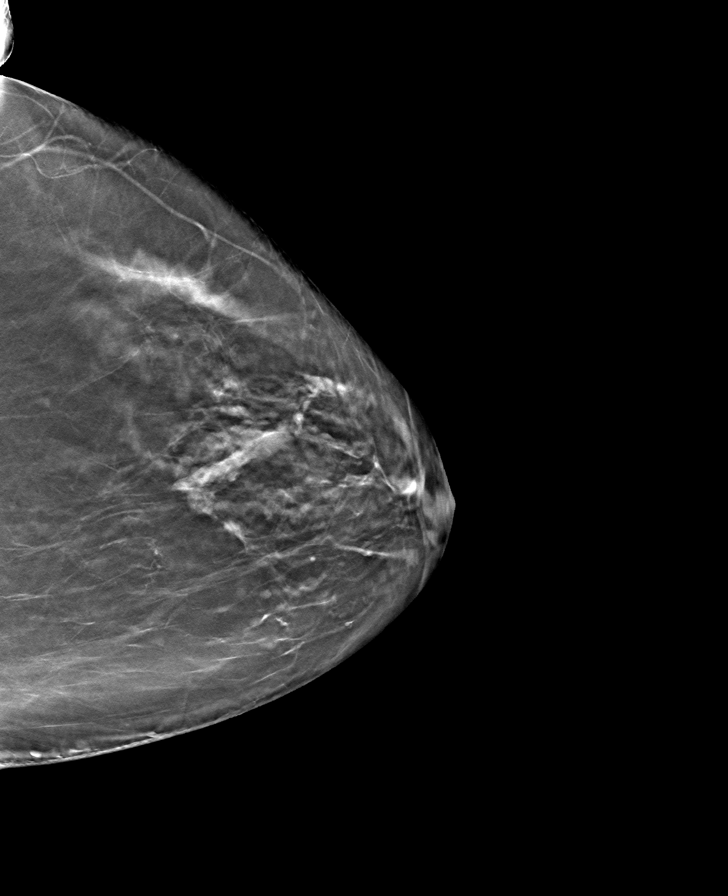

[R MLO tomo · tomo slice 37/73.0]
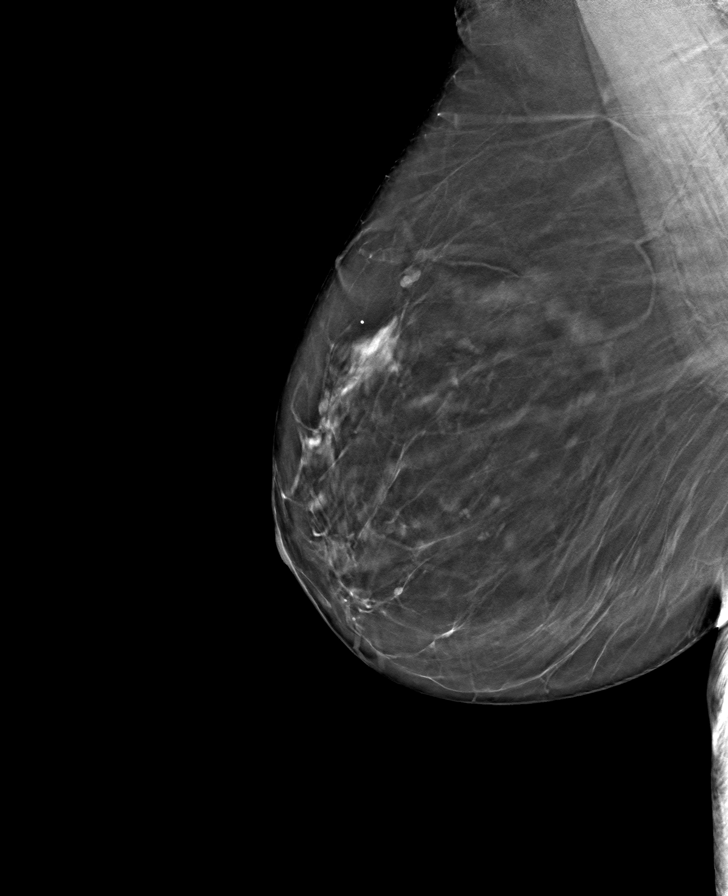

[8 of 24 positions shown; findings below may reference images not displayed]

ACR Breast Density Category b: There are scattered areas of
fibroglandular density.
FINDINGS: There are no findings suspicious for malignancy.
IMPRESSION: No mammographic evidence of malignancy. A result letter of this
screening mammogram will be mailed directly to the patient.

RECOMMENDATION:
Screening mammogram in one year. (Code:51-O-LD2)

BI-RADS CATEGORY  1: Negative.

## 2022-06-17 ENCOUNTER — Other Ambulatory Visit: Payer: Self-pay | Admitting: Family Medicine

## 2022-06-17 DIAGNOSIS — I619 Nontraumatic intracerebral hemorrhage, unspecified: Secondary | ICD-10-CM

## 2022-06-17 DIAGNOSIS — I10 Essential (primary) hypertension: Secondary | ICD-10-CM

## 2022-06-17 DIAGNOSIS — R202 Paresthesia of skin: Secondary | ICD-10-CM

## 2022-06-24 ENCOUNTER — Other Ambulatory Visit: Payer: Self-pay | Admitting: Family Medicine

## 2022-06-24 DIAGNOSIS — I10 Essential (primary) hypertension: Secondary | ICD-10-CM

## 2022-06-24 DIAGNOSIS — R202 Paresthesia of skin: Secondary | ICD-10-CM

## 2022-06-24 DIAGNOSIS — I619 Nontraumatic intracerebral hemorrhage, unspecified: Secondary | ICD-10-CM

## 2022-06-25 NOTE — Telephone Encounter (Signed)
Courtesy refill. Patient must have office visit for further refills. Requested Prescriptions  Pending Prescriptions Disp Refills  . atorvastatin (LIPITOR) 10 MG tablet [Pharmacy Med Name: Atorvastatin Calcium 10 MG Oral Tablet] 90 tablet 0    Sig: Take 1 tablet by mouth once daily     Cardiovascular:  Antilipid - Statins Failed - 06/24/2022 11:31 AM      Failed - Lipid Panel in normal range within the last 12 months    Cholesterol, Total  Date Value Ref Range Status  09/05/2021 157 100 - 199 mg/dL Final   LDL Chol Calc (NIH)  Date Value Ref Range Status  09/05/2021 72 0 - 99 mg/dL Final   HDL  Date Value Ref Range Status  09/05/2021 73 >39 mg/dL Final   Triglycerides  Date Value Ref Range Status  09/05/2021 60 0 - 149 mg/dL Final         Passed - Patient is not pregnant      Passed - Valid encounter within last 12 months    Recent Outpatient Visits          6 months ago Essential hypertension   Primary Care at [facility], MD   9 months ago Well woman exam with routine gynecological exam   Primary Care at [facility], Lauris Poag, MD   11 months ago Rash and nonspecific skin eruption   [facility] Care at [facility], MD   1 year ago Essential hypertension   [facility] at [facility], MD   4 years ago Screening for diabetes mellitus   [facility] [place], [place], MD             . PARoxetine (PAXIL) 40 MG tablet [Pharmacy Med Name: PARoxetine HCl 40 MG Oral Tablet] 30 tablet 0    Sig: TAKE 1 TABLET BY MOUTH ONCE DAILY IN THE MORNING     Psychiatry:  Antidepressants - SSRI Failed - 06/24/2022 11:31 AM      Failed - Valid encounter within last 6 months    Recent Outpatient Visits          6 months ago Essential hypertension   [facility] Care at [facility], MD   9 months ago Well woman exam with routine gynecological exam   Primary Care at [facility],  Lauris Poag, MD   11 months ago Rash and nonspecific skin eruption   Primary Care at [facility], MD   1 year ago Essential hypertension   Primary Care at [facility], MD   4 years ago Screening for diabetes mellitus   [place] [facility] [place], [place], MD             Passed - Completed PHQ-2 or PHQ-9 in the last 360 days      . lisinopril (ZESTRIL) 20 MG tablet [Pharmacy Med Name: Lisinopril 20 MG Oral Tablet] 30 tablet 0    Sig: Take 1 tablet by mouth once daily     Cardiovascular:  ACE Inhibitors Failed - 06/24/2022 11:31 AM      Failed - Cr in normal range and within 180 days    Creat  Date Value Ref Range Status  10/29/2016 0.74 0.50 - 1.05 mg/dL Final    Comment:      For patients > or = 61 years of age: The upper reference limit for Creatinine is approximately 13% higher for people identified as  African-American.      Creatinine, Ser  Date Value Ref Range Status  09/05/2021 0.77 0.57 - 1.00 mg/dL Final         Failed - K in normal range and within 180 days    Potassium  Date Value Ref Range Status  09/05/2021 5.2 3.5 - 5.2 mmol/L Final         Failed - Valid encounter within last 6 months    Recent Outpatient Visits          6 months ago Essential hypertension   [facility] at [facility], MD   9 months ago Well woman exam with routine gynecological exam   Primary Care at [facility], Clyde [place], MD   11 months ago Rash and nonspecific skin eruption   Primary Care at [facility], MD   1 year ago Essential hypertension   Primary Care at [facility], MD   4 years ago Screening for diabetes mellitus   [place], Charlane Ferretti, MD             Passed - Patient is not pregnant      Passed - Last BP in normal range    BP Readings from Last 1 Encounters:  02/28/22 108/65

## 2022-06-27 ENCOUNTER — Other Ambulatory Visit: Payer: Self-pay | Admitting: Family Medicine

## 2022-06-27 DIAGNOSIS — I619 Nontraumatic intracerebral hemorrhage, unspecified: Secondary | ICD-10-CM

## 2022-06-27 NOTE — Telephone Encounter (Signed)
Called pharmacy to verify if patient has picked up Rx. Placed on hold greater than a couple of minutes. Unable to continue to hold. Receipt confirmed from pharmacy 06/25/22 at 2:47 pm.

## 2022-06-27 NOTE — Telephone Encounter (Signed)
Call to pharmacy - left message on VM- Rx requested is duplicate and refused- original Rx sent 06/25/22 #90 and messaged as Rx received by pharmacy.  Please call office if this Rx is not on file

## 2022-06-27 NOTE — Telephone Encounter (Signed)
Requested by interface surescripts. Called pharmacy to verify if patient picked up medication . Place on hold unable to hold greater than a couple of minutes. Receipt confirmed by pharmacy 06/26/23 at 2:47 pm. Requested Prescriptions  Refused Prescriptions Disp Refills  . atorvastatin (LIPITOR) 10 MG tablet [Pharmacy Med Name: Atorvastatin Calcium 10 MG Oral Tablet] 90 tablet 0    Sig: Take 1 tablet by mouth once daily     Cardiovascular:  Antilipid - Statins Failed - 06/27/2022  2:51 PM      Failed - Lipid Panel in normal range within the last 12 months    Cholesterol, Total  Date Value Ref Range Status  09/05/2021 157 100 - 199 mg/dL Final   LDL Chol Calc (NIH)  Date Value Ref Range Status  09/05/2021 72 0 - 99 mg/dL Final   HDL  Date Value Ref Range Status  09/05/2021 73 >39 mg/dL Final   Triglycerides  Date Value Ref Range Status  09/05/2021 60 0 - 149 mg/dL Final         Passed - Patient is not pregnant      Passed - Valid encounter within last 12 months    Recent Outpatient Visits          6 months ago Essential hypertension   Primary [facility] at [facility], MD   9 months ago Well woman exam with routine gynecological exam   Primary Care at [facility], Clyde Canterbury, MD   11 months ago Rash and nonspecific skin eruption   Primary Care at [facility], MD   1 year ago Essential hypertension   Primary Care at [facility], MD   4 years ago Screening for diabetes mellitus    [facility], MD

## 2022-08-03 ENCOUNTER — Encounter: Payer: Self-pay | Admitting: Family Medicine

## 2022-08-03 ENCOUNTER — Ambulatory Visit (INDEPENDENT_AMBULATORY_CARE_PROVIDER_SITE_OTHER): Payer: Medicaid Other | Admitting: Family Medicine

## 2022-08-03 VITALS — BP 121/84 | HR 74 | Temp 98.1°F | Resp 16 | Wt 134.8 lb

## 2022-08-03 DIAGNOSIS — F419 Anxiety disorder, unspecified: Secondary | ICD-10-CM | POA: Diagnosis not present

## 2022-08-03 DIAGNOSIS — I10 Essential (primary) hypertension: Secondary | ICD-10-CM | POA: Diagnosis not present

## 2022-08-03 DIAGNOSIS — L989 Disorder of the skin and subcutaneous tissue, unspecified: Secondary | ICD-10-CM

## 2022-08-03 DIAGNOSIS — E785 Hyperlipidemia, unspecified: Secondary | ICD-10-CM

## 2022-08-03 DIAGNOSIS — F32A Depression, unspecified: Secondary | ICD-10-CM

## 2022-08-03 DIAGNOSIS — G4709 Other insomnia: Secondary | ICD-10-CM | POA: Diagnosis not present

## 2022-08-03 MED ORDER — BUPROPION HCL ER (XL) 150 MG PO TB24: 150.0000 mg | ORAL_TABLET | Freq: Every day | ORAL | 1 refills | Status: DC

## 2022-08-03 MED ORDER — TRAZODONE HCL 50 MG PO TABS: ORAL_TABLET | ORAL | 1 refills | Status: DC

## 2022-08-03 MED ORDER — LISINOPRIL 20 MG PO TABS: 20.0000 mg | ORAL_TABLET | Freq: Every day | ORAL | 1 refills | Status: DC

## 2022-08-03 MED ORDER — ATORVASTATIN CALCIUM 10 MG PO TABS: 10.0000 mg | ORAL_TABLET | Freq: Every day | ORAL | 1 refills | Status: DC

## 2022-08-03 MED ORDER — PAROXETINE HCL 40 MG PO TABS: 40.0000 mg | ORAL_TABLET | Freq: Every morning | ORAL | 1 refills | Status: DC

## 2022-08-03 NOTE — Progress Notes (Unsigned)
Patient is here for their 6 month follow-up Patient has no concerns today Care gaps have been discussed with patient  

## 2022-08-03 NOTE — Progress Notes (Unsigned)
Established Patient Office Visit  Subjective    Patient ID: Kelsey Smith, female    DOB: 02-28-1961  Age: 61 y.o. MRN: [id number]  CC:  Chief Complaint  Patient presents with   Follow-up    6 month    HPI Kelsey Smith presents to establish care   Outpatient Encounter Medications as of 08/03/2022  Medication Sig   chlorhexidine (PERIDEX) 0.12 % solution Use as directed 15 mLs in the mouth or throat 2 (two) times daily.   cyanocobalamin 1000 MCG tablet Take 1 tablet (1,000 mcg total) by mouth daily.   gabapentin (NEURONTIN) 300 MG capsule Take 1 capsule (300 mg total) by mouth 3 (three) times daily.   hydrochlorothiazide (HYDRODIURIL) 25 MG tablet Take 1 tablet (25 mg total) by mouth daily.   Multiple Vitamin (MULTIVITAMIN) tablet Take 1 tablet by mouth daily.   oxybutynin (DITROPAN) 5 MG tablet Take 1 tablet (5 mg total) by mouth 2 (two) times daily.   triamcinolone cream (KENALOG) 0.1 % Apply 1 application. topically 2 (two) times daily.   [DISCONTINUED] atorvastatin (LIPITOR) 10 MG tablet Take 1 tablet by mouth once daily   [DISCONTINUED] buPROPion (WELLBUTRIN XL) 150 MG 24 hr tablet Take 1 tablet (150 mg total) by mouth daily.   [DISCONTINUED] lisinopril (ZESTRIL) 20 MG tablet Take 1 tablet by mouth once daily   [DISCONTINUED] PARoxetine (PAXIL) 40 MG tablet TAKE 1 TABLET BY MOUTH ONCE DAILY IN THE MORNING   [DISCONTINUED] traZODone (DESYREL) 50 MG tablet TAKE 1 TABLET BY MOUTH EVERY DAY AT BEDTIME AS NEEDED FOR SLEEP   atorvastatin (LIPITOR) 10 MG tablet Take 1 tablet (10 mg total) by mouth daily.   buPROPion (WELLBUTRIN XL) 150 MG 24 hr tablet Take 1 tablet (150 mg total) by mouth daily.   lisinopril (ZESTRIL) 20 MG tablet Take 1 tablet (20 mg total) by mouth daily.   PARoxetine (PAXIL) 40 MG tablet Take 1 tablet (40 mg total) by mouth every morning.   traZODone (DESYREL) 50 MG tablet TAKE 1 TABLET BY MOUTH EVERY DAY AT BEDTIME AS NEEDED FOR SLEEP    Facility-Administered Encounter Medications as of 08/03/2022  Medication   0.9 %  sodium chloride infusion    Past Medical History:  Diagnosis Date   Alcoholism (HCC)    Aneurysm, cerebral    Anxiety    Arthritis    Depression    HLD (hyperlipidemia)    Hypertension    Stroke [facility])     Past Surgical History:  Procedure Laterality Date   ELBOW FRACTURE SURGERY Left    GYNECOLOGIC CRYOSURGERY      Family History  Problem Relation Age of Onset   Lung cancer Mother    Dementia Father    Brain cancer Brother    Lung cancer Brother    Breast cancer Maternal Grandmother    Breast cancer Paternal Aunt    Colon cancer Neg Hx     Social History   Socioeconomic History   Marital status: Single    Spouse name: Not on file   Number of children: 0   Years of education: Not on file   Highest education level: Not on file  Occupational History   Occupation: hair stylist  Tobacco Use   Smoking status: Former    Types: Cigarettes    Quit date: 09/24/2016    Years since quitting: 5.8   Smokeless tobacco: Never  Vaping Use   Vaping Use: Never used  Substance and Sexual Activity  Alcohol use: No    Comment: quit in AA  09-24-2016   Drug use: No   Sexual activity: Not on file  Other Topics Concern   Not on file  Social History Narrative   Pt doing well, (not doing drugs, alcohol, going to AA .     Social Determinants of Health   Financial Resource Strain: Not on file  Food Insecurity: Not on file  Transportation Needs: Not on file  Physical Activity: Not on file  Stress: Not on file  Social Connections: Not on file  Intimate Partner Violence: Not on file    ROS      Objective    BP 121/84   Pulse 74   Temp 98.1 F (36.7 C) (Oral)   Resp 16   Wt 134 lb 12.8 oz (61.1 kg)   SpO2 96%   BMI 24.66 kg/m   Physical Exam  {Labs (Optional):23779}    Assessment & Plan:   Problem List Items Addressed This Visit       Cardiovascular and Mediastinum    Essential hypertension - Primary   Relevant Medications   lisinopril (ZESTRIL) 20 MG tablet   atorvastatin (LIPITOR) 10 MG tablet     Other   Insomnia   Relevant Medications   traZODone (DESYREL) 50 MG tablet   Anxiety and depression   Relevant Medications   buPROPion (WELLBUTRIN XL) 150 MG 24 hr tablet   traZODone (DESYREL) 50 MG tablet   PARoxetine (PAXIL) 40 MG tablet   Hyperlipidemia   Relevant Medications   lisinopril (ZESTRIL) 20 MG tablet   atorvastatin (LIPITOR) 10 MG tablet   Other Visit Diagnoses     Back skin lesion       Relevant Orders   Ambulatory referral to Dermatology       No follow-ups on file.   Tommie Raymond, MD

## 2022-08-06 ENCOUNTER — Encounter: Payer: Self-pay | Admitting: Family Medicine

## 2022-08-16 DIAGNOSIS — H5213 Myopia, bilateral: Secondary | ICD-10-CM | POA: Diagnosis not present

## 2022-08-24 DIAGNOSIS — L821 Other seborrheic keratosis: Secondary | ICD-10-CM | POA: Diagnosis not present

## 2022-08-24 DIAGNOSIS — C4359 Malignant melanoma of other part of trunk: Secondary | ICD-10-CM | POA: Diagnosis not present

## 2022-08-24 DIAGNOSIS — D225 Melanocytic nevi of trunk: Secondary | ICD-10-CM | POA: Diagnosis not present

## 2022-09-27 DIAGNOSIS — C4359 Malignant melanoma of other part of trunk: Secondary | ICD-10-CM | POA: Diagnosis not present

## 2022-10-16 DIAGNOSIS — L02212 Cutaneous abscess of back [any part, except buttock]: Secondary | ICD-10-CM | POA: Diagnosis not present

## 2022-12-04 ENCOUNTER — Other Ambulatory Visit: Payer: Self-pay | Admitting: Family Medicine

## 2022-12-04 DIAGNOSIS — I10 Essential (primary) hypertension: Secondary | ICD-10-CM

## 2023-01-29 ENCOUNTER — Other Ambulatory Visit: Payer: Self-pay | Admitting: Family Medicine

## 2023-01-29 DIAGNOSIS — Z1231 Encounter for screening mammogram for malignant neoplasm of breast: Secondary | ICD-10-CM

## 2023-01-30 ENCOUNTER — Ambulatory Visit: Payer: Medicaid Other | Admitting: Family Medicine

## 2023-02-04 ENCOUNTER — Other Ambulatory Visit: Payer: Self-pay | Admitting: Family Medicine

## 2023-02-04 DIAGNOSIS — I10 Essential (primary) hypertension: Secondary | ICD-10-CM

## 2023-02-08 ENCOUNTER — Ambulatory Visit: Payer: Medicaid Other

## 2023-02-26 DIAGNOSIS — D2239 Melanocytic nevi of other parts of face: Secondary | ICD-10-CM | POA: Diagnosis not present

## 2023-02-26 DIAGNOSIS — D225 Melanocytic nevi of trunk: Secondary | ICD-10-CM | POA: Diagnosis not present

## 2023-02-26 DIAGNOSIS — L814 Other melanin hyperpigmentation: Secondary | ICD-10-CM | POA: Diagnosis not present

## 2023-02-26 DIAGNOSIS — D485 Neoplasm of uncertain behavior of skin: Secondary | ICD-10-CM | POA: Diagnosis not present

## 2023-02-26 DIAGNOSIS — L821 Other seborrheic keratosis: Secondary | ICD-10-CM | POA: Diagnosis not present

## 2023-02-27 ENCOUNTER — Encounter: Payer: Medicaid Other | Admitting: Physical Medicine & Rehabilitation

## 2023-04-01 ENCOUNTER — Other Ambulatory Visit: Payer: Self-pay | Admitting: Family Medicine

## 2023-04-01 DIAGNOSIS — I10 Essential (primary) hypertension: Secondary | ICD-10-CM

## 2023-04-12 ENCOUNTER — Ambulatory Visit
Admission: RE | Admit: 2023-04-12 | Discharge: 2023-04-12 | Disposition: A | Discharge status: Home / Self Care | Payer: Medicaid Other | Source: Ambulatory Visit | Attending: Family Medicine | Admitting: Family Medicine

## 2023-04-12 DIAGNOSIS — Z1231 Encounter for screening mammogram for malignant neoplasm of breast: Secondary | ICD-10-CM | POA: Diagnosis not present

## 2023-05-28 ENCOUNTER — Ambulatory Visit (INDEPENDENT_AMBULATORY_CARE_PROVIDER_SITE_OTHER): Payer: Medicaid Other | Admitting: Family Medicine

## 2023-05-28 ENCOUNTER — Encounter: Payer: Self-pay | Admitting: Family Medicine

## 2023-05-28 VITALS — BP 119/78 | HR 67 | Temp 98.4°F | Resp 16 | Ht 60.0 in | Wt 122.6 lb

## 2023-05-28 DIAGNOSIS — R519 Headache, unspecified: Secondary | ICD-10-CM | POA: Diagnosis not present

## 2023-05-28 DIAGNOSIS — W19XXXA Unspecified fall, initial encounter: Secondary | ICD-10-CM

## 2023-05-28 DIAGNOSIS — S0090XA Unspecified superficial injury of unspecified part of head, initial encounter: Secondary | ICD-10-CM

## 2023-05-28 NOTE — Progress Notes (Signed)
Established Patient Office Visit  Subjective    Patient ID: Kelsey Smith, female    DOB: [birth year]  Age: 62 y.o. MRN: [id number]  CC:  Chief Complaint  Patient presents with   Fall    HPI Kelsey Smith presents with complaint of fall. She reports that she was trying to go to the bathroom  in the dark and tripped over a shoe and hit her head on a piece of furniture and now is hurting and bleeding. Patient has had a stroke/aneurysm in the past.    Outpatient Encounter Medications as of 05/28/2023  Medication Sig   atorvastatin (LIPITOR) 10 MG tablet Take 1 tablet by mouth once daily   buPROPion (WELLBUTRIN XL) 150 MG 24 hr tablet Take 1 tablet (150 mg total) by mouth daily.   chlorhexidine (PERIDEX) 0.12 % solution Use as directed 15 mLs in the mouth or throat 2 (two) times daily.   cyanocobalamin 1000 MCG tablet Take 1 tablet (1,000 mcg total) by mouth daily.   gabapentin (NEURONTIN) 300 MG capsule Take 1 capsule (300 mg total) by mouth 3 (three) times daily.   hydrochlorothiazide (HYDRODIURIL) 25 MG tablet Take 1 tablet (25 mg total) by mouth daily.   lisinopril (ZESTRIL) 20 MG tablet Take 1 tablet by mouth once daily   Multiple Vitamin (MULTIVITAMIN) tablet Take 1 tablet by mouth daily.   oxybutynin (DITROPAN) 5 MG tablet Take 1 tablet (5 mg total) by mouth 2 (two) times daily.   PARoxetine (PAXIL) 40 MG tablet TAKE 1 TABLET BY MOUTH ONCE DAILY IN THE MORNING   traZODone (DESYREL) 50 MG tablet TAKE 1 TABLET BY MOUTH EVERY DAY AT BEDTIME AS NEEDED FOR SLEEP   triamcinolone cream (KENALOG) 0.1 % Apply 1 application. topically 2 (two) times daily.   Facility-Administered Encounter Medications as of 05/28/2023  Medication   0.9 %  sodium chloride infusion    Past Medical History:  Diagnosis Date   Alcoholism (HCC)    Aneurysm, cerebral    Anxiety    Arthritis    Depression    HLD (hyperlipidemia)    Hypertension    Stroke [facility])     Past Surgical History:   Procedure Laterality Date   ELBOW FRACTURE SURGERY Left    GYNECOLOGIC CRYOSURGERY      Family History  Problem Relation Age of Onset   Lung cancer Mother    Dementia Father    Brain cancer Brother    Lung cancer Brother    Breast cancer Maternal Grandmother    Breast cancer Paternal Aunt    Colon cancer Neg Hx     Social History   Socioeconomic History   Marital status: Single    Spouse name: Not on file   Number of children: 0   Years of education: Not on file   Highest education level: Not on file  Occupational History   Occupation: hair stylist  Tobacco Use   Smoking status: Former    Current packs/day: 0.00    Types: Cigarettes    Quit date: 09/24/2016    Years since quitting: 6.6   Smokeless tobacco: Never  Vaping Use   Vaping status: Never Used  Substance and Sexual Activity   Alcohol use: No    Comment: quit in AA  09-24-2016   Drug use: No   Sexual activity: Not on file  Other Topics Concern   Not on file  Social History Narrative   Pt doing well, (not doing drugs, alcohol,  going to AA .     Social Determinants of Health   Financial Resource Strain: Not on file  Food Insecurity: Not on file  Transportation Needs: Not on file  Physical Activity: Not on file  Stress: Not on file  Social Connections: Not on file  Intimate Partner Violence: Not on file    Review of Systems  Neurological:  Negative for speech change, weakness and headaches.  All other systems reviewed and are negative.       Objective    BP 119/78 (BP Location: Right Arm, Patient Position: Sitting, Cuff Size: Normal)   Pulse 67   Temp 98.4 F (36.9 C) (Oral)   Resp 16   Ht 5' (1.524 m)   Wt 122 lb 9.6 oz (55.6 kg)   SpO2 97%   BMI 23.94 kg/m   Physical Exam Vitals and nursing note reviewed.  Constitutional:      General: She is not in acute distress. HENT:     Head: Normocephalic.     Comments: Right jaw joint is ttp with decreased movement rom. Patient also has  dried blood on right pinna at lobe. Appears to be small lac on ear.     Right Ear: Tympanic membrane normal.     Left Ear: Tympanic membrane normal.  Eyes:     Pupils: Pupils are equal, round, and reactive to light.  Cardiovascular:     Rate and Rhythm: Normal rate and regular rhythm.  Pulmonary:     Effort: Pulmonary effort is normal.     Breath sounds: Normal breath sounds.  Abdominal:     Palpations: Abdomen is soft.     Tenderness: There is no abdominal tenderness.  Neurological:     General: No focal deficit present.     Mental Status: She is alert and oriented to person, place, and time.  Psychiatric:        Mood and Affect: Affect normal. Mood is anxious.        Speech: Speech normal.        Behavior: Behavior normal. Behavior is cooperative.        Thought Content: Thought content normal.         Assessment & Plan:   1. Fall, initial encounter Patient with fall. Denies symptoms. Referred to ED for further eval/mgt including possible imaging. Patient v.u Patient appears stable for transport by P.V.   Return if symptoms worsen or fail to improve.   Tommie Raymond, MD

## 2023-05-28 NOTE — Progress Notes (Signed)
.  fall

## 2023-06-08 ENCOUNTER — Other Ambulatory Visit: Payer: Self-pay | Admitting: Family Medicine

## 2023-06-08 DIAGNOSIS — I10 Essential (primary) hypertension: Secondary | ICD-10-CM

## 2023-07-23 ENCOUNTER — Other Ambulatory Visit: Payer: Self-pay | Admitting: Family Medicine

## 2023-07-23 DIAGNOSIS — I10 Essential (primary) hypertension: Secondary | ICD-10-CM

## 2023-09-01 ENCOUNTER — Other Ambulatory Visit: Payer: Self-pay | Admitting: Family Medicine

## 2023-09-01 DIAGNOSIS — G4709 Other insomnia: Secondary | ICD-10-CM

## 2023-09-01 DIAGNOSIS — I10 Essential (primary) hypertension: Secondary | ICD-10-CM

## 2023-10-01 DIAGNOSIS — D2239 Melanocytic nevi of other parts of face: Secondary | ICD-10-CM | POA: Diagnosis not present

## 2023-10-01 DIAGNOSIS — D225 Melanocytic nevi of trunk: Secondary | ICD-10-CM | POA: Diagnosis not present

## 2023-10-01 DIAGNOSIS — L300 Nummular dermatitis: Secondary | ICD-10-CM | POA: Diagnosis not present

## 2023-10-01 DIAGNOSIS — L821 Other seborrheic keratosis: Secondary | ICD-10-CM | POA: Diagnosis not present

## 2023-11-25 ENCOUNTER — Other Ambulatory Visit: Payer: Self-pay | Admitting: Family Medicine

## 2023-11-25 DIAGNOSIS — I10 Essential (primary) hypertension: Secondary | ICD-10-CM

## 2023-11-28 ENCOUNTER — Other Ambulatory Visit: Payer: Self-pay | Admitting: Family Medicine

## 2023-11-28 DIAGNOSIS — G4709 Other insomnia: Secondary | ICD-10-CM

## 2023-11-28 DIAGNOSIS — I10 Essential (primary) hypertension: Secondary | ICD-10-CM

## 2023-11-28 NOTE — Telephone Encounter (Signed)
 Copied from CRM [id number]. Topic: Clinical - Medication Refill >> Nov 28, 2023 12:56 PM Tiffany B wrote: Most Recent Primary Care Visit:  Provider: Georganna Skeans  Department: [facility]  Visit Type: SAME DAY  Date: 05/28/2023  Medication: Reason for CRM: lisinopril (ZESTRIL) 20 MG tablet, traZODone (DESYREL) 50 MG tablet, PARoxetine (PAXIL) 40 MG tablet   Has the patient contacted their pharmacy? Yes  (Agent: If yes, when and what did the pharmacy advise?) Contact PCP   Is this the correct pharmacy for this prescription? Yes  This is the patient's preferred pharmacy:  [facility] [address] [place], [place] - [address] [place] [address] Phone: [phone] Fax: [phone]   Has the prescription been filled recently? Yes  Is the patient out of the medication? Yes  Has the patient been seen for an appointment in the last year OR does the patient have an upcoming appointment? Yes  Can we respond through MyChart? No  Agent: Please be advised that Rx refills may take up to 3 business days. We ask that you follow-up with your pharmacy.

## 2023-11-29 MED ORDER — PAROXETINE HCL 40 MG PO TABS: 40.0000 mg | ORAL_TABLET | Freq: Every morning | ORAL | 0 refills | Status: DC

## 2023-11-29 MED ORDER — LISINOPRIL 20 MG PO TABS: 20.0000 mg | ORAL_TABLET | Freq: Every day | ORAL | 0 refills | Status: DC

## 2023-11-29 MED ORDER — TRAZODONE HCL 50 MG PO TABS: ORAL_TABLET | ORAL | 0 refills | Status: DC

## 2023-11-29 NOTE — Telephone Encounter (Signed)
 Requested Prescriptions  Pending Prescriptions Disp Refills   PARoxetine (PAXIL) 40 MG tablet 30 tablet 0    Sig: Take 1 tablet (40 mg total) by mouth every morning.     Psychiatry:  Antidepressants - SSRI Failed - 11/29/2023 10:24 AM      Failed - Valid encounter within last 6 months    Recent Outpatient Visits           6 months ago Fall, initial encounter   [place] [facility] at [facility], MD   1 year ago Essential hypertension   [place] [facility] at [facility], MD   1 year ago Essential hypertension   [place] [facility] at [facility], MD   2 years ago Well woman exam with routine gynecological exam   [place] [facility] at [facility], Lauris Poag, MD   2 years ago Rash and nonspecific skin eruption   [place] [facility] at [facility], Lauris Poag, MD              Passed - Completed PHQ-2 or PHQ-9 in the last 360 days       traZODone (DESYREL) 50 MG tablet 30 tablet 0    Sig: TAKE 1 TABLET BY MOUTH EVERY DAY AT BEDTIME AS NEEDED FOR SLEEP     Psychiatry: Antidepressants - Serotonin Modulator Failed - 11/29/2023 10:24 AM      Failed - Valid encounter within last 6 months    Recent Outpatient Visits           6 months ago Fall, initial encounter   [place] [facility] at [facility], MD   1 year ago Essential hypertension   [place] [facility] at [facility], MD   1 year ago Essential hypertension   [place] [facility] at [facility], MD   2 years ago Well woman exam with routine gynecological exam   [place] [facility] at [facility], Lauris Poag, MD   2 years ago Rash and nonspecific skin eruption    Primary Care at [facility], Lauris Poag, MD              Passed - Completed PHQ-2 or PHQ-9 in the last 360 days       lisinopril (ZESTRIL) 20 MG tablet 30 tablet 0    Sig:  Take 1 tablet (20 mg total) by mouth daily.     Cardiovascular:  ACE Inhibitors Failed - 11/29/2023 10:24 AM      Failed - Cr in normal range and within 180 days    Creat  Date Value Ref Range Status  10/29/2016 0.74 0.50 - 1.05 mg/dL Final    Comment:      For patients > or = 63 years of age: The upper reference limit for Creatinine is approximately 13% higher for people identified as African-American.      Creatinine, Ser  Date Value Ref Range Status  09/05/2021 0.77 0.57 - 1.00 mg/dL Final         Failed - K in normal range and within 180 days    Potassium  Date Value Ref Range Status  09/05/2021 5.2 3.5 - 5.2 mmol/L Final         Failed - Valid encounter within last 6 months    Recent Outpatient Visits           6 months ago Fall,  initial encounter   [place] [facility] at [facility], MD   1 year ago Essential hypertension   [place] [facility] at [facility], MD   1 year ago Essential hypertension   [place] [facility] at [facility] MD   2 years ago Well woman exam with routine gynecological exam   [place] [facility] at [facility], MD   2 years ago Rash and nonspecific skin eruption    Primary Care at [facility], Lauris Poag, MD              Passed - Patient is not pregnant      Passed - Last BP in normal range    BP Readings from Last 1 Encounters:  05/28/23 119/78          Courtesy refill.

## 2023-12-30 ENCOUNTER — Other Ambulatory Visit: Payer: Self-pay | Admitting: Family Medicine

## 2023-12-30 DIAGNOSIS — G4709 Other insomnia: Secondary | ICD-10-CM

## 2023-12-30 DIAGNOSIS — I10 Essential (primary) hypertension: Secondary | ICD-10-CM

## 2024-01-01 DIAGNOSIS — L821 Other seborrheic keratosis: Secondary | ICD-10-CM | POA: Diagnosis not present

## 2024-01-01 DIAGNOSIS — D225 Melanocytic nevi of trunk: Secondary | ICD-10-CM | POA: Diagnosis not present

## 2024-01-01 DIAGNOSIS — L814 Other melanin hyperpigmentation: Secondary | ICD-10-CM | POA: Diagnosis not present

## 2024-01-01 DIAGNOSIS — D2239 Melanocytic nevi of other parts of face: Secondary | ICD-10-CM | POA: Diagnosis not present

## 2024-01-13 ENCOUNTER — Other Ambulatory Visit: Payer: Self-pay | Admitting: Family Medicine

## 2024-01-13 DIAGNOSIS — I10 Essential (primary) hypertension: Secondary | ICD-10-CM

## 2024-01-13 DIAGNOSIS — G4709 Other insomnia: Secondary | ICD-10-CM

## 2024-01-15 ENCOUNTER — Other Ambulatory Visit: Payer: Self-pay | Admitting: Family Medicine

## 2024-01-15 DIAGNOSIS — I10 Essential (primary) hypertension: Secondary | ICD-10-CM

## 2024-01-15 DIAGNOSIS — G4709 Other insomnia: Secondary | ICD-10-CM

## 2024-01-15 NOTE — Telephone Encounter (Signed)
 Copied from CRM [id number]. Topic: Clinical - Medication Refill >> Jan 15, 2024 11:45 AM Rosamond Comes wrote: Patient calling requesting medication refill.  Most Recent Primary Care Visit:  Provider: Abraham Abo  Department: [facility]  Visit Type: SAME DAY  Date: 05/28/2023  Medication:  lisinopril  (ZESTRIL ) 20 MG tablet  PARoxetine  (PAXIL ) 40 MG tablet traZODone  (DESYREL ) 50 MG tablet atorvastatin  (LIPITOR) 10 MG tablet  Has the patient contacted their pharmacy? Yes Pharmacy stated they have sent a message to the provider and are waiting for a response.  Is this the correct pharmacy for this prescription? Yes If no, delete pharmacy and type the correct one.  This is the patient's preferred pharmacy:  [facility] [address] [place], [place] - [address] [place] [address] Phone: [phone] Fax: [phone]   Has the prescription been filled recently? Yes  Is the patient out of the medication? Yes  Has the patient been seen for an appointment in the last year OR does the patient have an upcoming appointment? No  Can we respond through MyChart? No  Agent: Please be advised that Rx refills may take up to 3 business days. We ask that you follow-up with your pharmacy.

## 2024-01-15 NOTE — Telephone Encounter (Signed)
 Copied from CRM [id number]. Topic: Clinical - Medication Refill >> Jan 15, 2024 11:45 AM Rosamond Comes wrote: Patient calling requesting medication refill.  Most Recent Primary Care Visit:  Provider: Abraham Abo  [facility]: [facility]  Visit Type: SAME DAY  Date: 05/28/2023  Medication:  lisinopril  (ZESTRIL ) 20 MG tablet  PARoxetine  (PAXIL ) 40 MG tablet traZODone  (DESYREL ) 50 MG tablet atorvastatin  (LIPITOR) 10 MG tablet  Has the patient contacted their pharmacy? Yes Pharmacy stated they have sent a message to the provider and are waiting for a response.  Is this the correct pharmacy for this prescription? Yes If no, delete pharmacy and type the correct one.  This is the patient's preferred pharmacy:  [facility] [address] [place], [place] - [address] [place] [address] Phone: [phone] Fax: [phone]   Has the prescription been filled recently? Yes  Is the patient out of the medication? Yes  Has the patient been seen for an appointment in the last year OR does the patient have an upcoming appointment? No  Can we respond through MyChart? No  Agent: Please be advised that Rx refills may take up to 3 business days. We ask that you follow-up with your pharmacy. >> Jan 15, 2024 11:50 AM Rosamond Comes wrote: These medications are pending.   Patient would like an update regarding these refills.

## 2024-01-16 NOTE — Telephone Encounter (Signed)
 Requested medication (s) are due for refill today: yes  Requested medication (s) are on the active medication list: yes  Last refill:  12/17/23  Future visit scheduled: no  Notes to clinic:  Unable to refill per protocol, courtesy refill already given, routing for provider approval.      Requested Prescriptions  Pending Prescriptions Disp Refills   lisinopril  (ZESTRIL ) 20 MG tablet 30 tablet 0    Sig: Take 1 tablet (20 mg total) by mouth daily.     Cardiovascular:  ACE Inhibitors Failed - 01/16/2024 10:38 AM      Failed - Cr in normal range and within 180 days    Creat  Date Value Ref Range Status  10/29/2016 0.74 0.50 - 1.05 mg/dL Final    Comment:      For patients > or = 63 years of age: The upper reference limit for Creatinine is approximately 13% higher for people identified as African-American.      Creatinine, Ser  Date Value Ref Range Status  09/05/2021 0.77 0.57 - 1.00 mg/dL Final         Failed - K in normal range and within 180 days    Potassium  Date Value Ref Range Status  09/05/2021 5.2 3.5 - 5.2 mmol/L Final         Failed - Valid encounter within last 6 months    Recent Outpatient Visits           7 months ago Fall, initial encounter   [place] [facility], MD   1 year ago Essential hypertension   [place] [facility], MD   2 years ago Essential hypertension   [place] [facility], MD   2 years ago Well woman exam with routine gynecological exam   [place] [facility], Ray Caffey, MD   2 years ago Rash and nonspecific skin eruption   [place] [facility], Ray Caffey, MD              Passed - Patient is not pregnant      Passed - Last BP in normal range    BP Readings from Last 1 Encounters:  05/28/23 119/78          PARoxetine  (PAXIL ) 40 MG tablet 30 tablet 0    Sig: Take 1  tablet (40 mg total) by mouth every morning.     Psychiatry:  Antidepressants - SSRI Failed - 01/16/2024 10:38 AM      Failed - Valid encounter within last 6 months    Recent Outpatient Visits           7 months ago Fall, initial encounter   [place] [facility], MD   1 year ago Essential hypertension   [place] [facility], MD   2 years ago Essential hypertension   [place] [facility], MD   2 years ago Well woman exam with routine gynecological exam   [place] [facility], MD   2 years ago Rash and nonspecific skin eruption   [place] [facility], Ray Caffey, MD              Passed - Completed PHQ-2 or PHQ-9 in the last 360 days  atorvastatin  (LIPITOR) 10 MG tablet 90 tablet 0    Sig: Take 1 tablet (10 mg total) by mouth daily.     Cardiovascular:  Antilipid - Statins Failed - 01/16/2024 10:38 AM      Failed - Valid encounter within last 12 months    Recent Outpatient Visits           7 months ago Fall, initial encounter   [place] [facility], MD   1 year ago Essential hypertension   [place] [facility] at [facility], MD   2 years ago Essential hypertension   [place] [facility], MD   2 years ago Well woman exam with routine gynecological exam   [place] [facility], Ray Caffey, MD   2 years ago Rash and nonspecific skin eruption   [place] [facility], Ray Caffey, MD              Failed - Lipid Panel in normal range within the last 12 months    Cholesterol, Total  Date Value Ref Range Status  09/05/2021 157 100 - 199 mg/dL Final   LDL Chol Calc (NIH)  Date Value Ref Range Status  09/05/2021 72 0 - 99 mg/dL Final   HDL  Date Value  Ref Range Status  09/05/2021 73 >39 mg/dL Final   Triglycerides  Date Value Ref Range Status  09/05/2021 60 0 - 149 mg/dL Final         Passed - Patient is not pregnant       traZODone  (DESYREL ) 50 MG tablet 30 tablet 0    Sig: TAKE 1 TABLET BY MOUTH EVERY DAY AT BEDTIME AS NEEDED FOR SLEEP     Psychiatry: Antidepressants - Serotonin Modulator Failed - 01/16/2024 10:38 AM      Failed - Valid encounter within last 6 months    Recent Outpatient Visits           7 months ago Fall, initial encounter   [place] [facility], MD   1 year ago Essential hypertension   [place] [facility], MD   2 years ago Essential hypertension   [place] [facility], MD   2 years ago Well woman exam with routine gynecological exam   [place] [facility], MD   2 years ago Rash and nonspecific skin eruption    [facility] at [facility], Ray Caffey, MD              Passed - Completed PHQ-2 or PHQ-9 in the last 360 days

## 2024-01-17 ENCOUNTER — Other Ambulatory Visit: Payer: Self-pay | Admitting: Family Medicine

## 2024-01-17 DIAGNOSIS — G4709 Other insomnia: Secondary | ICD-10-CM

## 2024-01-17 DIAGNOSIS — I10 Essential (primary) hypertension: Secondary | ICD-10-CM

## 2024-01-23 ENCOUNTER — Ambulatory Visit (INDEPENDENT_AMBULATORY_CARE_PROVIDER_SITE_OTHER): Admitting: Family Medicine

## 2024-01-23 ENCOUNTER — Encounter: Payer: Self-pay | Admitting: Family Medicine

## 2024-01-23 VITALS — BP 148/100 | HR 72 | Wt 123.8 lb

## 2024-01-23 DIAGNOSIS — I10 Essential (primary) hypertension: Secondary | ICD-10-CM

## 2024-01-23 DIAGNOSIS — F419 Anxiety disorder, unspecified: Secondary | ICD-10-CM

## 2024-01-23 DIAGNOSIS — F32A Depression, unspecified: Secondary | ICD-10-CM | POA: Diagnosis not present

## 2024-01-23 DIAGNOSIS — G4709 Other insomnia: Secondary | ICD-10-CM | POA: Diagnosis not present

## 2024-01-23 DIAGNOSIS — E785 Hyperlipidemia, unspecified: Secondary | ICD-10-CM

## 2024-01-23 MED ORDER — LISINOPRIL 20 MG PO TABS: 20.0000 mg | ORAL_TABLET | Freq: Every day | ORAL | 1 refills | Status: DC

## 2024-01-23 MED ORDER — ATORVASTATIN CALCIUM 10 MG PO TABS: 10.0000 mg | ORAL_TABLET | Freq: Every day | ORAL | 1 refills | Status: DC

## 2024-01-23 MED ORDER — PAROXETINE HCL 40 MG PO TABS: 40.0000 mg | ORAL_TABLET | Freq: Every morning | ORAL | 1 refills | Status: DC

## 2024-01-23 MED ORDER — BUPROPION HCL ER (XL) 150 MG PO TB24: 150.0000 mg | ORAL_TABLET | Freq: Every day | ORAL | 1 refills | Status: DC

## 2024-01-23 MED ORDER — HYDROCHLOROTHIAZIDE 25 MG PO TABS: 25.0000 mg | ORAL_TABLET | Freq: Every day | ORAL | 1 refills | Status: DC

## 2024-01-23 MED ORDER — TRAZODONE HCL 50 MG PO TABS: ORAL_TABLET | ORAL | 1 refills | Status: DC

## 2024-01-23 NOTE — Progress Notes (Unsigned)
 Kelsey Smith

## 2024-01-28 ENCOUNTER — Encounter: Payer: Self-pay | Admitting: Family Medicine

## 2024-04-23 DIAGNOSIS — J189 Pneumonia, unspecified organism: Secondary | ICD-10-CM | POA: Diagnosis not present

## 2024-04-24 DIAGNOSIS — J189 Pneumonia, unspecified organism: Secondary | ICD-10-CM | POA: Diagnosis not present

## 2024-04-25 DIAGNOSIS — J189 Pneumonia, unspecified organism: Secondary | ICD-10-CM | POA: Diagnosis not present

## 2024-05-04 ENCOUNTER — Telehealth: Payer: Self-pay | Admitting: Physician Assistant

## 2024-05-04 NOTE — Telephone Encounter (Signed)
CRM

## 2024-05-06 ENCOUNTER — Ambulatory Visit (INDEPENDENT_AMBULATORY_CARE_PROVIDER_SITE_OTHER): Admitting: Family Medicine

## 2024-05-06 ENCOUNTER — Telehealth: Payer: Self-pay | Admitting: Family Medicine

## 2024-05-06 VITALS — BP 134/84 | HR 86 | Ht 60.0 in | Wt 123.4 lb

## 2024-05-06 DIAGNOSIS — S0101XA Laceration without foreign body of scalp, initial encounter: Secondary | ICD-10-CM | POA: Diagnosis not present

## 2024-05-06 DIAGNOSIS — Z4802 Encounter for removal of sutures: Secondary | ICD-10-CM | POA: Diagnosis not present

## 2024-05-06 DIAGNOSIS — S0101XD Laceration without foreign body of scalp, subsequent encounter: Secondary | ICD-10-CM

## 2024-05-06 NOTE — Telephone Encounter (Signed)
 Transportation Voucher completed.   Pick up date and time: 05/06/2024 1:50 pm.  Pick up address: [address] [place] [address]   Drop off address: [address], [place], [place] [address]

## 2024-05-06 NOTE — Telephone Encounter (Signed)
 Transportation Voucher completed.   Pick up date and time: 05/06/2024 2:29 pm.  Pick up address:  [address], [place], [place] [address]   Drop off address: [address] [place] [address]

## 2024-05-08 ENCOUNTER — Encounter: Payer: Self-pay | Admitting: Family Medicine

## 2024-05-08 NOTE — Progress Notes (Signed)
 Established Patient Office Visit  Subjective    Patient ID: Kelsey Smith, female    DOB: 03/06/1961  Age: 63 y.o. MRN: [id number]  CC:  Chief Complaint  Patient presents with   Hospitalization Follow-up    Pt needs staples removed    HPI Kelsey  L Smith presents for removal of staples. She had 3 staples place in a scalp laceration about 2 weeks ago at the hospital.   Outpatient Encounter Medications as of 05/06/2024  Medication Sig   atorvastatin  (LIPITOR) 10 MG tablet Take 1 tablet (10 mg total) by mouth daily.   buPROPion  (WELLBUTRIN  XL) 150 MG 24 hr tablet Take 1 tablet (150 mg total) by mouth daily.   chlorhexidine  (PERIDEX ) 0.12 % solution Use as directed 15 mLs in the mouth or throat 2 (two) times daily.   cyanocobalamin  1000 MCG tablet Take 1 tablet (1,000 mcg total) by mouth daily.   gabapentin  (NEURONTIN ) 300 MG capsule Take 1 capsule (300 mg total) by mouth 3 (three) times daily.   hydrochlorothiazide  (HYDRODIURIL ) 25 MG tablet Take 1 tablet (25 mg total) by mouth daily.   lisinopril  (ZESTRIL ) 20 MG tablet Take 1 tablet (20 mg total) by mouth daily.   Multiple Vitamin (MULTIVITAMIN) tablet Take 1 tablet by mouth daily.   oxybutynin  (DITROPAN ) 5 MG tablet Take 1 tablet (5 mg total) by mouth 2 (two) times daily.   PARoxetine  (PAXIL ) 40 MG tablet Take 1 tablet (40 mg total) by mouth every morning.   traZODone  (DESYREL ) 50 MG tablet TAKE 1 TABLET BY MOUTH EVERY DAY AT BEDTIME AS NEEDED FOR SLEEP   triamcinolone  cream (KENALOG ) 0.1 % Apply 1 application. topically 2 (two) times daily.   Facility-Administered Encounter Medications as of 05/06/2024  Medication   0.9 %  sodium chloride  infusion    Past Medical History:  Diagnosis Date   Alcoholism (HCC)    Aneurysm, cerebral    Anxiety    Arthritis    Depression    HLD (hyperlipidemia)    Hypertension    Stroke [facility])     Past Surgical History:  Procedure Laterality Date   ELBOW FRACTURE SURGERY Left     GYNECOLOGIC CRYOSURGERY      Family History  Problem Relation Age of Onset   Lung cancer Mother    Dementia Father    Brain cancer Brother    Lung cancer Brother    Breast cancer Maternal Grandmother    Breast cancer Paternal Aunt    Colon cancer Neg Hx     Social History   Socioeconomic History   Marital status: Single    Spouse name: Not on file   Number of children: 0   Years of education: Not on file   Highest education level: Not on file  Occupational History   Occupation: hair stylist  Tobacco Use   Smoking status: Former    Current packs/day: 0.00    Types: Cigarettes    Quit date: 09/24/2016    Years since quitting: 7.6   Smokeless tobacco: Never  Vaping Use   Vaping status: Never Used  Substance and Sexual Activity   Alcohol use: No    Comment: quit in AA  09-24-2016   Drug use: No   Sexual activity: Not on file  Other Topics Concern   Not on file  Social History Narrative   Pt doing well, (not doing drugs, alcohol, going to AA .     Social Drivers of Corporate investment banker  Strain: Not on file  Food Insecurity: Low Risk  (04/24/2024)   Received from [facility]   Hunger Vital Sign    Within the past 12 months, you worried that your food would run out before you got money to buy more: Never true    Within the past 12 months, the food you bought just didn't last and you didn't have money to get more. : Never true  Transportation Needs: No Transportation Needs (04/24/2024)   Received from [facility]    In the past 12 months, has lack of reliable transportation kept you from medical appointments, meetings, work or from getting things needed for daily living? : No  Physical Activity: Not on file  Stress: Not on file  Social Connections: Not on file  Intimate Partner Violence: Not on file    Review of Systems  All other systems reviewed and are negative.       Objective    BP 134/84   Pulse 86   Ht 5' (1.524 m)   Wt 123 lb  6.4 oz (56 kg)   SpO2 98%   BMI 24.10 kg/m   Physical Exam Vitals and nursing note reviewed.  Constitutional:      General: She is not in acute distress. HENT:     Head: Normocephalic.     Comments: Healing scalp laceration with scabbing and 3 staples noted.  Cardiovascular:     Rate and Rhythm: Normal rate and regular rhythm.  Pulmonary:     Effort: Pulmonary effort is normal.     Breath sounds: Normal breath sounds.  Neurological:     General: No focal deficit present.     Mental Status: She is alert and oriented to person, place, and time.         Assessment & Plan:   Laceration of scalp, subsequent encounter  Encounter for removal of staples     No follow-ups on file.   Tanda Raguel SQUIBB, MD

## 2024-07-15 DIAGNOSIS — D225 Melanocytic nevi of trunk: Secondary | ICD-10-CM | POA: Diagnosis not present

## 2024-07-15 DIAGNOSIS — Z85820 Personal history of malignant melanoma of skin: Secondary | ICD-10-CM | POA: Diagnosis not present

## 2024-07-15 DIAGNOSIS — D2239 Melanocytic nevi of other parts of face: Secondary | ICD-10-CM | POA: Diagnosis not present

## 2024-07-15 DIAGNOSIS — L820 Inflamed seborrheic keratosis: Secondary | ICD-10-CM | POA: Diagnosis not present

## 2024-07-15 DIAGNOSIS — L814 Other melanin hyperpigmentation: Secondary | ICD-10-CM | POA: Diagnosis not present

## 2024-07-27 ENCOUNTER — Ambulatory Visit (INDEPENDENT_AMBULATORY_CARE_PROVIDER_SITE_OTHER): Admitting: Family Medicine

## 2024-07-27 ENCOUNTER — Encounter: Payer: Self-pay | Admitting: Family Medicine

## 2024-07-27 ENCOUNTER — Other Ambulatory Visit: Payer: Self-pay | Admitting: Family Medicine

## 2024-07-27 VITALS — BP 165/96 | HR 68 | Ht 60.0 in | Wt 127.4 lb

## 2024-07-27 DIAGNOSIS — Z1231 Encounter for screening mammogram for malignant neoplasm of breast: Secondary | ICD-10-CM

## 2024-07-27 DIAGNOSIS — F419 Anxiety disorder, unspecified: Secondary | ICD-10-CM | POA: Diagnosis not present

## 2024-07-27 DIAGNOSIS — G4709 Other insomnia: Secondary | ICD-10-CM

## 2024-07-27 DIAGNOSIS — I10 Essential (primary) hypertension: Secondary | ICD-10-CM

## 2024-07-27 DIAGNOSIS — F32A Depression, unspecified: Secondary | ICD-10-CM | POA: Diagnosis not present

## 2024-07-27 DIAGNOSIS — E785 Hyperlipidemia, unspecified: Secondary | ICD-10-CM

## 2024-07-27 MED ORDER — ATORVASTATIN CALCIUM 10 MG PO TABS: 10.0000 mg | ORAL_TABLET | Freq: Every day | ORAL | 1 refills | Status: AC

## 2024-07-27 MED ORDER — HYDROCHLOROTHIAZIDE 25 MG PO TABS: 25.0000 mg | ORAL_TABLET | Freq: Every day | ORAL | 1 refills | Status: AC

## 2024-07-27 MED ORDER — BUPROPION HCL ER (XL) 150 MG PO TB24: 150.0000 mg | ORAL_TABLET | Freq: Every day | ORAL | 1 refills | Status: AC

## 2024-07-27 MED ORDER — LISINOPRIL 20 MG PO TABS: 20.0000 mg | ORAL_TABLET | Freq: Every day | ORAL | 1 refills | Status: AC

## 2024-07-27 MED ORDER — TRAZODONE HCL 50 MG PO TABS: ORAL_TABLET | ORAL | 1 refills | Status: AC

## 2024-07-27 MED ORDER — PAROXETINE HCL 40 MG PO TABS: 60.0000 mg | ORAL_TABLET | Freq: Every morning | ORAL | 1 refills | Status: DC

## 2024-07-28 ENCOUNTER — Encounter: Payer: Self-pay | Admitting: Family Medicine

## 2024-07-28 NOTE — Progress Notes (Signed)
 Established Patient Office Visit  Subjective    Patient ID: Kelsey Smith  Smith Kelsey, female    DOB: 1961-01-23  Age: 63 y.o. MRN: [id number]  CC:  Chief Complaint  Patient presents with   Medical Management of Chronic Issues    HPI Mega  L Coia presents for routine follow up of chronic med issues including hypertension, anxiety/depression. Patient reports med compliance and denies acute complaints.   Outpatient Encounter Medications as of 07/27/2024  Medication Sig   [DISCONTINUED] atorvastatin  (LIPITOR) 10 MG tablet Take 1 tablet (10 mg total) by mouth daily.   [DISCONTINUED] buPROPion  (WELLBUTRIN  XL) 150 MG 24 hr tablet Take 1 tablet (150 mg total) by mouth daily.   [DISCONTINUED] lisinopril  (ZESTRIL ) 20 MG tablet Take 1 tablet (20 mg total) by mouth daily.   [DISCONTINUED] PARoxetine  (PAXIL ) 40 MG tablet Take 1 tablet (40 mg total) by mouth every morning.   [DISCONTINUED] traZODone  (DESYREL ) 50 MG tablet TAKE 1 TABLET BY MOUTH EVERY DAY AT BEDTIME AS NEEDED FOR SLEEP   atorvastatin  (LIPITOR) 10 MG tablet Take 1 tablet (10 mg total) by mouth daily.   buPROPion  (WELLBUTRIN  XL) 150 MG 24 hr tablet Take 1 tablet (150 mg total) by mouth daily.   chlorhexidine  (PERIDEX ) 0.12 % solution Use as directed 15 mLs in the mouth or throat 2 (two) times daily.   cyanocobalamin  1000 MCG tablet Take 1 tablet (1,000 mcg total) by mouth daily.   gabapentin  (NEURONTIN ) 300 MG capsule Take 1 capsule (300 mg total) by mouth 3 (three) times daily.   hydrochlorothiazide  (HYDRODIURIL ) 25 MG tablet Take 1 tablet (25 mg total) by mouth daily.   lisinopril  (ZESTRIL ) 20 MG tablet Take 1 tablet (20 mg total) by mouth daily.   Multiple Vitamin (MULTIVITAMIN) tablet Take 1 tablet by mouth daily.   oxybutynin  (DITROPAN ) 5 MG tablet Take 1 tablet (5 mg total) by mouth 2 (two) times daily.   PARoxetine  (PAXIL ) 40 MG tablet Take 1.5 tablets (60 mg total) by mouth every morning.   traZODone  (DESYREL ) 50 MG tablet TAKE  1 TABLET BY MOUTH EVERY DAY AT BEDTIME AS NEEDED FOR SLEEP   triamcinolone  cream (KENALOG ) 0.1 % Apply 1 application. topically 2 (two) times daily.   [DISCONTINUED] hydrochlorothiazide  (HYDRODIURIL ) 25 MG tablet Take 1 tablet (25 mg total) by mouth daily.   Facility-Administered Encounter Medications as of 07/27/2024  Medication   0.9 %  sodium chloride  infusion    Past Medical History:  Diagnosis Date   Alcoholism (HCC)    Aneurysm, cerebral    Anxiety    Arthritis    Depression    HLD (hyperlipidemia)    Hypertension    Stroke [facility])     Past Surgical History:  Procedure Laterality Date   ELBOW FRACTURE SURGERY Left    GYNECOLOGIC CRYOSURGERY      Family History  Problem Relation Age of Onset   Lung cancer Mother    Dementia Father    Brain cancer Brother    Lung cancer Brother    Breast cancer Maternal Grandmother    Breast cancer Paternal Aunt    Colon cancer Neg Hx     Social History   Socioeconomic History   Marital status: Single    Spouse name: Not on file   Number of children: 0   Years of education: Not on file   Highest education level: Not on file  Occupational History   Occupation: hair stylist  Tobacco Use   Smoking status: Former  Current packs/day: 0.00    Types: Cigarettes    Quit date: 09/24/2016    Years since quitting: 7.8   Smokeless tobacco: Never  Vaping Use   Vaping status: Never Used  Substance and Sexual Activity   Alcohol use: No    Comment: quit in AA  09-24-2016   Drug use: No   Sexual activity: Not on file  Other Topics Concern   Not on file  Social History Narrative   Pt doing well, (not doing drugs, alcohol, going to AA .     Social Drivers of Corporate Investment Banker Strain: Low Risk  (07/27/2024)   Overall Financial Resource Strain (CARDIA)    Difficulty of Paying Living Expenses: Not very hard  Food Insecurity: Low Risk  (04/24/2024)   Received from [facility]   Hunger Vital Sign    Within the past 12  months, you worried that your food would run out before you got money to buy more: Never true    Within the past 12 months, the food you bought just didn't last and you didn't have money to get more. : Never true  Transportation Needs: No Transportation Needs (04/24/2024)   Received from [facility]    In the past 12 months, has lack of reliable transportation kept you from medical appointments, meetings, work or from getting things needed for daily living? : No  Physical Activity: Insufficiently Active (07/27/2024)   Exercise Vital Sign    Days of Exercise per Week: 1 day    Minutes of Exercise per Session: 10 min  Stress: Stress Concern Present (07/27/2024)   [employer] of Occupational Health - Occupational Stress Questionnaire    Feeling of Stress: To some extent  Social Connections: Moderately Isolated (07/27/2024)   Social Connection and Isolation Panel    Frequency of Communication with Friends and Family: More than three times a week    Frequency of Social Gatherings with Friends and Family: More than three times a week    Attends Religious Services: 1 to 4 times per year    Active Member of [employer] or Organizations: No    Attends Banker Meetings: Never    Marital Status: Never married  Intimate Partner Violence: Not At Risk (07/27/2024)   Humiliation, Afraid, Rape, and Kick questionnaire    Fear of Current or Ex-Partner: No    Emotionally Abused: No    Physically Abused: No    Sexually Abused: No    Review of Systems  All other systems reviewed and are negative.       Objective    BP (!) 165/96   Pulse 68   Ht 5' (1.524 m)   Wt 127 lb 6.4 oz (57.8 kg)   SpO2 99%   BMI 24.88 kg/m   Physical Exam Vitals and nursing note reviewed.  Constitutional:      General: She is not in acute distress. Cardiovascular:     Rate and Rhythm: Normal rate and regular rhythm.  Pulmonary:     Effort: Pulmonary effort is normal.     Breath  sounds: Normal breath sounds.  Abdominal:     Palpations: Abdomen is soft.     Tenderness: There is no abdominal tenderness.  Neurological:     General: No focal deficit present.     Mental Status: She is alert and oriented to person, place, and time.  Psychiatric:        Mood and Affect: Mood is  anxious.        Behavior: Behavior normal.         Assessment & Plan:   Essential hypertension -     hydroCHLOROthiazide ; Take 1 tablet (25 mg total) by mouth daily.  Dispense: 90 tablet; Refill: 1 -     Lisinopril ; Take 1 tablet (20 mg total) by mouth daily.  Dispense: 90 tablet; Refill: 1  Hyperlipidemia, unspecified hyperlipidemia type  Anxiety and depression  Other insomnia -     traZODone  HCl; TAKE 1 TABLET BY MOUTH EVERY DAY AT BEDTIME AS NEEDED FOR SLEEP  Dispense: 90 tablet; Refill: 1  Other orders -     PARoxetine  HCl; Take 1.5 tablets (60 mg total) by mouth every morning.  Dispense: 135 tablet; Refill: 1 -     Atorvastatin  Calcium ; Take 1 tablet (10 mg total) by mouth daily.  Dispense: 90 tablet; Refill: 1 -     buPROPion  HCl ER (XL); Take 1 tablet (150 mg total) by mouth daily.  Dispense: 90 tablet; Refill: 1     Return in about 6 months (around 01/24/2025) for follow up, chronic med issues.   Tanda Raguel SQUIBB, MD

## 2024-07-30 ENCOUNTER — Other Ambulatory Visit: Payer: Self-pay

## 2024-08-18 ENCOUNTER — Ambulatory Visit

## 2024-08-18 DIAGNOSIS — Z1231 Encounter for screening mammogram for malignant neoplasm of breast: Secondary | ICD-10-CM

## 2024-10-26 ENCOUNTER — Other Ambulatory Visit: Payer: Self-pay | Admitting: Family Medicine

## 2025-01-26 ENCOUNTER — Ambulatory Visit: Admitting: Family Medicine
